# Patient Record
Sex: Female | Born: 1945 | Race: White | Hispanic: No | Smoking: Former smoker
Health system: Southern US, Community
[De-identification: ages and names within clinical notes are randomized; demographics above are authoritative.]

## PROBLEM LIST (undated history)

## (undated) DIAGNOSIS — K829 Disease of gallbladder, unspecified: Secondary | ICD-10-CM

## (undated) DIAGNOSIS — M1990 Unspecified osteoarthritis, unspecified site: Secondary | ICD-10-CM

## (undated) DIAGNOSIS — E785 Hyperlipidemia, unspecified: Secondary | ICD-10-CM

## (undated) DIAGNOSIS — H5440 Blindness, one eye, unspecified eye: Secondary | ICD-10-CM

## (undated) DIAGNOSIS — I639 Cerebral infarction, unspecified: Secondary | ICD-10-CM

## (undated) DIAGNOSIS — I10 Essential (primary) hypertension: Secondary | ICD-10-CM

## (undated) DIAGNOSIS — I359 Nonrheumatic aortic valve disorder, unspecified: Secondary | ICD-10-CM

## (undated) DIAGNOSIS — F32A Depression, unspecified: Secondary | ICD-10-CM

## (undated) DIAGNOSIS — Z8639 Personal history of other endocrine, nutritional and metabolic disease: Secondary | ICD-10-CM

## (undated) DIAGNOSIS — K589 Irritable bowel syndrome without diarrhea: Secondary | ICD-10-CM

## (undated) DIAGNOSIS — IMO0001 Reserved for inherently not codable concepts without codable children: Secondary | ICD-10-CM

## (undated) DIAGNOSIS — R5382 Chronic fatigue, unspecified: Secondary | ICD-10-CM

## (undated) DIAGNOSIS — F329 Major depressive disorder, single episode, unspecified: Secondary | ICD-10-CM

## (undated) DIAGNOSIS — Z531 Procedure and treatment not carried out because of patient's decision for reasons of belief and group pressure: Secondary | ICD-10-CM

## (undated) DIAGNOSIS — K8020 Calculus of gallbladder without cholecystitis without obstruction: Secondary | ICD-10-CM

## (undated) DIAGNOSIS — D75839 Thrombocytosis, unspecified: Secondary | ICD-10-CM

## (undated) DIAGNOSIS — N289 Disorder of kidney and ureter, unspecified: Secondary | ICD-10-CM

## (undated) DIAGNOSIS — D473 Essential (hemorrhagic) thrombocythemia: Secondary | ICD-10-CM

## (undated) DIAGNOSIS — R569 Unspecified convulsions: Secondary | ICD-10-CM

## (undated) DIAGNOSIS — G9332 Myalgic encephalomyelitis/chronic fatigue syndrome: Secondary | ICD-10-CM

## (undated) HISTORY — DX: Disease of gallbladder, unspecified: K82.9

## (undated) HISTORY — DX: Major depressive disorder, single episode, unspecified: F32.9

## (undated) HISTORY — DX: Depression, unspecified: F32.A

## (undated) HISTORY — DX: Calculus of gallbladder without cholecystitis without obstruction: K80.20

## (undated) HISTORY — DX: Irritable bowel syndrome, unspecified: K58.9

## (undated) HISTORY — DX: Personal history of other endocrine, nutritional and metabolic disease: Z86.39

## (undated) HISTORY — PX: TONSILLECTOMY: SUR1361

## (undated) HISTORY — DX: Chronic fatigue, unspecified: R53.82

## (undated) HISTORY — DX: Hyperlipidemia, unspecified: E78.5

## (undated) HISTORY — PX: CARDIAC CATHETERIZATION: SHX172

## (undated) HISTORY — PX: ADENOIDECTOMY: SUR15

## (undated) HISTORY — PX: INTRAOCULAR PROSTHESES INSERTION: SHX360

## (undated) HISTORY — PX: TUBAL LIGATION: SHX77

## (undated) HISTORY — PX: BLADDER SURGERY: SHX569

## (undated) HISTORY — DX: Unspecified osteoarthritis, unspecified site: M19.90

## (undated) HISTORY — DX: Myalgic encephalomyelitis/chronic fatigue syndrome: G93.32

## (undated) HISTORY — PX: EYE SURGERY: SHX253

---

## 1996-05-15 HISTORY — PX: AORTIC VALVE REPLACEMENT: SHX41

## 2005-06-23 ENCOUNTER — Ambulatory Visit: Payer: Self-pay | Admitting: Internal Medicine

## 2005-06-27 ENCOUNTER — Encounter: Payer: Self-pay | Admitting: Cardiology

## 2005-06-27 ENCOUNTER — Ambulatory Visit: Payer: Self-pay

## 2005-06-29 ENCOUNTER — Other Ambulatory Visit: Admission: RE | Admit: 2005-06-29 | Discharge: 2005-06-29 | Payer: Self-pay | Admitting: Gynecology

## 2008-05-05 ENCOUNTER — Ambulatory Visit: Payer: Self-pay | Admitting: Internal Medicine

## 2008-05-25 ENCOUNTER — Encounter: Payer: Self-pay | Admitting: Internal Medicine

## 2008-05-25 ENCOUNTER — Ambulatory Visit: Payer: Self-pay

## 2008-08-28 ENCOUNTER — Telehealth: Payer: Self-pay | Admitting: Cardiology

## 2008-11-16 DIAGNOSIS — Z862 Personal history of diseases of the blood and blood-forming organs and certain disorders involving the immune mechanism: Secondary | ICD-10-CM

## 2008-11-16 DIAGNOSIS — I359 Nonrheumatic aortic valve disorder, unspecified: Secondary | ICD-10-CM | POA: Insufficient documentation

## 2008-11-16 DIAGNOSIS — F329 Major depressive disorder, single episode, unspecified: Secondary | ICD-10-CM

## 2008-11-16 DIAGNOSIS — R5382 Chronic fatigue, unspecified: Secondary | ICD-10-CM

## 2008-11-16 DIAGNOSIS — M129 Arthropathy, unspecified: Secondary | ICD-10-CM | POA: Insufficient documentation

## 2008-11-16 DIAGNOSIS — K589 Irritable bowel syndrome without diarrhea: Secondary | ICD-10-CM

## 2008-11-16 DIAGNOSIS — F3289 Other specified depressive episodes: Secondary | ICD-10-CM | POA: Insufficient documentation

## 2008-11-16 DIAGNOSIS — I10 Essential (primary) hypertension: Secondary | ICD-10-CM | POA: Insufficient documentation

## 2008-11-16 DIAGNOSIS — E785 Hyperlipidemia, unspecified: Secondary | ICD-10-CM | POA: Insufficient documentation

## 2008-11-16 DIAGNOSIS — G40909 Epilepsy, unspecified, not intractable, without status epilepticus: Secondary | ICD-10-CM

## 2008-11-16 DIAGNOSIS — Z8639 Personal history of other endocrine, nutritional and metabolic disease: Secondary | ICD-10-CM

## 2008-11-17 ENCOUNTER — Ambulatory Visit: Payer: Self-pay | Admitting: Cardiology

## 2008-11-30 ENCOUNTER — Ambulatory Visit: Payer: Self-pay | Admitting: Cardiology

## 2008-12-28 ENCOUNTER — Encounter: Payer: Self-pay | Admitting: *Deleted

## 2009-01-06 ENCOUNTER — Encounter (INDEPENDENT_AMBULATORY_CARE_PROVIDER_SITE_OTHER): Payer: Self-pay | Admitting: *Deleted

## 2009-01-13 ENCOUNTER — Encounter: Payer: Self-pay | Admitting: Cardiology

## 2009-02-22 ENCOUNTER — Encounter: Payer: Self-pay | Admitting: Internal Medicine

## 2009-05-12 ENCOUNTER — Telehealth: Payer: Self-pay | Admitting: Cardiology

## 2009-06-25 ENCOUNTER — Telehealth: Payer: Self-pay | Admitting: Cardiology

## 2009-09-20 ENCOUNTER — Telehealth: Payer: Self-pay | Admitting: Cardiology

## 2009-09-24 ENCOUNTER — Ambulatory Visit: Payer: Self-pay | Admitting: Cardiology

## 2009-09-24 ENCOUNTER — Encounter (INDEPENDENT_AMBULATORY_CARE_PROVIDER_SITE_OTHER): Payer: Self-pay | Admitting: *Deleted

## 2009-09-24 DIAGNOSIS — R072 Precordial pain: Secondary | ICD-10-CM | POA: Insufficient documentation

## 2009-09-28 ENCOUNTER — Encounter: Payer: Self-pay | Admitting: Cardiology

## 2009-09-29 ENCOUNTER — Ambulatory Visit: Payer: Self-pay | Admitting: Cardiology

## 2009-09-29 ENCOUNTER — Encounter (HOSPITAL_COMMUNITY): Admission: RE | Admit: 2009-09-29 | Discharge: 2009-09-29 | Payer: Self-pay | Admitting: Cardiology

## 2009-10-19 ENCOUNTER — Telehealth: Payer: Self-pay | Admitting: Cardiology

## 2010-06-12 LAB — CONVERTED CEMR LAB: Prothrombin Time: 29.4 s — ABNORMAL HIGH (ref 10.9–13.3)

## 2010-06-14 NOTE — Letter (Signed)
Summary: Vernonia Treadmill (Nuc Med Stress)  [facility]  [address] [place] [address]   Phone: [phone]  Fax: [phone]    Nuclear Medicine 1-Day Stress Test Information Sheet  Re:     Ana [name]   DOB:     [birth year] MRN:     [id number] Weight:  Appointment Date: Register at: Appointment Time: Referring MD:  ___Exercise Stress  __Adenosine   __Dobutamine  _x_Lexiscan  __Persantine   __Thallium  Urgency: __x__1 (next day)   ____2 (one week)    ____3 (PRN)  Patient will receive Follow Up call with results: Patient needs follow-up appointment:  Instructions regarding medication:  How to prepare for your stress test: 1. DO NOT eat or dring 6 hours prior to your arrival time. This includes no caffeine (coffee, tea, sodas, chocolate) if you were instructed to take your medications, drink water with it. 2. DO NOT use any tobacco products for at leaset 8 hours prior to arrival. 3. DO NOT wear dresses or any clothing that may have metal clasps or buttons. 4. Wear short sleeve shirts, loose clothing, and comfortalbe walking shoes. 5. DO NOT use lotions, oils or powder on your chest before the test. 6. The test will take approximately 3-4 hours from the time you arrive until completion. 7. To register the day of the test, go to the Short Stay entrance at [facility]. 8. If you must cancel your test, call [phone] as soon as you are aware.  After you arrive for test:   When you arrive at [facility], you will go to Short Stay to be registered. They will then send you to Radiology to check in. The Nuclear Medicine Tech will get you and start an IV in your arm or hand. A small amount of a radioactive tracer will then be injected into your IV. This tracer will then have to circulate for 30-45 minutes. During this time you will wait in the waiting room and you will be able to drink something without caffeine. A series of pictures will be taken of  your heart follwoing this waiting period. After the 1st set of pictures you will go to the stress lab to get ready for your stress test. During the stress test, another small amount of a radioactive tracer will be injected through your IV. When the stress test is complete, there is a short rest period while your heart rate and blood pressure will be monitored. When this monitoring period is complete you will have another set of pictrues taken. (The same as the 1st set of pictures). These pictures are taken between 15 minutes and 1 hour after the stress test. The time depends on the type of stress test you had. Your doctor will inform you of your test results within 7 days after test.    The possibilities of certain changes are possible during the test. They include abnormal blood pressure and disorders of the heart. Side effects of persantine or adenosine can include flushing, chest pain, shortness of breath, stomach tightness, headache and light-headedness. These side effects usually do not last long and are self-resolving. Every effort will be made to keep you comfortable and to minimize complications by obtaining a medical history and by close observation during the test. Emergency equipment, medications, and trained personnel are available to deal with any unusual situation which may arise.  Please notify office at least 48 hours in advance if you are unable to keep  this appt.

## 2010-06-14 NOTE — Progress Notes (Signed)
Summary: chestpain   Phone Note Call from Patient Call back at Home Phone [phone] Call back at [phone]   Caller: Patient Reason for Call: Talk to Nurse Details for Reason: chestpain - going down left sided. sometime both sided. Initial call taken by: Lorne Skeens,  Sep 20, 2009 4:48 PM  Follow-up for Phone Call        01/22/10--0820 am--pt calling c/o CP running down L arm and into chest--no N/V--no diaphoresis --has been experiencing off and on for 1 year--pt also states has high levels of metals in body and is beening treated for this condition with an IV washout ? , and needs OK from dr Ahrianna Siglin to have this treatment--advised will attempt to make an appoint with dr Melea Prezioso in [place] 09/21/09--appoint made for pt with dr Darci Lykins  friday 5/13 at 1:30 pm--pt aware--nt--pt will be seen in [place] Follow-up by: Ledon Snare, RN,  Sep 21, 2009 8:35 AM     Appended Document: chestpain   Reviewed Juanito Doom, MD

## 2010-06-14 NOTE — Progress Notes (Signed)
Summary: Pt calling reagrding a letter/**LM/nm  Phone Note Call from Patient Call back at [phone]   Caller: Patient Summary of Call: Pt calling regarding a letter send  letter to: [address] [place] [address] Initial call taken by: Judie Grieve,  October 19, 2009 8:32 AM  Follow-up for Phone Call        [facility]. Ollen Gross, RN, BSN  October 19, 2009 8:55 AM  Pt. called back regarding clearance she reguested several weeks ago. Letter printed from EMR and send to pt.  Follow-up by: Ollen Gross, RN, BSN,  October 19, 2009 9:11 AM

## 2010-06-14 NOTE — Progress Notes (Signed)
Summary: med question  Phone Note Call from Patient Call back at [phone] or [phone]   Caller: Patient Reason for Call: Talk to Nurse Complaint: Earache/Ear Infection Summary of Call: going to have 3 dental procedure soon, can she take triizalam(spelling?) if so [facility] [place] Initial call taken by: Migdalia Dk,  June 25, 2009 9:05 AM  Follow-up for Phone Call        PER PT WANTING MED FOR PROCEDURES WILL CALL DR Maurice March ON MON TO GET CORRECT SPELLING PT INFORMED WILL NOT FILL DENTIST WILL HAVE TO IF OKAY TO HAVE  PER DR Monette Omara DR Maurice March AND [facility] [phone] Follow-up by: Scherrie Bateman, LPN,  June 25, 2009 4:45 PM  Additional Follow-up for Phone Call Additional follow up Details #1::        [facility] AT DENTIST OFFICE Scherrie Bateman, LPN  June 28, 2009 4:37 PM SPOKE WITH DENTAL OFFICE WANTING TO KNOW IF PT MAY TAKE HALICON 0.25MG  PRIOR TO PROCEDURES. Additional Follow-up by: Scherrie Bateman, LPN,  June 29, 2009 9:08 AM    Additional Follow-up for Phone Call Additional follow up Details #2::    OK with me if not allergic. Follow-up by: Gaylord Shih, MD, [facility],  June 29, 2009 2:25 PM  Additional Follow-up for Phone Call Additional follow up Details #3:: Details for Additional Follow-up Action Taken: PT AWARE MAY TAKE HALICON. Additional Follow-up by: Scherrie Bateman, LPN,  July 01, 2009 11:07 AM

## 2010-06-14 NOTE — Assessment & Plan Note (Signed)
Summary: patient having chestpain per nancy terbeck in [place]/sn   Visit Type:  Follow-up Primary Provider:  zack hall  CC:  chest pain with left arm pain.  History of Present Illness: Ana [name] returns today for further evaluation and management of her history of aortic valve replacement for aortic insufficiency, dated 1981, history of chest pain which has been felt not to be coronary based on a most recent stress Myoview in January of 2010, normal left ventricular systolic function, and hypertension.  She's had a long history of noncompliance with her medications. Please alert my last note.  On her last visit I added amlodipine which has helped her blood pressure. She discontinued her metoprolol because she was continuing to have episodic vertigo.  She states that she has heavy levels of mottled and heavy metals and her blood. She wishes to have chelation therapy. The physician involved has asked for cardiac clearance.  Of note, she has had some shoulder aching with radiation down her left arm as well some aching in her chest. It is sporadic and not related to exertion. She denies any nausea vomiting or diaphoresis. She is a very difficult historian.  Clinical Reports Reviewed:  Nuclear Study:  05/25/2008:  Exercise capacity - Good exercise capacity Blood Pressure response  - Hypertensive blood pressure       response Clinical symptoms - No chest pain or dyspnea ECG impression - Less than 1mm ST depression at peak exercise.      T wave inversion in the anterior leads at 5:50 recovery. Overall,       not significant for ischemia Overall Impression  -  Low risk stress nuclear study   Current Medications (verified): 1)  Warfarin Sodium 6 Mg Tabs (Warfarin Sodium) .... As Directed 2)  Multivitamins   Tabs (Multiple Vitamin) .[name].. 1 Tab Once Daily 3)  Amlodipine Besylate 10 Mg Tabs (Amlodipine Besylate) .... Take One Tablet By Mouth Daily  Allergies (verified): No Known Drug  Allergies  Past History:  Past Medical History: Last updated: 11/16/2008 CHRONIC FATIGUE SYNDROME (ICD-780.71) IBS (ICD-564.1) THYROID DISEASE, HX OF (ICD-V12.2) SEIZURE DISORDER (ICD-780.39) HYPERTENSION, UNSPECIFIED (ICD-401.9) HYPERLIPIDEMIA-MIXED (ICD-272.4) DEPRESSION (ICD-311) ARTHRITIS (ICD-716.90) AORTIC INSUFFICIENCY (ICD-424.1)    Past Surgical History: Last updated: 11/16/2008 Tonsillectomy Aortic Valve Replacement -- 1981  Family History: Last updated: 05/27/2008 Father: unknown Mother: Family History of Hypertension  Social History: Last updated: 05/27/2008 Disabled  Divorced  Tobacco Use - No.  Alcohol Use - yes Regular Exercise - no Drug Use - no  Review of Systems       negative other than history of present illness  Vital Signs:  Patient profile:   65 year old female Weight:      152 pounds BMI:     25.39 Pulse rate:   79 / minute BP sitting:   140 / 84  (right arm)  Vitals Entered By: Dreama Saa, CNA (Sep 24, 2009 1:31 PM)  Physical Exam  General:  Well developed, well nourished, in no acute distress. Head:  normocephalic and atraumatic Eyes:  PERRLA/EOM intact; conjunctiva and lids normal. Neck:  Neck supple, no JVD. No masses, thyromegaly or abnormal cervical nodes. Chest Stevens [place]:  no deformities or breast masses noted Lungs:  Clear bilaterally to auscultation and percussion. Heart:  regular rate and rhythm, normal S1 prosthetic S2 with normal split no diastolic murmur appreciated. Carotids are clear Msk:  Back normal, normal gait. Muscle strength and tone normal. Pulses:  pulses normal in all 4 extremities Extremities:  No clubbing or cyanosis. Neurologic:  Alert and oriented x 3. Skin:  Intact without lesions or rashes. Psych:  Normal affect.   Problems:  Medical Problems Added: 1)  Dx of Chest Pain-precordial  (XBJ-478.29)  Impression & Recommendations:  Problem # 1:  CHEST PAIN-PRECORDIAL (ICD-786.51) With new EKG  changes in the anterior precordium, and her symptoms, we will go on a stress Myoview. This must be cleared before clear for any sort of procedure. The following medications were removed from the medication list:    Metoprolol Succinate 50 Mg Xr24h-tab (Metoprolol succinate) .[name]... Take one tablet by mouth daily Her updated medication list for this problem includes:    Warfarin Sodium 6 Mg Tabs (Warfarin sodium) .[name]... As directed    Amlodipine Besylate 10 Mg Tabs (Amlodipine besylate) .[name]... Take one tablet by mouth daily  Problem # 2:  HYPERTENSION, UNSPECIFIED (ICD-401.9) Assessment: Improved  The following medications were removed from the medication list:    Metoprolol Succinate 50 Mg Xr24h-tab (Metoprolol succinate) .[name]... Take one tablet by mouth daily Her updated medication list for this problem includes:    Amlodipine Besylate 10 Mg Tabs (Amlodipine besylate) .[name]... Take one tablet by mouth daily  Orders: Nuclear Stress Test (Nuc Stress Test)  Problem # 3:  AORTIC INSUFFICIENCY (ICD-424.1) Assessment: Improved history of aortic valve replacement, stable echo and exam. The following medications were removed from the medication list:    Metoprolol Succinate 50 Mg Xr24h-tab (Metoprolol succinate) .[name]... Take one tablet by mouth daily  Patient Instructions: 1)  Your physician recommends that you schedule a follow-up appointment in: 1 year 2)  Your physician has requested that you have an lexiscan myoview.  For further information please visit [email or web address]  Please follow instruction sheet, as given.

## 2010-06-14 NOTE — Letter (Signed)
Summary: Clearance Letter  [facility]  [address], [place] [address]   Phone: [phone]  Fax: [phone]    Sep 24, 2009  Re:     Ana [name] Address:   [address]     [place], [place]  [address] DOB:     [birth year] MRN:     [id number]  To Whom It May Concern:   Mrs.Parfitt is a considered a low risk from a cardiac stand point for a   surgical procedure.  She can continue her current medications as   directed.        Sincerely, Dr. Valera Castle MD, [facility]

## 2010-09-27 NOTE — Assessment & Plan Note (Signed)
[facility]                            CARDIOLOGY OFFICE NOTE   NAME:Ana [name], Ana [name]                           MRN:          [id number]  DATE:05/05/2008                            DOB:          1946-03-04    PRIMARY CARE PHYSICIAN:  Dr. Orvan Falconer in [place].  We did not have his  first name.   INTERVAL HISTORY:  Ana [name] is a 65 year old woman with a history of aortic  insufficiency, status post aortic valve replacement.  She had normal  coronaries prior to her valve replacement.  She has had a history of  chest pain and had a dobutamine Cardiolite in September 2003 which was  normal.  Remainder of her medical history is notable for hypertension  and history of a seizure disorder, hyperlipidemia, depression, and  irritable bowel syndrome.   She presents today for evaluation of chest tightness.  She said that she  stopped taking her antihypertensives several weeks ago.  She said she do  not want to put anymore chemicals in her body.  She began to develop  chest tightness.  She went to see Dr. Orvan Falconer.  Her systolic blood  pressure was 229/100.  He admitted her to the hospital, restarted some  of her blood pressure medications, but then she signed out AMA.  She  says that since her blood pressures come under better control, her chest  tightness has resolved.  She denies any orthopnea.  No PND.  No lower  extremity edema.  She has a lot of questions about which medicines she  can get off.   CURRENT MEDICATIONS:  1. Coumadin.  2. Xanax 0.25 p.r.n.  3. Multivitamin.  4. Aspirin 81.  5. Exforge 5/320.  6. Toprol 50 a day.  7. Nattokinase supplements.  8. Glucosamine.   PHYSICAL EXAMINATION:  GENERAL:  She is in no acute distress.  She  ambulates around the clinic without any respiratory difficulty.  VITAL SIGNS:  Blood pressure is 154/84, heart rate is 67, and weight is  165.  HEENT:  Normal.  NECK:  Supple.  There is no JVD.  Carotids are 2+  bilaterally without  any bruits.  There is no lymphadenopathy or thyromegaly.  CARDIAC:  PMI  is nondisplaced.  She has a regular rate and rhythm with a crisp  mechanical S2.  There is a soft 2/6 systolic ejection murmur over the  valve.  LUNGS:  Clear.  ABDOMEN:  Soft, nontender, and nondistended.  No hepatosplenomegaly.  No  bruits.  No masses.  Good bowel sounds.  EXTREMITIES:  Warm with no  cyanosis, clubbing, or edema.  No rash.  NEUROLOGIC:  Alert and oriented x3.  Affect is flat.  Moves all 4  extremities without difficulty.  Cranial nerves are grossly intact.   EKG shows sinus rhythm with a first-degree AV block and nonspecific T-  wave flattening.   ASSESSMENT AND PLAN:  1. Chest pain.  I suspect this was due to her severe hypertension with      markedly elevated left ventricular end diastolic pressure.  This is  now better with control of her blood pressure.  It has been 6 years      since her last stress testing, given her multiple risk factors.  I      do think it is reasonable to pursue followup treadmill Myoview.  2. Aortic valve replacement with a mechanical valve.  This is stable.      She is due for routine echocardiogram.  3. Hypertension.  Blood pressure is much better controlled.  This is      followed by her primary care physician.  I had a long talk with her      about the need for compliance with her medications.   DISPOSITION:  She will return for followup in 1 year if her echo or  stress test are abnormal, we will contact her.     Bevelyn Buckles. Bensimhon, MD  Electronically Signed    DRB/MedQ  DD: 05/05/2008  DT: 05/06/2008  Job #: [id number]

## 2010-11-27 ENCOUNTER — Emergency Department (HOSPITAL_COMMUNITY)
Admission: EM | Admit: 2010-11-27 | Discharge: 2010-11-27 | Disposition: A | Discharge status: Home / Self Care | Payer: Medicare Other | Attending: Emergency Medicine | Admitting: Emergency Medicine

## 2010-11-27 DIAGNOSIS — Z7901 Long term (current) use of anticoagulants: Secondary | ICD-10-CM | POA: Insufficient documentation

## 2010-11-27 DIAGNOSIS — R109 Unspecified abdominal pain: Secondary | ICD-10-CM | POA: Insufficient documentation

## 2010-11-27 DIAGNOSIS — Z79899 Other long term (current) drug therapy: Secondary | ICD-10-CM | POA: Insufficient documentation

## 2010-11-27 DIAGNOSIS — R310 Gross hematuria: Secondary | ICD-10-CM | POA: Insufficient documentation

## 2010-11-27 DIAGNOSIS — N390 Urinary tract infection, site not specified: Secondary | ICD-10-CM | POA: Insufficient documentation

## 2010-11-27 HISTORY — DX: Disorder of kidney and ureter, unspecified: N28.9

## 2010-11-27 HISTORY — DX: Unspecified convulsions: R56.9

## 2010-11-27 LAB — BASIC METABOLIC PANEL
BUN: 20 mg/dL (ref 6–23)
CO2: 34 mEq/L — ABNORMAL HIGH (ref 19–32)
Calcium: 8.9 mg/dL (ref 8.4–10.5)
Chloride: 99 mEq/L (ref 96–112)
Potassium: 4.1 mEq/L (ref 3.5–5.1)
Sodium: 140 mEq/L (ref 135–145)

## 2010-11-27 LAB — URINALYSIS, ROUTINE W REFLEX MICROSCOPIC
Ketones, ur: 15 mg/dL — AB
Protein, ur: >300 mg/dL — AB
pH: 7 (ref 5.0–8.0)

## 2010-11-27 LAB — PROTIME-INR: Prothrombin Time: 29.2 seconds — ABNORMAL HIGH (ref 11.6–15.2)

## 2010-11-27 MED ORDER — NITROFURANTOIN MACROCRYSTAL 100 MG PO CAPS: 100.0000 mg | ORAL_CAPSULE | Freq: Two times a day (BID) | ORAL | Status: DC

## 2010-11-27 MED ORDER — NITROFURANTOIN MACROCRYSTAL 100 MG PO CAPS
100.0000 mg | ORAL_CAPSULE | Freq: Once | ORAL | Status: AC
  Administered 2010-11-27: 100 mg via ORAL
  Filled 2010-11-27: qty 1

## 2010-11-27 MED ORDER — NITROFURANTOIN MONOHYD MACRO 100 MG PO CAPS: 100.0000 mg | ORAL_CAPSULE | Freq: Two times a day (BID) | ORAL | Status: DC

## 2010-11-27 NOTE — ED Notes (Signed)
Patient with no complaints at this time. Respirations even and unlabored. Skin warm/dry. Discharge instructions reviewed with patient at this time. Patient given opportunity to voice concerns/ask questions. Patient discharged at this time and left Emergency Department with steady gait.   

## 2010-11-27 NOTE — ED Notes (Signed)
I assumed care of patient at this time. Report received from Chandra Batch, Charity fundraiser.

## 2010-11-27 NOTE — ED Provider Notes (Signed)
History     Chief Complaint  Patient presents with  . Hematuria   HPI Comments: She reports history of kidney stones,  And did have some mild right flank pain several days ago which has resolved,  But she continues to have hematuria.  She takes apple cider vinegar and a calcium supplement to minimize kidney stone formation.  She stopped taking her coumadin 2 days ago due to hematuria.  She also added colloidal silver and oil of oregano which she reports to be good for generalized infection.  She is without complaint except for continued hematuria.  Patient is a 65 y.o. female presenting with hematuria. The history is provided by the patient.  Hematuria This is a new problem. The current episode started yesterday. The problem is unchanged. She describes the hematuria as gross hematuria. The hematuria occurs throughout her entire urinary stream. She reports no clotting in her urine stream. Her pain is at a severity of 0/10. She is experiencing no pain. She describes her urine color as dark red. Irritative symptoms do not include frequency, nocturia or urgency. Obstructive symptoms do not include dribbling or straining. Associated symptoms include flank pain. Pertinent negatives include no abdominal pain, dysuria, fever or nausea. Her past medical history is significant for kidney stones.    Past Medical History  Diagnosis Date  . Renal disorder   . Hypertension   . Seizures     Past Surgical History  Procedure Date  . Aortic valve replacement   . Tubal ligation   . Bladder surgery     History reviewed. No pertinent family history.  History  Substance Use Topics  . Smoking status: Never Smoker   . Smokeless tobacco: Not on file  . Alcohol Use: No    OB History    Grav Para Term Preterm Abortions TAB SAB Ect Mult Living                  Review of Systems  Constitutional: Negative for fever.  HENT: Negative for nosebleeds, congestion, sore throat and neck pain.   Eyes:  Negative.   Respiratory: Negative for chest tightness and shortness of breath.   Cardiovascular: Negative for chest pain.  Gastrointestinal: Negative for nausea and abdominal pain.  Genitourinary: Positive for hematuria and flank pain. Negative for dysuria, urgency, frequency and nocturia.  Musculoskeletal: Negative for joint swelling and arthralgias.  Skin: Negative.  Negative for rash and wound.  Neurological: Negative for dizziness, weakness, light-headedness, numbness and headaches.  Hematological: Negative.   Psychiatric/Behavioral: Negative.     Physical Exam  BP 129/64  Pulse 91  Temp(Src) 98.5 F (36.9 C) (Oral)  Resp 18  Ht 5\' 4"  (1.626 m)  Wt 146 lb (66.225 kg)  BMI 25.06 kg/m2  SpO2 98%  Physical Exam  Vitals reviewed. Constitutional: She is oriented to person, place, and time. She appears well-developed and well-nourished.  HENT:  Head: Normocephalic and atraumatic.  Eyes: Conjunctivae are normal.  Neck: Normal range of motion.  Cardiovascular: Normal rate, regular rhythm, normal heart sounds and intact distal pulses.   Pulmonary/Chest: Effort normal and breath sounds normal. She has no wheezes.  Abdominal: Soft. Bowel sounds are normal. There is no tenderness.  Musculoskeletal: Normal range of motion.  Neurological: She is alert and oriented to person, place, and time.  Skin: Skin is warm and dry.  Psychiatric: She has a normal mood and affect.    ED Course  Procedures  MDM       Raynelle Fanning  L Hazelynn Mckenny, PA 11/27/10 1320

## 2010-11-27 NOTE — ED Notes (Signed)
Pt reports hematuria for the past 3 days.  Pt reports hx of kidney stones.   States that she had rt flank pain initially, but that has subsided.  Pt is on coumadin and is concerned about the amt of blood in her urine.  nad noted

## 2010-11-27 NOTE — ED Notes (Signed)
Lab notified of urine culture ordered.

## 2010-11-27 NOTE — ED Notes (Signed)
Pt presents with c/o blood in urine. Pt states prior to blood in urine pt was having  Right sided kidney pain. Pt denies any other urinary symptoms and pain has since subsided.

## 2010-11-28 LAB — URINE CULTURE
Culture  Setup Time: 201207152053
Culture: NO GROWTH

## 2010-12-26 NOTE — ED Provider Notes (Signed)
History     CSN: [id number] Arrival date & time: 11/27/2010 10:11 AM  Chief Complaint  Patient presents with  . Hematuria   HPI  Past Medical History  Diagnosis Date  . Renal disorder   . Hypertension   . Seizures     Past Surgical History  Procedure Date  . Aortic valve replacement   . Tubal ligation   . Bladder surgery     History reviewed. No pertinent family history.  History  Substance Use Topics  . Smoking status: Never Smoker   . Smokeless tobacco: Not on file  . Alcohol Use: No    OB History    Grav Para Term Preterm Abortions TAB SAB Ect Mult Living                  Review of Systems  Physical Exam  BP 129/64  Pulse 91  Temp(Src) 98.5 F (36.9 C) (Oral)  Resp 18  Ht 5\' 4"  (1.626 m)  Wt 146 lb (66.225 kg)  BMI 25.06 kg/m2  SpO2 98%  Physical Exam  ED Course  Procedures  MDM  Evaluation and management procedures were performed by the PA/NP under my supervision/collaboration.       Felisa Bonier, MD 12/26/10 [phone]

## 2011-02-15 ENCOUNTER — Encounter: Payer: Self-pay | Admitting: Cardiology

## 2011-05-04 ENCOUNTER — Telehealth: Payer: Self-pay | Admitting: Cardiology

## 2011-05-04 NOTE — Telephone Encounter (Signed)
New problem:   Patient need to stop coumadin due to surgery on 12/2. Fax # [phone].

## 2011-05-04 NOTE — Telephone Encounter (Signed)
LMTCB with Marchelle Folks. Receptionist states pt is having a vitrectomy Last OV 09/2009  Mylo Red RN

## 2011-05-05 ENCOUNTER — Telehealth: Payer: Self-pay | Admitting: Cardiology

## 2011-05-05 NOTE — Telephone Encounter (Signed)
Fu call Pt wants to verify when she needs to stop coumadin and does she need to take antibiotic before surgery Please call

## 2011-05-05 NOTE — Telephone Encounter (Signed)
Please call dr Allyne Gee re pt stopping her coumadin due to eye surgery

## 2011-05-05 NOTE — Telephone Encounter (Signed)
Patient called wanting to make sure when to hold coumadin,was told by Dr. Allyne Gee office it was ok to hold coumadin starting tonight.States eye surgery scheduled 05/11/11.Checked with DOD Dr. Antoine Poche and he advised to continue coumadin and check with Dr.Wall.Dr. Daleen Squibb paged twice,no answer.Patient also wanting to know if she needs to take an antibiotic.Patient advised to continue coumadin and we will check with Dr. Daleen Squibb on Monday 05/08/11.

## 2011-05-05 NOTE — Telephone Encounter (Signed)
Per Dr Daleen Squibb pt can hold coumadin for eye surgery and there is no need for an appointment

## 2011-05-08 MED ORDER — AMOXICILLIN 500 MG PO CAPS: ORAL_CAPSULE | ORAL | Status: DC

## 2011-05-08 NOTE — Telephone Encounter (Signed)
Fu call °Pt returning your call  °

## 2011-05-08 NOTE — Telephone Encounter (Signed)
Patient was called back.Patient states she needs to have left eye surgery this thurs 05/11/11. States has a blood clot behind left eye that needs to be removed asap.Spoke with DOD Dr. Clifton James he advised to do like Dr. Daleen Squibb said, hold coumadin as previously recommended and we will call in Amoxiciiln to [facility] [place].

## 2011-05-08 NOTE — Telephone Encounter (Signed)
Patient called no answer.LMTC. 

## 2011-05-11 ENCOUNTER — Other Ambulatory Visit (HOSPITAL_COMMUNITY)
Admission: RE | Admit: 2011-05-11 | Discharge: 2011-05-11 | Disposition: A | Discharge status: Home / Self Care | Payer: Medicare Other | Source: Ambulatory Visit | Attending: Ophthalmology | Admitting: Ophthalmology

## 2011-05-11 DIAGNOSIS — H4310 Vitreous hemorrhage, unspecified eye: Secondary | ICD-10-CM | POA: Insufficient documentation

## 2011-07-17 DIAGNOSIS — R109 Unspecified abdominal pain: Secondary | ICD-10-CM | POA: Diagnosis not present

## 2011-07-17 DIAGNOSIS — R5383 Other fatigue: Secondary | ICD-10-CM | POA: Diagnosis not present

## 2011-07-17 DIAGNOSIS — R5381 Other malaise: Secondary | ICD-10-CM | POA: Diagnosis not present

## 2011-07-17 DIAGNOSIS — I10 Essential (primary) hypertension: Secondary | ICD-10-CM | POA: Diagnosis not present

## 2011-07-26 DIAGNOSIS — R6889 Other general symptoms and signs: Secondary | ICD-10-CM | POA: Diagnosis not present

## 2011-07-26 DIAGNOSIS — R5381 Other malaise: Secondary | ICD-10-CM | POA: Diagnosis not present

## 2011-08-07 DIAGNOSIS — H43819 Vitreous degeneration, unspecified eye: Secondary | ICD-10-CM | POA: Diagnosis not present

## 2011-08-07 DIAGNOSIS — H4310 Vitreous hemorrhage, unspecified eye: Secondary | ICD-10-CM | POA: Diagnosis not present

## 2011-08-07 DIAGNOSIS — H34239 Retinal artery branch occlusion, unspecified eye: Secondary | ICD-10-CM | POA: Diagnosis not present

## 2011-08-07 DIAGNOSIS — H348392 Tributary (branch) retinal vein occlusion, unspecified eye, stable: Secondary | ICD-10-CM | POA: Diagnosis not present

## 2011-08-22 DIAGNOSIS — H348392 Tributary (branch) retinal vein occlusion, unspecified eye, stable: Secondary | ICD-10-CM | POA: Diagnosis not present

## 2011-08-22 DIAGNOSIS — H34239 Retinal artery branch occlusion, unspecified eye: Secondary | ICD-10-CM | POA: Diagnosis not present

## 2011-08-22 DIAGNOSIS — H4310 Vitreous hemorrhage, unspecified eye: Secondary | ICD-10-CM | POA: Diagnosis not present

## 2011-08-22 DIAGNOSIS — H43819 Vitreous degeneration, unspecified eye: Secondary | ICD-10-CM | POA: Diagnosis not present

## 2011-10-06 DIAGNOSIS — H348392 Tributary (branch) retinal vein occlusion, unspecified eye, stable: Secondary | ICD-10-CM | POA: Diagnosis not present

## 2011-10-06 DIAGNOSIS — H34239 Retinal artery branch occlusion, unspecified eye: Secondary | ICD-10-CM | POA: Diagnosis not present

## 2011-10-06 DIAGNOSIS — H43819 Vitreous degeneration, unspecified eye: Secondary | ICD-10-CM | POA: Diagnosis not present

## 2011-10-06 DIAGNOSIS — H4310 Vitreous hemorrhage, unspecified eye: Secondary | ICD-10-CM | POA: Diagnosis not present

## 2012-02-12 DIAGNOSIS — D649 Anemia, unspecified: Secondary | ICD-10-CM | POA: Diagnosis not present

## 2012-02-12 DIAGNOSIS — R5383 Other fatigue: Secondary | ICD-10-CM | POA: Diagnosis not present

## 2012-02-12 DIAGNOSIS — I10 Essential (primary) hypertension: Secondary | ICD-10-CM | POA: Diagnosis not present

## 2012-02-12 DIAGNOSIS — E559 Vitamin D deficiency, unspecified: Secondary | ICD-10-CM | POA: Diagnosis not present

## 2012-02-12 DIAGNOSIS — D518 Other vitamin B12 deficiency anemias: Secondary | ICD-10-CM | POA: Diagnosis not present

## 2012-02-12 DIAGNOSIS — N951 Menopausal and female climacteric states: Secondary | ICD-10-CM | POA: Diagnosis not present

## 2012-03-14 DIAGNOSIS — R6889 Other general symptoms and signs: Secondary | ICD-10-CM | POA: Diagnosis not present

## 2012-03-14 DIAGNOSIS — R5383 Other fatigue: Secondary | ICD-10-CM | POA: Diagnosis not present

## 2012-03-18 DIAGNOSIS — Z7901 Long term (current) use of anticoagulants: Secondary | ICD-10-CM | POA: Diagnosis not present

## 2012-04-17 DIAGNOSIS — IMO0002 Reserved for concepts with insufficient information to code with codable children: Secondary | ICD-10-CM | POA: Diagnosis not present

## 2012-04-17 DIAGNOSIS — H348392 Tributary (branch) retinal vein occlusion, unspecified eye, stable: Secondary | ICD-10-CM | POA: Diagnosis not present

## 2012-04-17 DIAGNOSIS — H43819 Vitreous degeneration, unspecified eye: Secondary | ICD-10-CM | POA: Diagnosis not present

## 2012-04-17 DIAGNOSIS — H34239 Retinal artery branch occlusion, unspecified eye: Secondary | ICD-10-CM | POA: Diagnosis not present

## 2012-04-30 ENCOUNTER — Telehealth: Payer: Self-pay | Admitting: Cardiology

## 2012-04-30 DIAGNOSIS — Z952 Presence of prosthetic heart valve: Secondary | ICD-10-CM

## 2012-04-30 NOTE — Telephone Encounter (Signed)
In that case, prescribe ampicillin if  not allergic to penicillin. She should receive 2 g by mouth at hour before the procedure. Double check this with SBE prophylaxis with Dr. Abner Greenspan.

## 2012-04-30 NOTE — Telephone Encounter (Signed)
Told pt Dr Ronni Rumble not here today and will forward note and they will call within the next two days, pt aware.

## 2012-04-30 NOTE — Telephone Encounter (Signed)
It does not appear as seen this lady in several years. She is to go to her primary care physician for the antibiotics. She could also get them  from her dentist. She needs to reestablish with [name] for cardiology care. If I mistaken based on her records, please tell me what her problem is that requires antibiotic prophylaxis and will be glad to try to help her.

## 2012-04-30 NOTE — Telephone Encounter (Signed)
Pt was last seen 09/24/2009 notes in centricity. Pt has hx of AVR for aortic insufficiency dated 1981 per note. Last echo 05/25/2008

## 2012-04-30 NOTE — Telephone Encounter (Signed)
Pt to  have several dental procedures , four or five, needs abx to take prior, uses [facility], pls call pt when/if done [phone]

## 2012-05-01 ENCOUNTER — Telehealth: Payer: Self-pay | Admitting: *Deleted

## 2012-05-01 MED ORDER — AMOXICILLIN 500 MG PO CAPS: ORAL_CAPSULE | ORAL | Status: DC

## 2012-05-01 NOTE — Telephone Encounter (Signed)
Follow-up:    Patient returned your call.  Please call back. 

## 2012-05-01 NOTE — Telephone Encounter (Signed)
Lmtcb.  Prescription of Amoxcilin 2 gm e-scribed to her pharmacy. Mylo Red RN

## 2012-05-01 NOTE — Telephone Encounter (Signed)
Pt aware of antibiotic at her pharmacy Has not seen Dr. Daleen Squibb since 09/2009.  Appointment made for pt at the [place] office in January 2014 Mylo Red RN

## 2012-05-01 NOTE — Telephone Encounter (Signed)
Pt calls while at the pharmacy. States she is having 5 -6 separate dental procedures done due to crowns & root canal. This was not originally communicated. Spoke with pharmacist and additional antibiotics capsules added to original e-scribe. Total of 24 capsules for 6 separate procedures. Mylo Red RN

## 2012-05-11 ENCOUNTER — Emergency Department (HOSPITAL_COMMUNITY): Payer: Medicare Other

## 2012-05-11 ENCOUNTER — Encounter (HOSPITAL_COMMUNITY): Payer: Self-pay

## 2012-05-11 ENCOUNTER — Emergency Department (HOSPITAL_COMMUNITY)
Admission: EM | Admit: 2012-05-11 | Discharge: 2012-05-11 | Disposition: A | Discharge status: Home / Self Care | Payer: Medicare Other | Attending: Emergency Medicine | Admitting: Emergency Medicine

## 2012-05-11 DIAGNOSIS — Z8679 Personal history of other diseases of the circulatory system: Secondary | ICD-10-CM | POA: Diagnosis not present

## 2012-05-11 DIAGNOSIS — X500XXA Overexertion from strenuous movement or load, initial encounter: Secondary | ICD-10-CM | POA: Insufficient documentation

## 2012-05-11 DIAGNOSIS — R5382 Chronic fatigue, unspecified: Secondary | ICD-10-CM | POA: Diagnosis not present

## 2012-05-11 DIAGNOSIS — I10 Essential (primary) hypertension: Secondary | ICD-10-CM | POA: Diagnosis not present

## 2012-05-11 DIAGNOSIS — M79609 Pain in unspecified limb: Secondary | ICD-10-CM | POA: Diagnosis not present

## 2012-05-11 DIAGNOSIS — H5460 Unqualified visual loss, one eye, unspecified: Secondary | ICD-10-CM | POA: Insufficient documentation

## 2012-05-11 DIAGNOSIS — Y9389 Activity, other specified: Secondary | ICD-10-CM | POA: Insufficient documentation

## 2012-05-11 DIAGNOSIS — Y92009 Unspecified place in unspecified non-institutional (private) residence as the place of occurrence of the external cause: Secondary | ICD-10-CM | POA: Insufficient documentation

## 2012-05-11 DIAGNOSIS — Z862 Personal history of diseases of the blood and blood-forming organs and certain disorders involving the immune mechanism: Secondary | ICD-10-CM | POA: Diagnosis not present

## 2012-05-11 DIAGNOSIS — E785 Hyperlipidemia, unspecified: Secondary | ICD-10-CM | POA: Diagnosis not present

## 2012-05-11 DIAGNOSIS — S99919A Unspecified injury of unspecified ankle, initial encounter: Secondary | ICD-10-CM | POA: Diagnosis not present

## 2012-05-11 DIAGNOSIS — S8253XA Displaced fracture of medial malleolus of unspecified tibia, initial encounter for closed fracture: Secondary | ICD-10-CM | POA: Insufficient documentation

## 2012-05-11 DIAGNOSIS — N289 Disorder of kidney and ureter, unspecified: Secondary | ICD-10-CM | POA: Insufficient documentation

## 2012-05-11 DIAGNOSIS — Z8739 Personal history of other diseases of the musculoskeletal system and connective tissue: Secondary | ICD-10-CM | POA: Diagnosis not present

## 2012-05-11 DIAGNOSIS — Z7901 Long term (current) use of anticoagulants: Secondary | ICD-10-CM | POA: Insufficient documentation

## 2012-05-11 DIAGNOSIS — Z8719 Personal history of other diseases of the digestive system: Secondary | ICD-10-CM | POA: Insufficient documentation

## 2012-05-11 DIAGNOSIS — M7730 Calcaneal spur, unspecified foot: Secondary | ICD-10-CM | POA: Diagnosis not present

## 2012-05-11 DIAGNOSIS — Z8639 Personal history of other endocrine, nutritional and metabolic disease: Secondary | ICD-10-CM | POA: Insufficient documentation

## 2012-05-11 DIAGNOSIS — G9332 Myalgic encephalomyelitis/chronic fatigue syndrome: Secondary | ICD-10-CM | POA: Insufficient documentation

## 2012-05-11 DIAGNOSIS — S99929A Unspecified injury of unspecified foot, initial encounter: Secondary | ICD-10-CM | POA: Diagnosis not present

## 2012-05-11 HISTORY — DX: Blindness, one eye, unspecified eye: H54.40

## 2012-05-11 MED ORDER — OXYCODONE-ACETAMINOPHEN 5-325 MG PO TABS
1.0000 | ORAL_TABLET | Freq: Once | ORAL | Status: AC
  Administered 2012-05-11: 1 via ORAL
  Filled 2012-05-11: qty 1

## 2012-05-11 MED ORDER — OXYCODONE-ACETAMINOPHEN 5-325 MG PO TABS: 1.0000 | ORAL_TABLET | ORAL | Status: AC | PRN

## 2012-05-11 NOTE — ED Provider Notes (Signed)
History     CSN: [id number]  Arrival date & time 05/11/12  1524   First MD Initiated Contact with Patient 05/11/12 1619      Chief Complaint  Patient presents with  . Foot Pain    (Consider location/radiation/quality/duration/timing/severity/associated sxs/prior treatment) HPI Comments: Patient c/o pain to her right foot and ankle for one week after she fell backwards , twisting her ankle.  States she was knealling down on one knee when she fell back.  C/o persistent pain and swelling to her ankle and inability to bear weight to her foot due to level of pain.  She denies other injuries, numbness, weakness or proximal tenderness.  Patient is a 66 y.o. female presenting with ankle pain. The history is provided by the patient.  Ankle Pain  The incident occurred more than 1 week ago. The incident occurred at home. The injury mechanism was torsion. The pain is present in the right ankle and right foot. The quality of the pain is described as aching and throbbing. The pain is moderate. The pain has been constant since onset. Associated symptoms include inability to bear weight. Pertinent negatives include no numbness, no loss of motion, no muscle weakness, no loss of sensation and no tingling. She reports no foreign bodies present. The symptoms are aggravated by activity, bearing weight and palpation. She has tried elevation for the symptoms. The treatment provided no relief.    Past Medical History  Diagnosis Date  . Renal disorder   . Hypertension   . Seizures   . Chronic fatigue syndrome   . IBS (irritable bowel syndrome)   . History of thyroid disease   . Hyperlipidemia   . Depression   . Arthritis   . Aortic insufficiency   . Blind left eye     Past Surgical History  Procedure Date  . Aortic valve replacement   . Tubal ligation   . Bladder surgery   . Eye surgery     Family History  Problem Relation Age of Onset  . Hypertension Mother     History  Substance Use  Topics  . Smoking status: Never Smoker   . Smokeless tobacco: Not on file  . Alcohol Use: Yes     Comment: occ    OB History    Grav Para Term Preterm Abortions TAB SAB Ect Mult Living                  Review of Systems  Constitutional: Negative for fever and chills.  Genitourinary: Negative for dysuria and difficulty urinating.  Musculoskeletal: Positive for joint swelling and arthralgias. Negative for back pain.  Skin: Negative for color change and wound.  Neurological: Negative for dizziness, tingling, light-headedness and numbness.  All other systems reviewed and are negative.    Allergies  Review of patient's allergies indicates no known allergies.  Home Medications   Current Outpatient Rx  Name  Route  Sig  Dispense  Refill  . AMLODIPINE BESYLATE 10 MG PO TABS   Oral   Take 10 mg by mouth daily.           . AMOXICILLIN 500 MG PO CAPS      Take 4 tablets one hour before surgery   4 capsule   0   . NALTREXONE HCL 50 MG PO TABS   Oral   Take 25 mg by mouth.           . WARFARIN SODIUM 10 MG PO TABS   Oral  Take 10 mg by mouth 3 (three) times a week.           . WARFARIN SODIUM 7.5 MG PO TABS   Oral   Take 7.5 mg by mouth 4 (four) times a week.             BP 153/70  Pulse 88  Temp 98.4 F (36.9 C) (Oral)  Resp 18  SpO2 100%  Physical Exam  Nursing note and vitals reviewed. Constitutional: She is oriented to person, place, and time. She appears well-developed and well-nourished. No distress.  HENT:  Head: Normocephalic and atraumatic.  Neck: Normal range of motion. Neck supple.  Cardiovascular: Normal rate, regular rhythm, normal heart sounds and intact distal pulses.   Pulmonary/Chest: Effort normal and breath sounds normal.  Musculoskeletal: She exhibits edema and tenderness.       Right ankle is ttp, mild to moderate STS is present.  Medial bruising also present. DP pulse is brisk, sensation intact.  No erythema, abrasion, or bony  deformity.    Neurological: She is alert and oriented to person, place, and time. She exhibits normal muscle tone. Coordination normal.  Skin: Skin is warm and dry.    ED Course  Procedures (including critical care time)  Labs Reviewed - No data to display Dg Ankle Complete Right  05/11/2012  *RADIOLOGY REPORT*  Clinical Data: Foot and ankle pain.  Bruising.  RIGHT ANKLE - COMPLETE 3+ VIEW  Comparison: 05/11/2012  Findings: There is soft tissue swelling, especially along the lateral aspect of the ankle.  Minimal linear lucency identified along the medial malleolus, raising question of a nondisplaced fracture in this region.  Clinical correlation is suggested regarding point tenderness.  A small plantar calcaneal spurs present.  IMPRESSION:  1.  Soft tissue swelling. 2.  Question of a minimally displaced medial malleolar fracture.   Original Report Authenticated By: Norva Pavlov, M.D.    Dg Foot Complete Right  05/11/2012  *RADIOLOGY REPORT*  Clinical Data: Foot pain.  Fell 6 days ago.  Bruising, swelling.  RIGHT FOOT COMPLETE - 3+ VIEW  Comparison: None.  Findings: There is no evidence for acute fracture or dislocation. No soft tissue foreign body or gas identified.  IMPRESSION: Negative exam.   Original Report Authenticated By: Norva Pavlov, M.D.       ASO splint applied, pain improved, remains NV intact  MDM    Diffuse ttp of the right ankle with moderate STS and medial bruising.  Pt agrees to f/u with Dr. Romeo Apple.  Will prescribe a walker.   Prescribed: Percocet # [phone]   Zubin Pontillo L. [place], [place] 05/13/12 2043

## 2012-05-11 NOTE — ED Notes (Signed)
Pt stated she tripped and fell 1 week ago injuring right foot, cont. To have pain to that area.

## 2012-05-11 NOTE — ED Notes (Signed)
Pt c/o right foot/ankle pain x6 days after a fall. Pt presents swelling and bruising to foot and ankle. Pt was ambulatory with pain and limp.

## 2012-05-13 NOTE — ED Provider Notes (Signed)
Medical screening examination/treatment/procedure(s) were performed by non-physician practitioner and as supervising physician I was immediately available for consultation/collaboration.  Ayumi Wangerin, MD 05/13/12 2048 

## 2012-05-29 ENCOUNTER — Ambulatory Visit (INDEPENDENT_AMBULATORY_CARE_PROVIDER_SITE_OTHER): Payer: Medicare Other | Admitting: Cardiology

## 2012-05-29 ENCOUNTER — Encounter: Payer: Self-pay | Admitting: Cardiology

## 2012-05-29 VITALS — BP 144/89 | HR 83 | Ht 64.5 in | Wt 155.0 lb

## 2012-05-29 DIAGNOSIS — Z952 Presence of prosthetic heart valve: Secondary | ICD-10-CM

## 2012-05-29 DIAGNOSIS — I10 Essential (primary) hypertension: Secondary | ICD-10-CM

## 2012-05-29 DIAGNOSIS — I509 Heart failure, unspecified: Secondary | ICD-10-CM | POA: Insufficient documentation

## 2012-05-29 DIAGNOSIS — R072 Precordial pain: Secondary | ICD-10-CM

## 2012-05-29 DIAGNOSIS — I359 Nonrheumatic aortic valve disorder, unspecified: Secondary | ICD-10-CM

## 2012-05-29 DIAGNOSIS — Z954 Presence of other heart-valve replacement: Secondary | ICD-10-CM

## 2012-05-29 NOTE — Assessment & Plan Note (Signed)
Status post remote metal prosthetic aortic valve replacement. She is stable by history and exam. Continue Coumadin with careful monitoring by primary care. We'll see her back on an annual basis. No indication for an echocardiogram.

## 2012-05-29 NOTE — Assessment & Plan Note (Signed)
She does not have congestive heart failure by history and exam. She may have had this prior to her aortic valve replacement and  the diagnosis has been carried forward. This problem should be removed from her problem list.

## 2012-05-29 NOTE — Patient Instructions (Addendum)
Your physician recommends that you schedule a follow-up appointment in 1 YEAR.  

## 2012-05-29 NOTE — Progress Notes (Signed)
HPI Ana [name] is in today for an overdue evaluation and and management of her history of prosthetic aortic valve replacement. She called in for antibiotics for dental procedure and we noticed we have not seen her in some time.  She carries a diagnosis of heavy metal disease, unspecified, and receives chelation  therapy in [name]. She has her blood drawn there and sent to Dr. Margo Aye. He is monitoring her Coumadin.  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 She states that she bled into her eye but I have no records. She reports no bleeding. Her protimes been very stable. Per her report.  She's been having some atypical chest pain and left shoulder discomfort. It occurs randomly. It is not associated with activity or other symptoms.  Past Medical History  Diagnosis Date  . Renal disorder   . Hypertension   . Seizures   . Chronic fatigue syndrome   . IBS (irritable bowel syndrome)   . History of thyroid disease   . Hyperlipidemia   . Depression   . Arthritis   . Aortic insufficiency   . Blind left eye     Current Outpatient Prescriptions  Medication Sig Dispense Refill  . amLODipine (NORVASC) 10 MG tablet Take 10 mg by mouth daily.        [name] amoxicillin (AMOXIL) 500 MG capsule Take 4 tablets one hour before surgery  4 capsule  0  . warfarin (COUMADIN) 1 MG tablet Take 2 mg by mouth daily. Take with 6mg  for a total of 8mg  on tues,thurs.,sat.,sun.      . warfarin (COUMADIN) 10 MG tablet Take 10 mg by mouth 3 (three) times a week. Mon,wed,fri        No Known Allergies  Family History  Problem Relation Age of Onset  . Hypertension Mother     History   Social History  . Marital Status: Divorced    Spouse Name: N/A    Number of Children: N/A  . Years of Education: N/A   Occupational History  . Disabled    Social History Main Topics  . Smoking status: Never Smoker   . Smokeless tobacco: Not on file  . Alcohol Use: Yes     Comment: occ  . Drug Use: No  .  Sexually Active: No   Other Topics Concern  . Not on file   Social History Narrative   DivorcedNo regular exercise    ROS ALL NEGATIVE EXCEPT THOSE NOTED IN HPI  PE  General Appearance: well developed, well nourished in no acute distress HEENT: symmetrical face, PERRLA, good dentition  Neck: no JVD, thyromegaly, or adenopathy, trachea midline Chest: symmetric without deformity Cardiac: PMI non-displaced, RRR, normal S1, metal prosthetic S2 splits, no diastolic component, no gallop or murmur Lung: clear to ausculation and percussion Vascular: all pulses full without bruits  Abdominal: nondistended, nontender, good bowel sounds, no HSM, no bruits Extremities: no cyanosis, clubbing or edema, no sign of DVT, no varicosities  Skin: normal color, no rashes Neuro: alert and oriented x 3, non-focal Pysch: normal affect  EKG Normal sinus rhythm with low voltage. PR interval is upper limits of normal. BMET    Component Value Date/Time   NA 140 11/27/2010 1037   K 4.1 11/27/2010 1037   CL 99 11/27/2010 1037   CO2 34* 11/27/2010 1037   GLUCOSE 120* 11/27/2010 1037   BUN 20 11/27/2010 1037   CREATININE 0.65 11/27/2010 1037   CALCIUM 8.9 11/27/2010 1037  GFRNONAA >60 11/27/2010 1037   GFRAA >60 11/27/2010 1037    Lipid Panel  No results found for this basename: chol, trig, hdl, cholhdl, vldl, ldlcalc    CBC No results found for this basename: wbc, rbc, hgb, hct, plt, mcv, mch, mchc, rdw, neutrabs, lymphsabs, monoabs, eosabs, basosabs

## 2012-05-29 NOTE — Assessment & Plan Note (Signed)
Historically, this does not sound cardiac. EKG unremarkable. Reassurance given.

## 2012-08-07 DIAGNOSIS — I10 Essential (primary) hypertension: Secondary | ICD-10-CM | POA: Diagnosis not present

## 2012-08-07 DIAGNOSIS — Z7901 Long term (current) use of anticoagulants: Secondary | ICD-10-CM | POA: Diagnosis not present

## 2012-08-07 DIAGNOSIS — G520 Disorders of olfactory nerve: Secondary | ICD-10-CM | POA: Diagnosis not present

## 2012-08-13 ENCOUNTER — Other Ambulatory Visit (HOSPITAL_COMMUNITY): Payer: Self-pay | Admitting: Family Medicine

## 2012-08-13 DIAGNOSIS — R5381 Other malaise: Secondary | ICD-10-CM | POA: Diagnosis not present

## 2012-08-13 DIAGNOSIS — E049 Nontoxic goiter, unspecified: Secondary | ICD-10-CM

## 2012-08-15 ENCOUNTER — Ambulatory Visit (HOSPITAL_COMMUNITY)
Admission: RE | Admit: 2012-08-15 | Discharge: 2012-08-15 | Disposition: A | Discharge status: Home / Self Care | Payer: Medicare Other | Source: Ambulatory Visit | Attending: Family Medicine | Admitting: Family Medicine

## 2012-08-15 DIAGNOSIS — E049 Nontoxic goiter, unspecified: Secondary | ICD-10-CM | POA: Insufficient documentation

## 2012-08-15 DIAGNOSIS — E042 Nontoxic multinodular goiter: Secondary | ICD-10-CM | POA: Diagnosis not present

## 2012-08-19 DIAGNOSIS — Z7901 Long term (current) use of anticoagulants: Secondary | ICD-10-CM | POA: Diagnosis not present

## 2012-08-19 DIAGNOSIS — Z79899 Other long term (current) drug therapy: Secondary | ICD-10-CM | POA: Diagnosis not present

## 2012-10-16 DIAGNOSIS — H34239 Retinal artery branch occlusion, unspecified eye: Secondary | ICD-10-CM | POA: Diagnosis not present

## 2012-10-16 DIAGNOSIS — H348392 Tributary (branch) retinal vein occlusion, unspecified eye, stable: Secondary | ICD-10-CM | POA: Diagnosis not present

## 2012-12-09 DIAGNOSIS — R5381 Other malaise: Secondary | ICD-10-CM | POA: Diagnosis not present

## 2013-03-11 ENCOUNTER — Other Ambulatory Visit (HOSPITAL_COMMUNITY): Payer: Self-pay | Admitting: Family Medicine

## 2013-03-11 DIAGNOSIS — E049 Nontoxic goiter, unspecified: Secondary | ICD-10-CM | POA: Diagnosis not present

## 2013-03-11 DIAGNOSIS — R5381 Other malaise: Secondary | ICD-10-CM | POA: Diagnosis not present

## 2013-03-13 ENCOUNTER — Ambulatory Visit (HOSPITAL_COMMUNITY)
Admission: RE | Admit: 2013-03-13 | Discharge: 2013-03-13 | Disposition: A | Discharge status: Home / Self Care | Payer: Medicare Other | Source: Ambulatory Visit | Attending: Family Medicine | Admitting: Family Medicine

## 2013-03-13 DIAGNOSIS — E049 Nontoxic goiter, unspecified: Secondary | ICD-10-CM

## 2013-03-13 DIAGNOSIS — E042 Nontoxic multinodular goiter: Secondary | ICD-10-CM | POA: Insufficient documentation

## 2013-04-16 DIAGNOSIS — H34239 Retinal artery branch occlusion, unspecified eye: Secondary | ICD-10-CM | POA: Diagnosis not present

## 2013-04-16 DIAGNOSIS — H4310 Vitreous hemorrhage, unspecified eye: Secondary | ICD-10-CM | POA: Diagnosis not present

## 2013-04-16 DIAGNOSIS — H348392 Tributary (branch) retinal vein occlusion, unspecified eye, stable: Secondary | ICD-10-CM | POA: Diagnosis not present

## 2013-04-23 ENCOUNTER — Encounter: Payer: Self-pay | Admitting: Adult Health

## 2013-04-23 ENCOUNTER — Ambulatory Visit (INDEPENDENT_AMBULATORY_CARE_PROVIDER_SITE_OTHER): Payer: Medicare Other | Admitting: Adult Health

## 2013-04-23 VITALS — BP 182/89 | HR 87 | Ht 64.0 in | Wt 166.0 lb

## 2013-04-23 DIAGNOSIS — I359 Nonrheumatic aortic valve disorder, unspecified: Secondary | ICD-10-CM | POA: Diagnosis not present

## 2013-04-23 DIAGNOSIS — I10 Essential (primary) hypertension: Secondary | ICD-10-CM

## 2013-04-23 MED ORDER — LISINOPRIL 5 MG PO TABS: 5.0000 mg | ORAL_TABLET | Freq: Every day | ORAL | Status: DC

## 2013-04-23 NOTE — Assessment & Plan Note (Signed)
Will repeat echo for re-evaluation and LV fx in the setting of AoV replacement and hypertension.

## 2013-04-23 NOTE — Patient Instructions (Addendum)
Your physician wants you to follow-up in:  1 month with Dr Wyline Mood      Your physician has recommended you make the following change in your medication:  1. Start Lisinopril 5 mg daily  Your physician recommends that you return for lab work in 1 week BMET  Your physician has requested that you have an echocardiogram. Echocardiography is a painless test that uses sound waves to create images of your heart. It provides your doctor with information about the size and shape of your heart and how well your heart's chambers and valves are working. This procedure takes approximately one hour. There are no restrictions for this procedure.  Please come back for BP check in 1 week.

## 2013-04-23 NOTE — Assessment & Plan Note (Signed)
BP has been elevated on the last two recordings with highest recorded today. I have rechecked her BP manually in the exam room and found it to correlate with triage reading. I will begin lisinopril 5 mg daily and continue her on amlodipine daily. She will follow up in one week for BP check and BMET. She will see Dr. Wyline Mood in one month,

## 2013-04-23 NOTE — Progress Notes (Deleted)
Name: Ana [name]    DOB: [birth year]  Age: 67 y.o.  MR#: [id number]       PCP:  Catalina Pizza, MD      Insurance: Payor: MEDICARE / Plan: MEDICARE PART A AND B / Product Type: *No Product type* /   CC:    Chief Complaint  Patient presents with  . Aortic Insuffiency    S/P AVR  . Hypertension    VS Filed Vitals:   04/23/13 1346  BP: 182/89  Pulse: 87  Height: 5\' 4"  (1.626 m)  Weight: 166 lb (75.297 kg)    Weights Current Weight  04/23/13 166 lb (75.297 kg)  05/29/12 155 lb 0.6 oz (70.326 kg)  11/27/10 146 lb (66.225 kg)    Blood Pressure  BP Readings from Last 3 Encounters:  04/23/13 182/89  05/29/12 144/89  05/11/12 153/70     Admit date:  (Not on file) Last encounter with RMR:  Visit date not found   Allergy Review of patient's allergies indicates no known allergies.  Current Outpatient Prescriptions  Medication Sig Dispense Refill  . amLODipine (NORVASC) 10 MG tablet Take 10 mg by mouth daily.        [name] warfarin (COUMADIN) 1 MG tablet Take 2 mg by mouth daily. Take with 6mg  for a total of 8mg  on tues,thurs.,sat.,sun.      . warfarin (COUMADIN) 10 MG tablet Take 10 mg by mouth 3 (three) times a week. Mon,wed,fri       No current facility-administered medications for this visit.    Discontinued Meds:    Medications Discontinued During This Encounter  Medication Reason  . amoxicillin (AMOXIL) 500 MG capsule Error    Patient Active Problem List   Diagnosis Date Noted  . S/P AVR 04/30/2012  . CHEST PAIN-PRECORDIAL 09/24/2009  . HYPERLIPIDEMIA-MIXED 11/16/2008  . DEPRESSION 11/16/2008  . HYPERTENSION, UNSPECIFIED 11/16/2008  . AORTIC INSUFFICIENCY 11/16/2008  . IBS 11/16/2008  . ARTHRITIS 11/16/2008  . SEIZURE DISORDER 11/16/2008  . CHRONIC FATIGUE SYNDROME 11/16/2008  . THYROID DISEASE, HX OF 11/16/2008    LABS    Component Value Date/Time   NA 140 11/27/2010 1037   K 4.1 11/27/2010 1037   CL 99 11/27/2010 1037   CO2 34* 11/27/2010 1037   GLUCOSE 120*  11/27/2010 1037   BUN 20 11/27/2010 1037   CREATININE 0.65 11/27/2010 1037   CALCIUM 8.9 11/27/2010 1037   GFRNONAA >60 11/27/2010 1037   GFRAA >60 11/27/2010 1037   CMP     Component Value Date/Time   NA 140 11/27/2010 1037   K 4.1 11/27/2010 1037   CL 99 11/27/2010 1037   CO2 34* 11/27/2010 1037   GLUCOSE 120* 11/27/2010 1037   BUN 20 11/27/2010 1037   CREATININE 0.65 11/27/2010 1037   CALCIUM 8.9 11/27/2010 1037   GFRNONAA >60 11/27/2010 1037   GFRAA >60 11/27/2010 1037    No results found for this basename: wbc, hgb, hct, mcv, platelets    Lipid Panel  No results found for this basename: chol, trig, hdl, cholhdl, vldl, ldlcalc    ABG No results found for this basename: phart, pco2, pco2art, po2, po2art, hco3, tco2, acidbasedef, o2sat     No results found for this basename: TSH   BNP (last 3 results) No results found for this basename: PROBNP,  in the last 8760 hours Cardiac Panel (last 3 results) No results found for this basename: CKTOTAL, CKMB, TROPONINI, RELINDX,  in the last 72 hours  Iron/TIBC/Ferritin No  results found for this basename: iron, tibc, ferritin     EKG Orders placed in visit on 04/23/13  . EKG 12-LEAD     Prior Assessment and Plan Problem List as of 04/23/2013     Cardiovascular and Mediastinum   HYPERTENSION, UNSPECIFIED   AORTIC INSUFFICIENCY   Last Assessment & Plan   05/29/2012 Office Visit Written 05/29/2012  4:02 PM by Gaylord Shih, MD     Status post remote metal prosthetic aortic valve replacement. She is stable by history and exam. Continue Coumadin with careful monitoring by primary care. We'll see her back on an annual basis. No indication for an echocardiogram.    S/P AVR     Digestive   IBS     Musculoskeletal and Integument   ARTHRITIS     Other   HYPERLIPIDEMIA-MIXED   DEPRESSION   SEIZURE DISORDER   CHRONIC FATIGUE SYNDROME   CHEST PAIN-PRECORDIAL   Last Assessment & Plan   05/29/2012 Office Visit Written 05/29/2012  4:03 PM  by Gaylord Shih, MD     Historically, this does not sound cardiac. EKG unremarkable. Reassurance given.    THYROID DISEASE, HX OF       Imaging: No results found.

## 2013-04-23 NOTE — Progress Notes (Signed)
    HPI: Ana [name] is a 67 year old former patient of Dr. wall were following for ongoing evaluation and management of aortic valve disease status post prosthetic aortic valve replacement. She has a history of heavy metal disease unspecified and receives relation therapy in [place].     On last visit in January of 2014 the patient was having some atypical chest pain or shoulder discomfort which occurred randomly. The patient was continued on Coumadin therapy. Is followed by her primary care physician for dosing. At that time she had no complaints and no medication changes or tests were ordered.   She comes today without complaint. She has been taking OTC natural supplements. She is noticing that her BP is rising and has been over the last few months. It was noted up with chelation appt.   No Known Allergies  Current Outpatient Prescriptions  Medication Sig Dispense Refill  . amLODipine (NORVASC) 10 MG tablet Take 10 mg by mouth daily.        [name] warfarin (COUMADIN) 1 MG tablet Take 2 mg by mouth daily. Take with 6mg  for a total of 8mg  on tues,thurs.,sat.,sun.      . warfarin (COUMADIN) 10 MG tablet Take 10 mg by mouth 3 (three) times a week. Mon,wed,fri       No current facility-administered medications for this visit.    Past Medical History  Diagnosis Date  . Renal disorder   . Hypertension   . Seizures   . Chronic fatigue syndrome   . IBS (irritable bowel syndrome)   . History of thyroid disease   . Hyperlipidemia   . Depression   . Arthritis   . Aortic insufficiency   . Blind left eye     Past Surgical History  Procedure Laterality Date  . Aortic valve replacement    . Tubal ligation    . Bladder surgery    . Eye surgery      WUJ:WJXBJY of systems complete and found to be negative unless listed above  PHYSICAL EXAM BP 182/89  Pulse 87  Ht 5\' 4"  (1.626 m)  Wt 166 lb (75.297 kg)  BMI 28.48 kg/m2  General: Well developed, well nourished, in no acute  distress Head: Eyes PERRLA, No xanthomas.   Normal cephalic and atramatic  Lungs: Clear bilaterally to auscultation and percussion. Heart: HRRR S1 S2, without MRG.  Pulses are 2+ & equal.            No carotid bruit. No JVD.  No abdominal bruits. No femoral bruits. Abdomen: Bowel sounds are positive, abdomen soft and non-tender without masses or                  Hernia's noted. Msk:  Back normal, normal gait. Normal strength and tone for age. Extremities: No clubbing, cyanosis or edema.  DP +1 Neuro: Alert and oriented X 3. Psych:  Good affect, responds appropriately  EKG:NSR with lst degree AV block. Non-specific T-wave abnormality laterally.   ASSESSMENT AND PLAN

## 2013-04-23 NOTE — Addendum Note (Signed)
Addended by: Thompson Grayer on: 04/23/2013 02:46 PM   Modules accepted: Orders

## 2013-04-24 DIAGNOSIS — Z7901 Long term (current) use of anticoagulants: Secondary | ICD-10-CM | POA: Diagnosis not present

## 2013-04-24 DIAGNOSIS — I10 Essential (primary) hypertension: Secondary | ICD-10-CM | POA: Diagnosis not present

## 2013-04-30 DIAGNOSIS — I10 Essential (primary) hypertension: Secondary | ICD-10-CM | POA: Diagnosis not present

## 2013-05-01 ENCOUNTER — Encounter: Payer: Self-pay | Admitting: *Deleted

## 2013-05-01 LAB — BASIC METABOLIC PANEL
CO2: 29 mEq/L (ref 19–32)
Glucose, Bld: 102 mg/dL — ABNORMAL HIGH (ref 70–99)
Potassium: 4.3 mEq/L (ref 3.5–5.3)
Sodium: 139 mEq/L (ref 135–145)

## 2013-05-05 ENCOUNTER — Ambulatory Visit (HOSPITAL_COMMUNITY)
Admission: RE | Admit: 2013-05-05 | Discharge: 2013-05-05 | Disposition: A | Discharge status: Home / Self Care | Payer: Medicare Other | Source: Ambulatory Visit | Attending: Adult Health | Admitting: Adult Health

## 2013-05-05 DIAGNOSIS — I517 Cardiomegaly: Secondary | ICD-10-CM | POA: Diagnosis not present

## 2013-05-05 DIAGNOSIS — I10 Essential (primary) hypertension: Secondary | ICD-10-CM

## 2013-06-06 ENCOUNTER — Ambulatory Visit (INDEPENDENT_AMBULATORY_CARE_PROVIDER_SITE_OTHER): Payer: Medicare Other | Admitting: Cardiology

## 2013-06-06 ENCOUNTER — Encounter: Payer: Self-pay | Admitting: Cardiology

## 2013-06-06 VITALS — BP 144/64 | HR 85 | Ht 63.0 in | Wt 163.0 lb

## 2013-06-06 DIAGNOSIS — I10 Essential (primary) hypertension: Secondary | ICD-10-CM

## 2013-06-06 DIAGNOSIS — E785 Hyperlipidemia, unspecified: Secondary | ICD-10-CM | POA: Diagnosis not present

## 2013-06-06 DIAGNOSIS — I351 Nonrheumatic aortic (valve) insufficiency: Secondary | ICD-10-CM

## 2013-06-06 DIAGNOSIS — R0789 Other chest pain: Secondary | ICD-10-CM

## 2013-06-06 DIAGNOSIS — I359 Nonrheumatic aortic valve disorder, unspecified: Secondary | ICD-10-CM

## 2013-06-06 NOTE — Patient Instructions (Signed)
Your physician wants you to follow-up in: ONE YEAR You will receive a reminder letter in the mail two months in advance. If you don't receive a letter, please call our office to schedule the follow-up appointment.  

## 2013-06-06 NOTE — Progress Notes (Signed)
Clinical Summary Ms. Fayette is a 68 y.o.female former patient of Dr Verl Blalock, this is our first visit together. He is seen for the following medical problems.  1. HTN - elevated last visit, started on lisinopril. Stable potassium and Cr since starting. - does not check bp at home - compliant with meds, typically takes before bedtime  2. Hyperlipidemia - does not want therapy - followed by PCP, no recent panel in our system.  3. Aortic regurgitation - prior aortic valve replacement, recent echo shows normal functioning valve - on coumadin, denies any bleeding issues.  - no SOB, no orthopnea, no PND, no swelling in legs  4. Chest pain - started few years ago. Pain shoots down arm either arm, can go into left chest or right.  - has had history of precordial pain for several years - [place] MPI 2011 with ischemia   Past Medical History  Diagnosis Date  . Renal disorder   . Hypertension   . Seizures   . Chronic fatigue syndrome   . IBS (irritable bowel syndrome)   . History of thyroid disease   . Hyperlipidemia   . Depression   . Arthritis   . Aortic insufficiency   . Blind left eye      No Known Allergies   Current Outpatient Prescriptions  Medication Sig Dispense Refill  . amLODipine (NORVASC) 10 MG tablet Take 10 mg by mouth daily.        [name] lisinopril (PRINIVIL,ZESTRIL) 5 MG tablet Take 1 tablet (5 mg total) by mouth daily.  90 tablet  1  . warfarin (COUMADIN) 1 MG tablet Take 2 mg by mouth daily. Take with 6mg  for a total of 8mg  on tues,thurs.,sat.,sun.      . warfarin (COUMADIN) 10 MG tablet Take 10 mg by mouth 3 (three) times a week. Mon,wed,fri       No current facility-administered medications for this visit.     Past Surgical History  Procedure Laterality Date  . Aortic valve replacement    . Tubal ligation    . Bladder surgery    . Eye surgery       No Known Allergies    Family History  Problem Relation Age of Onset  . Hypertension Mother        Social History Ms. Fahey reports that she has never smoked. She does not have any smokeless tobacco history on file. Ms. Gaster reports that she drinks alcohol.   Review of Systems CONSTITUTIONAL: No weight loss, fever, chills, weakness or fatigue.  HEENT: Eyes: No visual loss, blurred vision, double vision or yellow sclerae.No hearing loss, sneezing, congestion, runny nose or sore throat.  SKIN: No rash or itching.  CARDIOVASCULAR: per HPI RESPIRATORY: No shortness of breath, cough or sputum.  GASTROINTESTINAL: No anorexia, nausea, vomiting or diarrhea. No abdominal pain or blood.  GENITOURINARY: No burning on urination, no polyuria NEUROLOGICAL: No headache, dizziness, syncope, paralysis, ataxia, numbness or tingling in the extremities. No change in bowel or bladder control.  MUSCULOSKELETAL: arm pain LYMPHATICS: No enlarged nodes. No history of splenectomy.  PSYCHIATRIC: No history of depression or anxiety.  ENDOCRINOLOGIC: No reports of sweating, cold or heat intolerance. No polyuria or polydipsia.  [name]   Physical Examination p 85 bp 144/64 Wt 163 lbs BMI 29 Gen: resting comfortably, no acute distress HEENT: no scleral icterus, pupils equal round and reactive, no palptable cervical adenopathy,  CV: RRR, mechanical S2, no JVD, no carotid bruits Resp: Clear to auscultation  bilaterally GI: abdomen is soft, non-tender, non-distended, normal bowel sounds, no hepatosplenomegaly MSK: extremities are warm, no edema.  Skin: warm, no rash Neuro:  no focal deficits Psych: appropriate affect   Diagnostic Studies 05/05/13 Echo LVEF 55-60%, mild LVH, now WMAs, grade I diastolic dysfunction, normal AVR,     Assessment and Plan  1. HTN - bp at goal since starting lisinopril - continue current medications  2. Aortic regurgitation - with prior valve replacement, recent echo shows normal valve function - compliant with coumadin, no bleeding issues  3. Hyperlipidemia - per  notes, no recent panel in our system. She states she would not want treatment.  4. Chest pain - atypical, ongoing and unchanged for several years. Previously negative non-invasive testing.    Follow up 1 year   Arnoldo Lenis, M.D., F.A.C.C.

## 2013-06-16 DIAGNOSIS — I4891 Unspecified atrial fibrillation: Secondary | ICD-10-CM | POA: Diagnosis not present

## 2013-06-30 DIAGNOSIS — Z79899 Other long term (current) drug therapy: Secondary | ICD-10-CM | POA: Diagnosis not present

## 2013-06-30 DIAGNOSIS — Z7901 Long term (current) use of anticoagulants: Secondary | ICD-10-CM | POA: Diagnosis not present

## 2013-07-11 DIAGNOSIS — I10 Essential (primary) hypertension: Secondary | ICD-10-CM | POA: Diagnosis not present

## 2013-09-23 DIAGNOSIS — IMO0002 Reserved for concepts with insufficient information to code with codable children: Secondary | ICD-10-CM | POA: Diagnosis not present

## 2013-10-03 DIAGNOSIS — H35039 Hypertensive retinopathy, unspecified eye: Secondary | ICD-10-CM | POA: Diagnosis not present

## 2013-10-03 DIAGNOSIS — H2100 Hyphema, unspecified eye: Secondary | ICD-10-CM | POA: Diagnosis not present

## 2013-10-07 ENCOUNTER — Telehealth: Payer: Self-pay | Admitting: *Deleted

## 2013-10-07 DIAGNOSIS — I10 Essential (primary) hypertension: Secondary | ICD-10-CM

## 2013-10-07 MED ORDER — LISINOPRIL 5 MG PO TABS: 5.0000 mg | ORAL_TABLET | Freq: Every day | ORAL | Status: DC

## 2013-10-07 NOTE — Telephone Encounter (Signed)
LISINOPRIL 5 MG #90 

## 2013-10-07 NOTE — Telephone Encounter (Signed)
Medication sent via escribe.  

## 2013-10-22 DIAGNOSIS — H11439 Conjunctival hyperemia, unspecified eye: Secondary | ICD-10-CM | POA: Diagnosis not present

## 2013-10-22 DIAGNOSIS — H35039 Hypertensive retinopathy, unspecified eye: Secondary | ICD-10-CM | POA: Diagnosis not present

## 2013-10-22 DIAGNOSIS — H5789 Other specified disorders of eye and adnexa: Secondary | ICD-10-CM | POA: Diagnosis not present

## 2013-10-22 DIAGNOSIS — H5710 Ocular pain, unspecified eye: Secondary | ICD-10-CM | POA: Diagnosis not present

## 2013-10-23 DIAGNOSIS — G520 Disorders of olfactory nerve: Secondary | ICD-10-CM | POA: Diagnosis not present

## 2013-10-23 DIAGNOSIS — I4891 Unspecified atrial fibrillation: Secondary | ICD-10-CM | POA: Diagnosis not present

## 2013-10-23 DIAGNOSIS — Z7901 Long term (current) use of anticoagulants: Secondary | ICD-10-CM | POA: Diagnosis not present

## 2013-11-05 ENCOUNTER — Ambulatory Visit (INDEPENDENT_AMBULATORY_CARE_PROVIDER_SITE_OTHER): Payer: Medicare Other | Admitting: Cardiology

## 2013-11-05 ENCOUNTER — Encounter: Payer: Self-pay | Admitting: Cardiology

## 2013-11-05 VITALS — BP 126/82 | HR 81 | Ht 64.0 in | Wt 164.0 lb

## 2013-11-05 DIAGNOSIS — Z01810 Encounter for preprocedural cardiovascular examination: Secondary | ICD-10-CM

## 2013-11-05 NOTE — Patient Instructions (Signed)
Continue all current medications. Your physician wants you to follow up in: 6 months.  You will receive a reminder letter in the mail one-two months in advance.  If you don't receive a letter, please call our office to schedule the follow up appointment   

## 2013-11-05 NOTE — Progress Notes (Addendum)
Clinical Summary Ana [name] is a 68 y.o.female seen today for follow up of the following medical problems. This is a focused preoperative evaluation for upcoming eye surgery.    1. Preoperative evaluation - sedentary lifestyle, reprorts she can walk multiple blocks without issues.Can walk up 1-2 flights of stairs without troubles No chest pain, no SOB or DOE.    2. Chest soreness - reports on Saturday episode "cramp in my heart", lasted 3 hours. Started around 12pm after carrying heavy package. Worst with deep breath, worst position, worst turning. Constant soreness has improved.  Past Medical History  Diagnosis Date  . Renal disorder   . Hypertension   . Seizures   . Chronic fatigue syndrome   . IBS (irritable bowel syndrome)   . History of thyroid disease   . Hyperlipidemia   . Depression   . Arthritis   . Aortic insufficiency   . Blind left eye      No Known Allergies   Current Outpatient Prescriptions  Medication Sig Dispense Refill  . amLODipine (NORVASC) 10 MG tablet Take 10 mg by mouth daily.        [name] lisinopril (PRINIVIL,ZESTRIL) 5 MG tablet Take 1 tablet (5 mg total) by mouth daily.  90 tablet  1  . warfarin (COUMADIN) 1 MG tablet Take 2 mg by mouth daily. Take with 6mg  for a total of 8mg  on tues,thurs.,sat.,sun.      . warfarin (COUMADIN) 10 MG tablet Take 10 mg by mouth 3 (three) times a week. Mon,wed,fri       No current facility-administered medications for this visit.     Past Surgical History  Procedure Laterality Date  . Aortic valve replacement    . Tubal ligation    . Bladder surgery    . Eye surgery       No Known Allergies    Family History  Problem Relation Age of Onset  . Hypertension Mother      Social History Ms. Stampley reports that she has never smoked. She does not have any smokeless tobacco history on file. Ms. Meuser reports that she drinks alcohol.   Review of Systems CONSTITUTIONAL: No weight loss, fever, chills,  weakness or fatigue.  HEENT: Eyes: No visual loss, blurred vision, double vision or yellow sclerae.No hearing loss, sneezing, congestion, runny nose or sore throat.  SKIN: No rash or itching.  CARDIOVASCULAR: per HPI RESPIRATORY: No shortness of breath, cough or sputum.  GASTROINTESTINAL: No anorexia, nausea, vomiting or diarrhea. No abdominal pain or blood.  GENITOURINARY: No burning on urination, no polyuria NEUROLOGICAL: No headache, dizziness, syncope, paralysis, ataxia, numbness or tingling in the extremities. No change in bowel or bladder control.  MUSCULOSKELETAL: chest soreness LYMPHATICS: No enlarged nodes. No history of splenectomy.  PSYCHIATRIC: No history of depression or anxiety.  ENDOCRINOLOGIC: No reports of sweating, cold or heat intolerance. No polyuria or polydipsia.  [name]   Physical Examination p 81 bp 126/82 Wt 164 lbs BMI 28 Gen: resting comfortably, no acute distress HEENT: no scleral icterus, pupils equal round and reactive, no palptable cervical adenopathy,  CV: RRR, 2/6 systolic murmur RUSB, no JVD Resp: Clear to auscultation bilaterally GI: abdomen is soft, non-tender, non-distended, normal bowel sounds, no hepatosplenomegaly MSK: extremities are warm, no edema.  Skin: warm, no rash Neuro:  no focal deficits Psych: appropriate affect   Diagnostic Studies 05/05/13 Echo  LVEF 55-60%, mild LVH, now WMAs, grade I diastolic dysfunction, normal AVR,     Assessment  and Plan  1. Preoperative evaluation - she is being considered for a low to moderate risk eye surgery. She has no active acute cardiac conditions. By history she tolerates >4 METs without significant limitation. Recommend proceeding with surgery as planned without any further cardiac testing or interventions - she has a mechanical aortic valve. Based on 2012 CHEST guidelines she is lower interuption of anticoagulation, and bridging is not indicated. She will holder her coumadin starting 3 days prior to  her surgery, resume the day after her procedure.     2. Chest wall pain - MSK pain after lifting heavy box that is resolving, no further therapy indicated   Arnoldo Lenis, M.D., F.A.C.C.

## 2013-11-06 ENCOUNTER — Telehealth: Payer: Self-pay | Admitting: Cardiology

## 2013-11-06 NOTE — Telephone Encounter (Signed)
patientwill be calling in reference to starting injections

## 2013-11-06 NOTE — Telephone Encounter (Signed)
Returned pt's call.  Pt is pending eye enucleation 11/14/13. Per discussion with Dr Harl Bowie yesterday pt will hold coumadin 3 days prior to surgery and restart the night of surgery.  She will follow up with Dr Merlyn Albert 5-7 days after surgery for repeat INR.  See Dr Nelly Laurence note from 11/05/13. Pt verbalized understanding.

## 2013-11-10 ENCOUNTER — Telehealth: Payer: Self-pay | Admitting: *Deleted

## 2013-11-10 NOTE — Telephone Encounter (Signed)
Message copied by Laurine Blazer on Mon Nov 10, 2013  3:36 PM ------      Message from: [place] F      Created: Fri Nov 07, 2013 11:36 AM       Called patient today around 1120AM and asked to call us back about the handling of her coumadin. Update her we spoke with our coumadin clinic and the surgeons, she does not need to bridge with lovenox. Stop coumadin 3 days before surgery, restart the day after surgery. Please fax papers to surgeon            Zandra Abts MD ------

## 2013-11-10 NOTE — Telephone Encounter (Signed)
Patient notified & notes faxed to surgeon.

## 2013-11-12 ENCOUNTER — Telehealth: Payer: Self-pay | Admitting: *Deleted

## 2013-11-12 NOTE — Telephone Encounter (Signed)
FAX # [phone] 

## 2013-11-12 NOTE — Telephone Encounter (Signed)
[facility] calling for Dr. Claudell Kyle (anesthesiology) - questioning if patient needs prophylactic ABO for bacterial endocarditis due to her mechanical heart valve.

## 2013-11-13 NOTE — Telephone Encounter (Signed)
Ana [name] from [facility] calling again this morning, awaiting response on question below.  Surgery is scheduled for tomorrow.  Stated that she will have Dr. Zadie Rhine call Dr. Harl Bowie today.

## 2013-11-14 ENCOUNTER — Other Ambulatory Visit: Payer: Self-pay | Admitting: Ophthalmology

## 2013-11-14 DIAGNOSIS — H5710 Ocular pain, unspecified eye: Secondary | ICD-10-CM | POA: Diagnosis not present

## 2013-11-14 DIAGNOSIS — H16209 Unspecified keratoconjunctivitis, unspecified eye: Secondary | ICD-10-CM | POA: Diagnosis not present

## 2013-11-14 DIAGNOSIS — H5460 Unqualified visual loss, one eye, unspecified: Secondary | ICD-10-CM | POA: Diagnosis not present

## 2013-11-14 DIAGNOSIS — H579 Unspecified disorder of eye and adnexa: Secondary | ICD-10-CM | POA: Diagnosis not present

## 2013-11-14 DIAGNOSIS — H16219 Exposure keratoconjunctivitis, unspecified eye: Secondary | ICD-10-CM | POA: Diagnosis not present

## 2013-11-14 DIAGNOSIS — H35039 Hypertensive retinopathy, unspecified eye: Secondary | ICD-10-CM | POA: Diagnosis not present

## 2013-11-20 DIAGNOSIS — G520 Disorders of olfactory nerve: Secondary | ICD-10-CM | POA: Diagnosis not present

## 2013-11-20 DIAGNOSIS — Z7901 Long term (current) use of anticoagulants: Secondary | ICD-10-CM | POA: Diagnosis not present

## 2013-11-20 DIAGNOSIS — S0570XA Avulsion of unspecified eye, initial encounter: Secondary | ICD-10-CM | POA: Diagnosis not present

## 2013-12-15 DIAGNOSIS — H5710 Ocular pain, unspecified eye: Secondary | ICD-10-CM | POA: Diagnosis not present

## 2014-02-09 DIAGNOSIS — I10 Essential (primary) hypertension: Secondary | ICD-10-CM | POA: Diagnosis not present

## 2014-03-06 DIAGNOSIS — Z7901 Long term (current) use of anticoagulants: Secondary | ICD-10-CM | POA: Diagnosis not present

## 2014-03-06 DIAGNOSIS — Z8679 Personal history of other diseases of the circulatory system: Secondary | ICD-10-CM | POA: Diagnosis not present

## 2014-03-31 ENCOUNTER — Telehealth: Payer: Self-pay | Admitting: *Deleted

## 2014-03-31 DIAGNOSIS — I10 Essential (primary) hypertension: Secondary | ICD-10-CM

## 2014-03-31 MED ORDER — LISINOPRIL 5 MG PO TABS: 5.0000 mg | ORAL_TABLET | Freq: Every day | ORAL | Status: DC

## 2014-03-31 NOTE — Telephone Encounter (Signed)
LISINOPRIL 5 MG #90

## 2014-07-13 ENCOUNTER — Emergency Department (HOSPITAL_COMMUNITY): Payer: No Typology Code available for payment source

## 2014-07-13 ENCOUNTER — Encounter (HOSPITAL_COMMUNITY): Payer: Self-pay

## 2014-07-13 ENCOUNTER — Emergency Department (HOSPITAL_COMMUNITY)
Admission: EM | Admit: 2014-07-13 | Discharge: 2014-07-13 | Disposition: A | Discharge status: Home / Self Care | Payer: No Typology Code available for payment source | Attending: Emergency Medicine | Admitting: Emergency Medicine

## 2014-07-13 DIAGNOSIS — Y9389 Activity, other specified: Secondary | ICD-10-CM | POA: Diagnosis not present

## 2014-07-13 DIAGNOSIS — Z87448 Personal history of other diseases of urinary system: Secondary | ICD-10-CM | POA: Diagnosis not present

## 2014-07-13 DIAGNOSIS — Z8639 Personal history of other endocrine, nutritional and metabolic disease: Secondary | ICD-10-CM | POA: Diagnosis not present

## 2014-07-13 DIAGNOSIS — R9431 Abnormal electrocardiogram [ECG] [EKG]: Secondary | ICD-10-CM | POA: Diagnosis not present

## 2014-07-13 DIAGNOSIS — S299XXA Unspecified injury of thorax, initial encounter: Secondary | ICD-10-CM | POA: Diagnosis not present

## 2014-07-13 DIAGNOSIS — S82001A Unspecified fracture of right patella, initial encounter for closed fracture: Secondary | ICD-10-CM

## 2014-07-13 DIAGNOSIS — I10 Essential (primary) hypertension: Secondary | ICD-10-CM | POA: Diagnosis not present

## 2014-07-13 DIAGNOSIS — S82091A Other fracture of right patella, initial encounter for closed fracture: Secondary | ICD-10-CM | POA: Insufficient documentation

## 2014-07-13 DIAGNOSIS — Y92410 Unspecified street and highway as the place of occurrence of the external cause: Secondary | ICD-10-CM | POA: Insufficient documentation

## 2014-07-13 DIAGNOSIS — H5442 Blindness, left eye, normal vision right eye: Secondary | ICD-10-CM | POA: Insufficient documentation

## 2014-07-13 DIAGNOSIS — Z8719 Personal history of other diseases of the digestive system: Secondary | ICD-10-CM | POA: Insufficient documentation

## 2014-07-13 DIAGNOSIS — Z7901 Long term (current) use of anticoagulants: Secondary | ICD-10-CM | POA: Diagnosis not present

## 2014-07-13 DIAGNOSIS — Y998 Other external cause status: Secondary | ICD-10-CM | POA: Insufficient documentation

## 2014-07-13 DIAGNOSIS — M25561 Pain in right knee: Secondary | ICD-10-CM | POA: Diagnosis not present

## 2014-07-13 DIAGNOSIS — F419 Anxiety disorder, unspecified: Secondary | ICD-10-CM | POA: Insufficient documentation

## 2014-07-13 DIAGNOSIS — Z862 Personal history of diseases of the blood and blood-forming organs and certain disorders involving the immune mechanism: Secondary | ICD-10-CM | POA: Diagnosis not present

## 2014-07-13 DIAGNOSIS — S8991XA Unspecified injury of right lower leg, initial encounter: Secondary | ICD-10-CM | POA: Diagnosis present

## 2014-07-13 DIAGNOSIS — Z8739 Personal history of other diseases of the musculoskeletal system and connective tissue: Secondary | ICD-10-CM | POA: Diagnosis not present

## 2014-07-13 DIAGNOSIS — Z8659 Personal history of other mental and behavioral disorders: Secondary | ICD-10-CM | POA: Diagnosis not present

## 2014-07-13 DIAGNOSIS — T148 Other injury of unspecified body region: Secondary | ICD-10-CM | POA: Diagnosis not present

## 2014-07-13 DIAGNOSIS — Z79899 Other long term (current) drug therapy: Secondary | ICD-10-CM | POA: Insufficient documentation

## 2014-07-13 DIAGNOSIS — W19XXXA Unspecified fall, initial encounter: Secondary | ICD-10-CM

## 2014-07-13 MED ORDER — DEXAMETHASONE SODIUM PHOSPHATE 10 MG/ML IJ SOLN: 10.0000 mg | Freq: Once | INTRAMUSCULAR | Status: DC

## 2014-07-13 MED ORDER — OXYCODONE-ACETAMINOPHEN 5-325 MG PO TABS
1.0000 | ORAL_TABLET | Freq: Four times a day (QID) | ORAL | Status: DC | PRN
Start: 2014-07-13 — End: 2014-07-20

## 2014-07-13 MED ORDER — OXYCODONE-ACETAMINOPHEN 5-325 MG PO TABS
1.0000 | ORAL_TABLET | Freq: Once | ORAL | Status: AC
  Administered 2014-07-13: 1 via ORAL
  Filled 2014-07-13: qty 1

## 2014-07-13 MED ORDER — HYDROMORPHONE HCL 1 MG/ML IJ SOLN
1.0000 mg | Freq: Once | INTRAMUSCULAR | Status: AC
  Administered 2014-07-13: 1 mg via INTRAVENOUS
  Filled 2014-07-13: qty 1
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
## 2014-07-13 NOTE — ED Notes (Signed)
Mariann Laster, case manager at bedside.

## 2014-07-13 NOTE — ED Notes (Signed)
Family at bedside.  Spoke with family regarding discharge.  Additional questions asked regarding d/c.  Dr. Vanita Panda to come speak with family to further answer questions.

## 2014-07-13 NOTE — ED Notes (Signed)
Pt back from X-ray.  

## 2014-07-13 NOTE — Discharge Instructions (Signed)
As discussed, and is very important that you follow-up with our orthopedist tomorrow via telephone to arrange appropriate ongoing care.  Return here for concerning changes in your condition.

## 2014-07-13 NOTE — ED Notes (Signed)
EDP requesting walker for pt.  Case management notified for need for walker for pt.

## 2014-07-13 NOTE — ED Notes (Signed)
Pt transported to Xray. 

## 2014-07-15 ENCOUNTER — Encounter (HOSPITAL_COMMUNITY): Payer: Self-pay | Admitting: *Deleted

## 2014-07-15 ENCOUNTER — Other Ambulatory Visit (HOSPITAL_COMMUNITY): Payer: Self-pay | Admitting: Orthopaedic Surgery

## 2014-07-15 DIAGNOSIS — S82041A Displaced comminuted fracture of right patella, initial encounter for closed fracture: Secondary | ICD-10-CM | POA: Diagnosis not present

## 2014-07-15 MED ORDER — CEFAZOLIN SODIUM-DEXTROSE 2-3 GM-% IV SOLR
2.0000 g | INTRAVENOUS | Status: AC
  Administered 2014-07-16: 2 g via INTRAVENOUS
  Filled 2014-07-15: qty 50

## 2014-07-15 NOTE — Progress Notes (Signed)
Pt cardiologist is Dr. Carlyle Dolly. Pt stated that her last dose of Coumadin was Monday, 07/13/14. Pt made aware to stop NSAID's, otc vitamins and herbal medications. Please measure pt neck DOS t0 complete Apnea screening. Pt stated that she has an eye implant that is taken out and cleaned every 6 months at the doctor's office.

## 2014-07-15 NOTE — Anesthesia Preprocedure Evaluation (Signed)
Anesthesia Evaluation  Patient identified by MRN, date of birth, ID band Patient awake    Reviewed: Allergy & Precautions, NPO status , Patient's Chart, lab work & pertinent test results  Airway        Dental   Pulmonary former smoker,          Cardiovascular hypertension, Pt. on medications  ECHO 2014 EF 60%, SP AVR   Neuro/Psych Seizures -,  Depression    GI/Hepatic negative GI ROS, Neg liver ROS,   Endo/Other    Renal/GU      Musculoskeletal   Abdominal   Peds  Hematology   Anesthesia Other Findings   Reproductive/Obstetrics                             Anesthesia Physical Anesthesia Plan  ASA: III  Anesthesia Plan: General   Post-op Pain Management: MAC Combined w/ Regional for Post-op pain   Induction: Intravenous  Airway Management Planned: Oral ETT and LMA  Additional Equipment:   Intra-op Plan:   Post-operative Plan:   Informed Consent: I have reviewed the patients History and Physical, chart, labs and discussed the procedure including the risks, benefits and alternatives for the proposed anesthesia with the patient or authorized representative who has indicated his/her understanding and acceptance.     Plan Discussed with:   Anesthesia Plan Comments:         Anesthesia Quick Evaluation

## 2014-07-16 ENCOUNTER — Ambulatory Visit (HOSPITAL_COMMUNITY): Payer: Medicare Other | Admitting: Anesthesiology

## 2014-07-16 ENCOUNTER — Encounter (HOSPITAL_COMMUNITY): Payer: Self-pay | Admitting: *Deleted

## 2014-07-16 ENCOUNTER — Encounter (HOSPITAL_COMMUNITY)
Admission: RE | Disposition: A | Discharge status: Home Health | Payer: Self-pay | Source: Ambulatory Visit | Attending: Orthopaedic Surgery

## 2014-07-16 ENCOUNTER — Inpatient Hospital Stay (HOSPITAL_COMMUNITY)
Admission: RE | Admit: 2014-07-16 | Discharge: 2014-07-20 | DRG: 489 | Disposition: A | Discharge status: Home Health | Payer: Medicare Other | Source: Ambulatory Visit | Attending: Orthopaedic Surgery | Admitting: Orthopaedic Surgery

## 2014-07-16 ENCOUNTER — Ambulatory Visit (HOSPITAL_COMMUNITY): Payer: Medicare Other

## 2014-07-16 DIAGNOSIS — I10 Essential (primary) hypertension: Secondary | ICD-10-CM | POA: Diagnosis not present

## 2014-07-16 DIAGNOSIS — E785 Hyperlipidemia, unspecified: Secondary | ICD-10-CM | POA: Diagnosis present

## 2014-07-16 DIAGNOSIS — Z7901 Long term (current) use of anticoagulants: Secondary | ICD-10-CM

## 2014-07-16 DIAGNOSIS — G8918 Other acute postprocedural pain: Secondary | ICD-10-CM | POA: Diagnosis not present

## 2014-07-16 DIAGNOSIS — Z531 Procedure and treatment not carried out because of patient's decision for reasons of belief and group pressure: Secondary | ICD-10-CM | POA: Diagnosis present

## 2014-07-16 DIAGNOSIS — Z79899 Other long term (current) drug therapy: Secondary | ICD-10-CM

## 2014-07-16 DIAGNOSIS — F329 Major depressive disorder, single episode, unspecified: Secondary | ICD-10-CM | POA: Diagnosis present

## 2014-07-16 DIAGNOSIS — S82001D Unspecified fracture of right patella, subsequent encounter for closed fracture with routine healing: Secondary | ICD-10-CM | POA: Diagnosis not present

## 2014-07-16 DIAGNOSIS — R5382 Chronic fatigue, unspecified: Secondary | ICD-10-CM | POA: Diagnosis not present

## 2014-07-16 DIAGNOSIS — Z8249 Family history of ischemic heart disease and other diseases of the circulatory system: Secondary | ICD-10-CM

## 2014-07-16 DIAGNOSIS — H5442 Blindness, left eye, normal vision right eye: Secondary | ICD-10-CM | POA: Diagnosis present

## 2014-07-16 DIAGNOSIS — Z419 Encounter for procedure for purposes other than remedying health state, unspecified: Secondary | ICD-10-CM

## 2014-07-16 DIAGNOSIS — S82001B Unspecified fracture of right patella, initial encounter for open fracture type I or II: Secondary | ICD-10-CM | POA: Diagnosis not present

## 2014-07-16 DIAGNOSIS — Z87891 Personal history of nicotine dependence: Secondary | ICD-10-CM

## 2014-07-16 DIAGNOSIS — K589 Irritable bowel syndrome without diarrhea: Secondary | ICD-10-CM | POA: Diagnosis present

## 2014-07-16 DIAGNOSIS — S82009A Unspecified fracture of unspecified patella, initial encounter for closed fracture: Secondary | ICD-10-CM | POA: Diagnosis present

## 2014-07-16 DIAGNOSIS — S82041A Displaced comminuted fracture of right patella, initial encounter for closed fracture: Principal | ICD-10-CM

## 2014-07-16 DIAGNOSIS — Z952 Presence of prosthetic heart valve: Secondary | ICD-10-CM

## 2014-07-16 HISTORY — PX: ORIF PATELLA: SHX5033

## 2014-07-16 HISTORY — DX: Procedure and treatment not carried out because of patient's decision for reasons of belief and group pressure: Z53.1

## 2014-07-16 HISTORY — DX: Reserved for inherently not codable concepts without codable children: IMO0001

## 2014-07-16 LAB — CBC
HCT: 40.9 % (ref 36.0–46.0)
HEMOGLOBIN: 13.5 g/dL (ref 12.0–15.0)
MCH: 29.9 pg (ref 26.0–34.0)
MCHC: 33 g/dL (ref 30.0–36.0)
MCV: 90.7 fL (ref 78.0–100.0)
PLATELETS: 472 10*3/uL — AB (ref 150–400)
RBC: 4.51 MIL/uL (ref 3.87–5.11)
RDW: 13.9 % (ref 11.5–15.5)
WBC: 14.6 10*3/uL — AB (ref 4.0–10.5)

## 2014-07-16 LAB — PROTIME-INR
INR: 1.72 — ABNORMAL HIGH (ref 0.00–1.49)
PROTHROMBIN TIME: 20.3 s — AB (ref 11.6–15.2)

## 2014-07-16 LAB — BASIC METABOLIC PANEL
Anion gap: 13 (ref 5–15)
BUN: 20 mg/dL (ref 6–23)
CO2: 24 mmol/L (ref 19–32)
Calcium: 8.9 mg/dL (ref 8.4–10.5)
Chloride: 100 mmol/L (ref 96–112)
Creatinine, Ser: 0.78 mg/dL (ref 0.50–1.10)
GFR calc Af Amer: >90 mL/min (ref >90)
GFR, EST NON AFRICAN AMERICAN: 84 mL/min — AB (ref >90)
Glucose, Bld: 144 mg/dL — ABNORMAL HIGH (ref 70–99)
Potassium: 4.1 mmol/L (ref 3.5–5.1)
Sodium: 137 mmol/L (ref 135–145)

## 2014-07-16 LAB — APTT: aPTT: 42 seconds — ABNORMAL HIGH (ref 24–37)

## 2014-07-16 SURGERY — OPEN REDUCTION INTERNAL FIXATION (ORIF) PATELLA
Anesthesia: General | Laterality: Right

## 2014-07-16 MED ORDER — FENTANYL CITRATE 0.05 MG/ML IJ SOLN
INTRAMUSCULAR | Status: DC | PRN
  Administered 2014-07-16 (×3): 50 ug via INTRAVENOUS

## 2014-07-16 MED ORDER — ARTIFICIAL TEARS OP OINT
TOPICAL_OINTMENT | OPHTHALMIC | Status: DC | PRN
  Administered 2014-07-16: 1 via OPHTHALMIC

## 2014-07-16 MED ORDER — DIPHENHYDRAMINE HCL 12.5 MG/5ML PO ELIX: 12.5000 mg | ORAL_SOLUTION | ORAL | Status: DC | PRN

## 2014-07-16 MED ORDER — DEXAMETHASONE SODIUM PHOSPHATE 4 MG/ML IJ SOLN
INTRAMUSCULAR | Status: AC
  Filled 2014-07-16: qty 2

## 2014-07-16 MED ORDER — ONDANSETRON HCL 4 MG/2ML IJ SOLN
INTRAMUSCULAR | Status: AC
  Filled 2014-07-16: qty 4

## 2014-07-16 MED ORDER — PROPOFOL 10 MG/ML IV BOLUS
INTRAVENOUS | Status: AC
  Filled 2014-07-16: qty 20

## 2014-07-16 MED ORDER — PHENYLEPHRINE HCL 10 MG/ML IJ SOLN
10.0000 mg | INTRAVENOUS | Status: DC | PRN
  Administered 2014-07-16: 20 ug/min via INTRAVENOUS

## 2014-07-16 MED ORDER — ONDANSETRON HCL 4 MG/2ML IJ SOLN: 4.0000 mg | Freq: Four times a day (QID) | INTRAMUSCULAR | Status: DC | PRN

## 2014-07-16 MED ORDER — METHOCARBAMOL 1000 MG/10ML IJ SOLN
500.0000 mg | Freq: Four times a day (QID) | INTRAVENOUS | Status: DC | PRN
  Filled 2014-07-16: qty 5

## 2014-07-16 MED ORDER — FENTANYL CITRATE 0.05 MG/ML IJ SOLN: 25.0000 ug | INTRAMUSCULAR | Status: DC | PRN

## 2014-07-16 MED ORDER — WARFARIN - PHARMACIST DOSING INPATIENT
Freq: Every day | Status: DC
  Administered 2014-07-16: 18:00:00

## 2014-07-16 MED ORDER — OXYCODONE HCL 5 MG PO TABS
5.0000 mg | ORAL_TABLET | ORAL | Status: DC | PRN
  Administered 2014-07-16: 10 mg via ORAL
  Administered 2014-07-16: 5 mg via ORAL
  Administered 2014-07-17 (×2): 10 mg via ORAL
  Filled 2014-07-16: qty 2
  Filled 2014-07-16: qty 1
  Filled 2014-07-16 (×2): qty 2

## 2014-07-16 MED ORDER — HYDROCODONE-ACETAMINOPHEN 5-325 MG PO TABS
1.0000 | ORAL_TABLET | ORAL | Status: DC | PRN
  Administered 2014-07-17 – 2014-07-20 (×16): 2 via ORAL
  Administered 2014-07-20: 1 via ORAL
  Filled 2014-07-16 (×18): qty 2

## 2014-07-16 MED ORDER — PROMETHAZINE HCL 25 MG/ML IJ SOLN: 6.2500 mg | INTRAMUSCULAR | Status: DC | PRN

## 2014-07-16 MED ORDER — FENTANYL CITRATE 0.05 MG/ML IJ SOLN
50.0000 ug | INTRAMUSCULAR | Status: DC | PRN
  Administered 2014-07-16: 50 ug via INTRAVENOUS

## 2014-07-16 MED ORDER — AMLODIPINE BESYLATE 10 MG PO TABS
10.0000 mg | ORAL_TABLET | Freq: Every day | ORAL | Status: DC
  Administered 2014-07-16 – 2014-07-20 (×5): 10 mg via ORAL
  Filled 2014-07-16 (×5): qty 1

## 2014-07-16 MED ORDER — NEOSTIGMINE METHYLSULFATE 10 MG/10ML IV SOLN
INTRAVENOUS | Status: DC | PRN
  Administered 2014-07-16: 3 mg via INTRAVENOUS

## 2014-07-16 MED ORDER — METHOCARBAMOL 500 MG PO TABS
500.0000 mg | ORAL_TABLET | Freq: Four times a day (QID) | ORAL | Status: DC | PRN
  Filled 2014-07-16 (×4): qty 1

## 2014-07-16 MED ORDER — HYDROMORPHONE HCL 1 MG/ML IJ SOLN
1.0000 mg | INTRAMUSCULAR | Status: DC | PRN
  Administered 2014-07-16 – 2014-07-18 (×3): 1 mg via INTRAVENOUS
  Filled 2014-07-16 (×3): qty 1

## 2014-07-16 MED ORDER — CEFAZOLIN SODIUM 1-5 GM-% IV SOLN
1.0000 g | Freq: Four times a day (QID) | INTRAVENOUS | Status: AC
  Administered 2014-07-16 – 2014-07-17 (×3): 1 g via INTRAVENOUS
  Filled 2014-07-16 (×3): qty 50

## 2014-07-16 MED ORDER — MORPHINE SULFATE 2 MG/ML IJ SOLN: 1.0000 mg | INTRAMUSCULAR | Status: DC | PRN

## 2014-07-16 MED ORDER — SODIUM CHLORIDE 0.9 % IV SOLN
INTRAVENOUS | Status: DC
  Administered 2014-07-16 – 2014-07-17 (×2): via INTRAVENOUS

## 2014-07-16 MED ORDER — MIDAZOLAM HCL 2 MG/2ML IJ SOLN
INTRAMUSCULAR | Status: AC
  Filled 2014-07-16: qty 2

## 2014-07-16 MED ORDER — LACTATED RINGERS IV SOLN
INTRAVENOUS | Status: DC
  Administered 2014-07-16: 09:00:00 via INTRAVENOUS

## 2014-07-16 MED ORDER — WARFARIN SODIUM 5 MG PO TABS
10.0000 mg | ORAL_TABLET | Freq: Once | ORAL | Status: AC
  Administered 2014-07-16: 10 mg via ORAL
  Filled 2014-07-16: qty 2

## 2014-07-16 MED ORDER — ROCURONIUM BROMIDE 100 MG/10ML IV SOLN
INTRAVENOUS | Status: DC | PRN
  Administered 2014-07-16: 30 mg via INTRAVENOUS

## 2014-07-16 MED ORDER — LIDOCAINE HCL (CARDIAC) 20 MG/ML IV SOLN
INTRAVENOUS | Status: AC
  Filled 2014-07-16: qty 5

## 2014-07-16 MED ORDER — ONDANSETRON HCL 4 MG/2ML IJ SOLN
INTRAMUSCULAR | Status: DC | PRN
  Administered 2014-07-16: 4 mg via INTRAVENOUS

## 2014-07-16 MED ORDER — METOCLOPRAMIDE HCL 5 MG PO TABS
5.0000 mg | ORAL_TABLET | Freq: Three times a day (TID) | ORAL | Status: DC | PRN
  Filled 2014-07-16: qty 2

## 2014-07-16 MED ORDER — ONDANSETRON HCL 4 MG PO TABS: 4.0000 mg | ORAL_TABLET | Freq: Four times a day (QID) | ORAL | Status: DC | PRN

## 2014-07-16 MED ORDER — LISINOPRIL 5 MG PO TABS
5.0000 mg | ORAL_TABLET | Freq: Every day | ORAL | Status: DC
  Administered 2014-07-16 – 2014-07-20 (×5): 5 mg via ORAL
  Filled 2014-07-16 (×5): qty 1

## 2014-07-16 MED ORDER — MIDAZOLAM HCL 2 MG/2ML IJ SOLN
1.0000 mg | INTRAMUSCULAR | Status: DC | PRN
  Administered 2014-07-16: 1 mg via INTRAVENOUS

## 2014-07-16 MED ORDER — PHENYLEPHRINE HCL 10 MG/ML IJ SOLN
INTRAMUSCULAR | Status: DC | PRN
  Administered 2014-07-16 (×4): 80 ug via INTRAVENOUS

## 2014-07-16 MED ORDER — LIDOCAINE HCL (CARDIAC) 20 MG/ML IV SOLN
INTRAVENOUS | Status: DC | PRN
  Administered 2014-07-16: 100 mg via INTRAVENOUS

## 2014-07-16 MED ORDER — FENTANYL CITRATE 0.05 MG/ML IJ SOLN
INTRAMUSCULAR | Status: AC
  Filled 2014-07-16: qty 2

## 2014-07-16 MED ORDER — FENTANYL CITRATE 0.05 MG/ML IJ SOLN
INTRAMUSCULAR | Status: AC
  Filled 2014-07-16: qty 5

## 2014-07-16 MED ORDER — LACTATED RINGERS IV SOLN
INTRAVENOUS | Status: DC | PRN
  Administered 2014-07-16 (×2): via INTRAVENOUS

## 2014-07-16 MED ORDER — 0.9 % SODIUM CHLORIDE (POUR BTL) OPTIME
TOPICAL | Status: DC | PRN
  Administered 2014-07-16: 1000 mL

## 2014-07-16 MED ORDER — PROPOFOL 10 MG/ML IV BOLUS
INTRAVENOUS | Status: DC | PRN
  Administered 2014-07-16: 180 mg via INTRAVENOUS

## 2014-07-16 MED ORDER — ROCURONIUM BROMIDE 50 MG/5ML IV SOLN
INTRAVENOUS | Status: AC
  Filled 2014-07-16: qty 1

## 2014-07-16 MED ORDER — DEXAMETHASONE SODIUM PHOSPHATE 4 MG/ML IJ SOLN
INTRAMUSCULAR | Status: DC | PRN
  Administered 2014-07-16: 8 mg via INTRAVENOUS

## 2014-07-16 MED ORDER — BUPIVACAINE-EPINEPHRINE (PF) 0.5% -1:200000 IJ SOLN
INTRAMUSCULAR | Status: DC | PRN
  Administered 2014-07-16: 30 mL via PERINEURAL

## 2014-07-16 MED ORDER — GLYCOPYRROLATE 0.2 MG/ML IJ SOLN
INTRAMUSCULAR | Status: DC | PRN
  Administered 2014-07-16: 0.4 mg via INTRAVENOUS

## 2014-07-16 MED ORDER — MEPERIDINE HCL 25 MG/ML IJ SOLN: 6.2500 mg | INTRAMUSCULAR | Status: DC | PRN

## 2014-07-16 MED ORDER — GLYCOPYRROLATE 0.2 MG/ML IJ SOLN
INTRAMUSCULAR | Status: AC
  Filled 2014-07-16: qty 3

## 2014-07-16 MED ORDER — METOCLOPRAMIDE HCL 5 MG/ML IJ SOLN: 5.0000 mg | Freq: Three times a day (TID) | INTRAMUSCULAR | Status: DC | PRN

## 2014-07-16 SURGICAL SUPPLY — 59 items
BANDAGE ELASTIC 4 VELCRO ST LF (GAUZE/BANDAGES/DRESSINGS) ×2 IMPLANT
BANDAGE ELASTIC 6 VELCRO ST LF (GAUZE/BANDAGES/DRESSINGS) ×2 IMPLANT
BANDAGE ESMARK 6X9 LF (GAUZE/BANDAGES/DRESSINGS) ×1 IMPLANT
BIT DRILL CANN 2.7X625 NONSTRL (BIT) ×2 IMPLANT
BNDG COHESIVE 4X5 TAN STRL (GAUZE/BANDAGES/DRESSINGS) ×2 IMPLANT
BNDG ESMARK 6X9 LF (GAUZE/BANDAGES/DRESSINGS) ×2
CANISTER SUCTION 1500CC (MISCELLANEOUS) ×2 IMPLANT
COVER SURGICAL LIGHT HANDLE (MISCELLANEOUS) ×2 IMPLANT
CUFF TOURNIQUET SINGLE 34IN LL (TOURNIQUET CUFF) ×2 IMPLANT
DRAPE C-ARM 42X72 X-RAY (DRAPES) ×2 IMPLANT
DRAPE U-SHAPE 47X51 STRL (DRAPES) ×2 IMPLANT
DRSG PAD ABDOMINAL 8X10 ST (GAUZE/BANDAGES/DRESSINGS) ×2 IMPLANT
DURAPREP 26ML APPLICATOR (WOUND CARE) ×2 IMPLANT
ELECT REM PT RETURN 9FT ADLT (ELECTROSURGICAL) ×2
ELECTRODE REM PT RTRN 9FT ADLT (ELECTROSURGICAL) ×1 IMPLANT
GAUZE SPONGE 4X4 12PLY STRL (GAUZE/BANDAGES/DRESSINGS) ×2 IMPLANT
GAUZE XEROFORM 1X8 LF (GAUZE/BANDAGES/DRESSINGS) ×2 IMPLANT
GLOVE BIO SURGEON STRL SZ8 (GLOVE) ×2 IMPLANT
GLOVE BIOGEL PI IND STRL 8 (GLOVE) ×1 IMPLANT
GLOVE BIOGEL PI INDICATOR 8 (GLOVE) ×1
GLOVE ORTHO TXT STRL SZ7.5 (GLOVE) ×2 IMPLANT
GOWN EXTRA PROTECTION XXL 0583 (GOWNS) ×2 IMPLANT
GOWN STRL REUS W/ TWL LRG LVL3 (GOWN DISPOSABLE) ×2 IMPLANT
GOWN STRL REUS W/ TWL XL LVL3 (GOWN DISPOSABLE) ×2 IMPLANT
GOWN STRL REUS W/TWL LRG LVL3 (GOWN DISPOSABLE) ×2
GOWN STRL REUS W/TWL XL LVL3 (GOWN DISPOSABLE) ×2
GUIDEWIRE THREADED 150MM (WIRE) ×4 IMPLANT
KIT BASIN OR (CUSTOM PROCEDURE TRAY) ×2 IMPLANT
KIT ROOM TURNOVER OR (KITS) ×2 IMPLANT
NS IRRIG 1000ML POUR BTL (IV SOLUTION) ×2 IMPLANT
PACK ORTHO EXTREMITY (CUSTOM PROCEDURE TRAY) ×2 IMPLANT
PAD ARMBOARD 7.5X6 YLW CONV (MISCELLANEOUS) ×4 IMPLANT
PAD CAST 4YDX4 CTTN HI CHSV (CAST SUPPLIES) ×1 IMPLANT
PADDING CAST COTTON 4X4 STRL (CAST SUPPLIES) ×1
PADDING CAST COTTON 6X4 STRL (CAST SUPPLIES) ×2 IMPLANT
PASSER SUT SWANSON 36MM LOOP (INSTRUMENTS) ×2 IMPLANT
PENCIL BUTTON HOLSTER BLD 10FT (ELECTRODE) ×2 IMPLANT
RETRIEVER SUT HEWSON (MISCELLANEOUS) ×2 IMPLANT
SCREW CANN S THRD/38 4.0 (Screw) ×2 IMPLANT
SCREW SHORT THREAD 4.0X34 (Screw) ×2 IMPLANT
SCREW SHORT THREAD 4.0X42 (Screw) ×2 IMPLANT
SPONGE GAUZE 4X4 12PLY STER LF (GAUZE/BANDAGES/DRESSINGS) ×2 IMPLANT
SPONGE LAP 18X18 X RAY DECT (DISPOSABLE) ×2 IMPLANT
STOCKINETTE IMPERVIOUS 9X36 MD (GAUZE/BANDAGES/DRESSINGS) ×4 IMPLANT
STOCKINETTE IMPERVIOUS LG (DRAPES) ×2 IMPLANT
SUCTION FRAZIER TIP 10 FR DISP (SUCTIONS) ×2 IMPLANT
SUT ETHILON 3 0 PS 1 (SUTURE) ×4 IMPLANT
SUT FIBERWIRE #2 38 T-5 BLUE (SUTURE) ×2
SUT VIC AB 0 CT1 27 (SUTURE) ×2
SUT VIC AB 0 CT1 27XBRD ANBCTR (SUTURE) ×2 IMPLANT
SUT VIC AB 1 CT1 27 (SUTURE) ×1
SUT VIC AB 1 CT1 27XBRD ANTBC (SUTURE) ×1 IMPLANT
SUT VIC AB 2-0 CT1 27 (SUTURE) ×2
SUT VIC AB 2-0 CT1 TAPERPNT 27 (SUTURE) ×2 IMPLANT
SUTURE FIBERWR #2 38 T-5 BLUE (SUTURE) ×1 IMPLANT
TOWEL OR 17X26 10 PK STRL BLUE (TOWEL DISPOSABLE) ×2 IMPLANT
TUBE CONNECTING 12X1/4 (SUCTIONS) ×2 IMPLANT
UNDERPAD 30X30 INCONTINENT (UNDERPADS AND DIAPERS) ×2 IMPLANT
YANKAUER SUCT BULB TIP NO VENT (SUCTIONS) ×2 IMPLANT

## 2014-07-16 NOTE — Transfer of Care (Signed)
Immediate Anesthesia Transfer of Care Note  Patient: Ana [name]  Procedure(s) Performed: Procedure(s): OPEN REDUCTION INTERNAL (ORIF) FIXATION RIGHT PATELLA FRACTURE (Right)  Patient Location: PACU  Anesthesia Type:General  Level of Consciousness: awake, alert  and oriented  Airway & Oxygen Therapy: Patient Spontanous Breathing and Patient connected to nasal cannula oxygen  Post-op Assessment: Report given to RN and Post -op Vital signs reviewed and stable  Post vital signs: Reviewed and stable  Last Vitals:  Filed Vitals:   07/16/14 1217  BP: 162/63  Pulse: 94  Temp:   Resp: 22    Complications: No apparent anesthesia complications
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
## 2014-07-16 NOTE — Progress Notes (Signed)
Orthopedic Tech Progress Note Patient Details:  Ana [name] 04-Jul-1945 [id number] [employer] called for brace Patient ID: Ana [name], female   DOB: 05/26/1945, 69 y.o.   MRN: [id number]   Ana [name] 07/16/2014, 12:35 PM
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
## 2014-07-16 NOTE — Anesthesia Postprocedure Evaluation (Signed)
  Anesthesia Post-op Note  Patient: Ana [name]  Procedure(s) Performed: Procedure(s): OPEN REDUCTION INTERNAL (ORIF) FIXATION RIGHT PATELLA FRACTURE (Right)  Patient Location: PACU  Anesthesia Type:General  Level of Consciousness: awake and alert   Airway and Oxygen Therapy: Patient Spontanous Breathing and Patient connected to nasal cannula oxygen  Post-op Pain: mild  Post-op Assessment: Post-op Vital signs reviewed and Patient's Cardiovascular Status Stable  Post-op Vital Signs: Reviewed and stable  Last Vitals:  Filed Vitals:   07/16/14 1021  BP:   Pulse: 91  Temp:   Resp: 21    Complications: No apparent anesthesia complications

## 2014-07-16 NOTE — Anesthesia Procedure Notes (Addendum)
Anesthesia Regional Block:  Femoral nerve block  Pre-Anesthetic Checklist: ,, timeout performed, Correct Patient, Correct Site, Correct Laterality, Correct Procedure, Correct Position, site marked, Risks and benefits discussed,  Surgical consent,  Pre-op evaluation,  At surgeon's request and post-op pain management  Laterality: Lower and Right  Prep: Maximum Sterile Barrier Precautions used and chloraprep       Needles:  Injection technique: Single-shot  Needle Type: Echogenic Stimulator Needle     Needle Length: 9cm 9 cm Needle Gauge: 21 and 21 G    Additional Needles:  Procedures: ultrasound guided (picture in chart) and nerve stimulator Femoral nerve block  Nerve Stimulator or Paresthesia:  Response: 0.5 mA,   Additional Responses:   Narrative:  Start time: 07/16/2014 9:55 AM End time: 07/16/2014 10:10 AM Injection made incrementally with aspirations every 5 mL.  Performed by: Personally  Anesthesiologist: Alexis Frock  Additional Notes: R femoral nerve block, [name] and Stim, marcaine .5% 30 ml with epi, multiple asp, talked to patient throughout, no complications   Procedure Name: Intubation Date/Time: 07/16/2014 10:47 AM Performed by: Susa Loffler Pre-anesthesia Checklist: Patient identified, Timeout performed, Emergency Drugs available, Suction available and Patient being monitored Patient Re-evaluated:Patient Re-evaluated prior to inductionOxygen Delivery Method: Circle system utilized Preoxygenation: Pre-oxygenation with 100% oxygen Intubation Type: IV induction Ventilation: Mask ventilation without difficulty Laryngoscope Size: Mac and 3 Grade View: Grade II Tube size: 7.5 mm Airway Equipment and Method: Stylet Placement Confirmation: ETT inserted through vocal cords under direct vision,  positive ETCO2 and breath sounds checked- equal and bilateral Secured at: 21 cm Tube secured with: Tape Dental Injury: Teeth and Oropharynx as per pre-operative  assessment

## 2014-07-16 NOTE — Progress Notes (Addendum)
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 Takes it out every 6 months to clean by the Dr.

## 2014-07-16 NOTE — Brief Op Note (Signed)
07/16/2014  11:50 AM  PATIENT:  Ana [name]  69 y.o. female  PRE-OPERATIVE DIAGNOSIS:  Right patella fracture  POST-OPERATIVE DIAGNOSIS: same  PROCEDURE:  Procedure(s): OPEN REDUCTION INTERNAL (ORIF) FIXATION RIGHT PATELLA FRACTURE (Right)  SURGEON:  Surgeon(s) and Role:    * Mcarthur Rossetti, MD - Primary  PHYSICIAN ASSISTANT: Benita Stabile, PA-C  ANESTHESIA:   regional and general  LOCAL MEDICATIONS USED:  NONE  SPECIMEN:  No Specimen  DISPOSITION OF SPECIMEN:  N/A  COUNTS:  YES  TOURNIQUET:  * Missing tourniquet times found for documented tourniquets in log:  [id number] *  DICTATION: .Other Dictation: Dictation Number [id number]  PLAN OF CARE: Admit for overnight observation  PATIENT DISPOSITION:  PACU - hemodynamically stable.   Delay start of Pharmacological VTE agent (>24hrs) due to surgical blood loss or risk of bleeding: no

## 2014-07-17 ENCOUNTER — Encounter (HOSPITAL_COMMUNITY): Payer: Self-pay | Admitting: General Practice

## 2014-07-17 LAB — PROTIME-INR
INR: 1.61 — ABNORMAL HIGH (ref 0.00–1.49)
PROTHROMBIN TIME: 19.3 s — AB (ref 11.6–15.2)

## 2014-07-17 MED ORDER — POLYETHYLENE GLYCOL 3350 17 G PO PACK
17.0000 g | PACK | Freq: Every day | ORAL | Status: DC | PRN
  Administered 2014-07-17 – 2014-07-20 (×3): 17 g via ORAL
  Filled 2014-07-17 (×3): qty 1

## 2014-07-17 MED ORDER — DOCUSATE SODIUM 100 MG PO CAPS
100.0000 mg | ORAL_CAPSULE | Freq: Two times a day (BID) | ORAL | Status: DC | PRN
  Administered 2014-07-17: 100 mg via ORAL
  Filled 2014-07-17: qty 1

## 2014-07-17 MED ORDER — WARFARIN SODIUM 7.5 MG PO TABS
12.5000 mg | ORAL_TABLET | Freq: Once | ORAL | Status: AC
  Administered 2014-07-17: 12.5 mg via ORAL
  Filled 2014-07-17 (×2): qty 1

## 2014-07-17 MED ORDER — BISACODYL 10 MG RE SUPP: 10.0000 mg | Freq: Every day | RECTAL | Status: DC | PRN

## 2014-07-17 NOTE — Progress Notes (Signed)
UR completed 
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
## 2014-07-17 NOTE — Plan of Care (Signed)
Problem: Phase II Progression Outcomes Goal: Pain controlled Outcome: Completed/Met Date Met:  07/17/14 With PO medications

## 2014-07-17 NOTE — Progress Notes (Signed)
Subjective: 1 Day Post-Op Procedure(s) (LRB): OPEN REDUCTION INTERNAL (ORIF) FIXATION RIGHT PATELLA FRACTURE (Right) Patient reports pain as moderate.    Objective: Vital signs in last 24 hours: Temp:  [97.1 F (36.2 C)-99 F (37.2 C)] 98.6 F (37 C) (03/04 0603) Pulse Rate:  [70-97] 74 (03/04 0603) Resp:  [12-25] 18 (03/04 0603) BP: (119-169)/(45-63) 124/56 mmHg (03/04 0603) SpO2:  [92 %-100 %] 96 % (03/04 0603) Weight:  [74.844 kg (165 lb)] 74.844 kg (165 lb) (03/03 0843)  Intake/Output from previous day: 03/03 0701 - 03/04 0700 In: 2595 [P.O.:180; I.V.:2265; IV Piggyback:150] Out: -  Intake/Output this shift:     Recent Labs  07/16/14 0858  HGB 13.5    Recent Labs  07/16/14 0858  WBC 14.6*  RBC 4.51  HCT 40.9  PLT 472*    Recent Labs  07/16/14 0858  NA 137  K 4.1  CL 100  CO2 24  BUN 20  CREATININE 0.78  GLUCOSE 144*  CALCIUM 8.9    Recent Labs  07/16/14 0858 07/17/14 0605  INR 1.72* 1.61*    Sensation intact distally Intact pulses distally Dorsiflexion/Plantar flexion intact Incision: dressing C/D/I Compartment soft  Assessment/Plan: 1 Day Post-Op Procedure(s) (LRB): OPEN REDUCTION INTERNAL (ORIF) FIXATION RIGHT PATELLA FRACTURE (Right) Up with therapy  WBAT right leg in brace; no right knee flexion. Disposition pending therapy eval.  Ana [name] Y 07/17/2014, 7:29 AM

## 2014-07-17 NOTE — Progress Notes (Signed)
Patient ID: Ana [name], female   DOB: Jan 21, 1946, 69 y.o.   MRN: [id number] Dressing changed right knee.  Incision looks good.  PT recommending home with 24 hour assistance.  Still needs good pain control.  Will likely keep her here until Monday, then home with HHPT.
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
## 2014-07-18 LAB — PROTIME-INR
INR: 2.02 — AB (ref 0.00–1.49)
Prothrombin Time: 23.1 seconds — ABNORMAL HIGH (ref 11.6–15.2)

## 2014-07-18 MED ORDER — BISACODYL 10 MG RE SUPP
10.0000 mg | Freq: Once | RECTAL | Status: AC
  Administered 2014-07-18: 10 mg via RECTAL
  Filled 2014-07-18: qty 1

## 2014-07-18 MED ORDER — FLEET ENEMA 7-19 GM/118ML RE ENEM: 1.0000 | ENEMA | Freq: Every day | RECTAL | Status: DC | PRN

## 2014-07-18 MED ORDER — WARFARIN SODIUM 5 MG PO TABS: 10.0000 mg | ORAL_TABLET | Freq: Once | ORAL | Status: DC
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
## 2014-07-18 NOTE — Progress Notes (Signed)
Utilization review completed.  P.J. Rainer Mounce,RN,BSN Case Manager 

## 2014-07-19 DIAGNOSIS — Z7901 Long term (current) use of anticoagulants: Secondary | ICD-10-CM | POA: Diagnosis not present

## 2014-07-19 DIAGNOSIS — F329 Major depressive disorder, single episode, unspecified: Secondary | ICD-10-CM | POA: Diagnosis present

## 2014-07-19 DIAGNOSIS — R5382 Chronic fatigue, unspecified: Secondary | ICD-10-CM | POA: Diagnosis present

## 2014-07-19 DIAGNOSIS — Z79899 Other long term (current) drug therapy: Secondary | ICD-10-CM | POA: Diagnosis not present

## 2014-07-19 DIAGNOSIS — K589 Irritable bowel syndrome without diarrhea: Secondary | ICD-10-CM | POA: Diagnosis present

## 2014-07-19 DIAGNOSIS — Z531 Procedure and treatment not carried out because of patient's decision for reasons of belief and group pressure: Secondary | ICD-10-CM | POA: Diagnosis present

## 2014-07-19 DIAGNOSIS — M25561 Pain in right knee: Secondary | ICD-10-CM | POA: Diagnosis not present

## 2014-07-19 DIAGNOSIS — Z952 Presence of prosthetic heart valve: Secondary | ICD-10-CM | POA: Diagnosis not present

## 2014-07-19 DIAGNOSIS — Z87891 Personal history of nicotine dependence: Secondary | ICD-10-CM | POA: Diagnosis not present

## 2014-07-19 DIAGNOSIS — E785 Hyperlipidemia, unspecified: Secondary | ICD-10-CM | POA: Diagnosis present

## 2014-07-19 DIAGNOSIS — H5442 Blindness, left eye, normal vision right eye: Secondary | ICD-10-CM | POA: Diagnosis present

## 2014-07-19 DIAGNOSIS — I10 Essential (primary) hypertension: Secondary | ICD-10-CM | POA: Diagnosis present

## 2014-07-19 DIAGNOSIS — S82041A Displaced comminuted fracture of right patella, initial encounter for closed fracture: Secondary | ICD-10-CM | POA: Diagnosis present

## 2014-07-19 DIAGNOSIS — Z8249 Family history of ischemic heart disease and other diseases of the circulatory system: Secondary | ICD-10-CM | POA: Diagnosis not present

## 2014-07-19 LAB — PROTIME-INR
INR: 2.12 — ABNORMAL HIGH (ref 0.00–1.49)
Prothrombin Time: 23.9 seconds — ABNORMAL HIGH (ref 11.6–15.2)

## 2014-07-19 MED ORDER — WARFARIN SODIUM 7.5 MG PO TABS
12.5000 mg | ORAL_TABLET | ORAL | Status: AC
  Administered 2014-07-19: 12.5 mg via ORAL
  Filled 2014-07-19 (×2): qty 1
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
## 2014-07-19 NOTE — Progress Notes (Signed)
Utilization review completed.  P.J. Eve Rey,RN,BSN Case Manager 
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
## 2014-07-19 NOTE — Progress Notes (Signed)
Subjective: 3 Days Post-Op Procedure(s) (LRB): OPEN REDUCTION INTERNAL (ORIF) FIXATION RIGHT PATELLA FRACTURE (Right) Patient reports pain as moderate.    Objective: Vital signs in last 24 hours: Temp:  [97.9 F (36.6 C)-98.3 F (36.8 C)] 97.9 F (36.6 C) (03/06 0500) Pulse Rate:  [72-92] 88 (03/06 0500) Resp:  [16-18] 18 (03/06 0500) BP: (148-158)/(52-77) 158/77 mmHg (03/06 0500) SpO2:  [95 %-98 %] 96 % (03/06 0500)  Intake/Output from previous day: 03/05 0701 - 03/06 0700 In: 1080 [P.O.:1080] Out: -  Intake/Output this shift:    No results for input(s): HGB in the last 72 hours. No results for input(s): WBC, RBC, HCT, PLT in the last 72 hours. No results for input(s): NA, K, CL, CO2, BUN, CREATININE, GLUCOSE, CALCIUM in the last 72 hours.  Recent Labs  07/18/14 0417 07/19/14 0457  INR 2.02* 2.12*    Neurologically intact  Assessment/Plan: 3 Days Post-Op Procedure(s) (LRB): OPEN REDUCTION INTERNAL (ORIF) FIXATION RIGHT PATELLA FRACTURE (Right) Up with therapy  YATES,MARK C 07/19/2014, 9:27 AM

## 2014-07-20 LAB — PROTIME-INR
INR: 2.17 — AB (ref 0.00–1.49)
INR: 2.2 — ABNORMAL HIGH (ref 0.00–1.49)
Prothrombin Time: 24.4 seconds — ABNORMAL HIGH (ref 11.6–15.2)
Prothrombin Time: 24.6 seconds — ABNORMAL HIGH (ref 11.6–15.2)

## 2014-07-20 MED ORDER — OXYCODONE-ACETAMINOPHEN 5-325 MG PO TABS: 1.0000 | ORAL_TABLET | ORAL | Status: DC | PRN

## 2014-07-20 MED ORDER — VITAMIN K1 10 MG/ML IJ SOLN: 10.0000 mg | Freq: Once | INTRAMUSCULAR | Status: DC

## 2014-07-20 MED ORDER — METHOCARBAMOL 500 MG PO TABS: 500.0000 mg | ORAL_TABLET | Freq: Four times a day (QID) | ORAL | Status: DC | PRN
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
## 2014-07-20 NOTE — Progress Notes (Signed)
Patient ID: Ana [name], female   DOB: May 28, 1945, 69 y.o.   MRN: [id number] Repeat INR was 2.2.  I let her know that she can resume her home Coumadin dose including tonight.

## 2014-07-20 NOTE — Progress Notes (Signed)
PT Cancellation Note  Patient Details Name: Ana [name] MRN: [id number] DOB: 07-13-1945   Cancelled Treatment:    Reason Eval/Treat Not Completed: Medical issues which prohibited therapy (INR >10.00) Did not treat patient in the morning 2/2 high INR >10.00.  Checked with RN at 12:00pm and pt is awaiting another INR report before proceeding with any treatment.  Pt is anticipating d/c today.  Joslyn Hy PT, DPT [phone]  07/20/2014, 2:17 PM
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
## 2014-07-23 LAB — PROTIME-INR: INR: >10 (ref 0.00–1.49)

## 2014-07-30 DIAGNOSIS — S82041D Displaced comminuted fracture of right patella, subsequent encounter for closed fracture with routine healing: Secondary | ICD-10-CM | POA: Diagnosis not present

## 2014-08-13 DIAGNOSIS — Z8679 Personal history of other diseases of the circulatory system: Secondary | ICD-10-CM | POA: Diagnosis not present

## 2014-08-13 DIAGNOSIS — S82041D Displaced comminuted fracture of right patella, subsequent encounter for closed fracture with routine healing: Secondary | ICD-10-CM | POA: Diagnosis not present

## 2014-08-13 DIAGNOSIS — Z7901 Long term (current) use of anticoagulants: Secondary | ICD-10-CM | POA: Diagnosis not present

## 2014-08-27 DIAGNOSIS — Z8679 Personal history of other diseases of the circulatory system: Secondary | ICD-10-CM | POA: Diagnosis not present

## 2014-08-27 DIAGNOSIS — S82041D Displaced comminuted fracture of right patella, subsequent encounter for closed fracture with routine healing: Secondary | ICD-10-CM | POA: Diagnosis not present

## 2014-08-27 DIAGNOSIS — I482 Chronic atrial fibrillation: Secondary | ICD-10-CM | POA: Diagnosis not present

## 2014-08-27 DIAGNOSIS — Z7901 Long term (current) use of anticoagulants: Secondary | ICD-10-CM | POA: Diagnosis not present

## 2014-09-10 DIAGNOSIS — S82001D Unspecified fracture of right patella, subsequent encounter for closed fracture with routine healing: Secondary | ICD-10-CM | POA: Diagnosis not present

## 2014-09-10 DIAGNOSIS — S82041D Displaced comminuted fracture of right patella, subsequent encounter for closed fracture with routine healing: Secondary | ICD-10-CM | POA: Diagnosis not present

## 2014-09-18 DIAGNOSIS — Z7901 Long term (current) use of anticoagulants: Secondary | ICD-10-CM | POA: Diagnosis not present

## 2014-09-18 DIAGNOSIS — I482 Chronic atrial fibrillation: Secondary | ICD-10-CM | POA: Diagnosis not present

## 2014-09-21 ENCOUNTER — Other Ambulatory Visit: Payer: Self-pay | Admitting: Cardiology

## 2014-09-21 DIAGNOSIS — Z7901 Long term (current) use of anticoagulants: Secondary | ICD-10-CM | POA: Diagnosis not present

## 2014-09-21 DIAGNOSIS — Z952 Presence of prosthetic heart valve: Secondary | ICD-10-CM | POA: Diagnosis not present

## 2014-09-21 DIAGNOSIS — I10 Essential (primary) hypertension: Secondary | ICD-10-CM

## 2014-09-21 MED ORDER — LISINOPRIL 5 MG PO TABS: 5.0000 mg | ORAL_TABLET | Freq: Every day | ORAL | Status: DC

## 2014-09-21 NOTE — Telephone Encounter (Signed)
Received fax refill request  Rx # [id number] Medication:  Lisinopril 5 mg tablet Qty 90 Sig:  Take one tablet by mouth once daily Physician:  Harl Bowie

## 2014-09-21 NOTE — Telephone Encounter (Signed)
Refill complete 

## 2014-10-02 DIAGNOSIS — Z7901 Long term (current) use of anticoagulants: Secondary | ICD-10-CM | POA: Diagnosis not present

## 2014-10-02 DIAGNOSIS — Z8679 Personal history of other diseases of the circulatory system: Secondary | ICD-10-CM | POA: Diagnosis not present

## 2014-10-02 DIAGNOSIS — Z952 Presence of prosthetic heart valve: Secondary | ICD-10-CM | POA: Diagnosis not present

## 2014-10-02 DIAGNOSIS — I482 Chronic atrial fibrillation: Secondary | ICD-10-CM | POA: Diagnosis not present

## 2014-10-20 DIAGNOSIS — M25661 Stiffness of right knee, not elsewhere classified: Secondary | ICD-10-CM | POA: Diagnosis not present

## 2014-10-20 DIAGNOSIS — M25561 Pain in right knee: Secondary | ICD-10-CM | POA: Diagnosis not present

## 2014-10-20 DIAGNOSIS — M6281 Muscle weakness (generalized): Secondary | ICD-10-CM | POA: Diagnosis not present

## 2014-10-20 DIAGNOSIS — R262 Difficulty in walking, not elsewhere classified: Secondary | ICD-10-CM | POA: Diagnosis not present

## 2014-10-21 DIAGNOSIS — Z79899 Other long term (current) drug therapy: Secondary | ICD-10-CM | POA: Diagnosis not present

## 2014-10-21 DIAGNOSIS — Z7901 Long term (current) use of anticoagulants: Secondary | ICD-10-CM | POA: Diagnosis not present

## 2014-10-23 DIAGNOSIS — M25661 Stiffness of right knee, not elsewhere classified: Secondary | ICD-10-CM | POA: Diagnosis not present

## 2014-10-23 DIAGNOSIS — M25561 Pain in right knee: Secondary | ICD-10-CM | POA: Diagnosis not present

## 2014-10-23 DIAGNOSIS — R262 Difficulty in walking, not elsewhere classified: Secondary | ICD-10-CM | POA: Diagnosis not present

## 2014-10-23 DIAGNOSIS — M6281 Muscle weakness (generalized): Secondary | ICD-10-CM | POA: Diagnosis not present

## 2014-10-26 DIAGNOSIS — M25661 Stiffness of right knee, not elsewhere classified: Secondary | ICD-10-CM | POA: Diagnosis not present

## 2014-10-26 DIAGNOSIS — M6281 Muscle weakness (generalized): Secondary | ICD-10-CM | POA: Diagnosis not present

## 2014-10-26 DIAGNOSIS — M25561 Pain in right knee: Secondary | ICD-10-CM | POA: Diagnosis not present

## 2014-10-26 DIAGNOSIS — R262 Difficulty in walking, not elsewhere classified: Secondary | ICD-10-CM | POA: Diagnosis not present

## 2014-10-28 DIAGNOSIS — R262 Difficulty in walking, not elsewhere classified: Secondary | ICD-10-CM | POA: Diagnosis not present

## 2014-10-28 DIAGNOSIS — M6281 Muscle weakness (generalized): Secondary | ICD-10-CM | POA: Diagnosis not present

## 2014-10-28 DIAGNOSIS — M25561 Pain in right knee: Secondary | ICD-10-CM | POA: Diagnosis not present

## 2014-10-28 DIAGNOSIS — M25661 Stiffness of right knee, not elsewhere classified: Secondary | ICD-10-CM | POA: Diagnosis not present

## 2014-11-02 DIAGNOSIS — M25561 Pain in right knee: Secondary | ICD-10-CM | POA: Diagnosis not present

## 2014-11-02 DIAGNOSIS — M6281 Muscle weakness (generalized): Secondary | ICD-10-CM | POA: Diagnosis not present

## 2014-11-02 DIAGNOSIS — R262 Difficulty in walking, not elsewhere classified: Secondary | ICD-10-CM | POA: Diagnosis not present

## 2014-11-02 DIAGNOSIS — M25661 Stiffness of right knee, not elsewhere classified: Secondary | ICD-10-CM | POA: Diagnosis not present

## 2014-11-04 DIAGNOSIS — M6281 Muscle weakness (generalized): Secondary | ICD-10-CM | POA: Diagnosis not present

## 2014-11-04 DIAGNOSIS — M25661 Stiffness of right knee, not elsewhere classified: Secondary | ICD-10-CM | POA: Diagnosis not present

## 2014-11-04 DIAGNOSIS — R262 Difficulty in walking, not elsewhere classified: Secondary | ICD-10-CM | POA: Diagnosis not present

## 2014-11-04 DIAGNOSIS — M25561 Pain in right knee: Secondary | ICD-10-CM | POA: Diagnosis not present

## 2014-11-05 DIAGNOSIS — S82041D Displaced comminuted fracture of right patella, subsequent encounter for closed fracture with routine healing: Secondary | ICD-10-CM | POA: Diagnosis not present

## 2014-11-05 DIAGNOSIS — S82001D Unspecified fracture of right patella, subsequent encounter for closed fracture with routine healing: Secondary | ICD-10-CM | POA: Diagnosis not present

## 2014-11-05 DIAGNOSIS — S82041A Displaced comminuted fracture of right patella, initial encounter for closed fracture: Secondary | ICD-10-CM | POA: Diagnosis not present

## 2014-11-09 DIAGNOSIS — M25661 Stiffness of right knee, not elsewhere classified: Secondary | ICD-10-CM | POA: Diagnosis not present

## 2014-11-09 DIAGNOSIS — M6281 Muscle weakness (generalized): Secondary | ICD-10-CM | POA: Diagnosis not present

## 2014-11-09 DIAGNOSIS — M25561 Pain in right knee: Secondary | ICD-10-CM | POA: Diagnosis not present

## 2014-11-09 DIAGNOSIS — R262 Difficulty in walking, not elsewhere classified: Secondary | ICD-10-CM | POA: Diagnosis not present

## 2014-11-10 DIAGNOSIS — N959 Unspecified menopausal and perimenopausal disorder: Secondary | ICD-10-CM | POA: Diagnosis not present

## 2014-11-10 DIAGNOSIS — R5383 Other fatigue: Secondary | ICD-10-CM | POA: Diagnosis not present

## 2014-11-10 DIAGNOSIS — E049 Nontoxic goiter, unspecified: Secondary | ICD-10-CM | POA: Diagnosis not present

## 2014-11-10 DIAGNOSIS — I10 Essential (primary) hypertension: Secondary | ICD-10-CM | POA: Diagnosis not present

## 2014-11-11 DIAGNOSIS — M25661 Stiffness of right knee, not elsewhere classified: Secondary | ICD-10-CM | POA: Diagnosis not present

## 2014-11-11 DIAGNOSIS — R262 Difficulty in walking, not elsewhere classified: Secondary | ICD-10-CM | POA: Diagnosis not present

## 2014-11-11 DIAGNOSIS — M6281 Muscle weakness (generalized): Secondary | ICD-10-CM | POA: Diagnosis not present

## 2014-11-11 DIAGNOSIS — M25561 Pain in right knee: Secondary | ICD-10-CM | POA: Diagnosis not present

## 2014-11-12 DIAGNOSIS — M6281 Muscle weakness (generalized): Secondary | ICD-10-CM | POA: Diagnosis not present

## 2014-11-12 DIAGNOSIS — M25661 Stiffness of right knee, not elsewhere classified: Secondary | ICD-10-CM | POA: Diagnosis not present

## 2014-11-12 DIAGNOSIS — R262 Difficulty in walking, not elsewhere classified: Secondary | ICD-10-CM | POA: Diagnosis not present

## 2014-11-12 DIAGNOSIS — M25561 Pain in right knee: Secondary | ICD-10-CM | POA: Diagnosis not present

## 2014-11-17 DIAGNOSIS — M6281 Muscle weakness (generalized): Secondary | ICD-10-CM | POA: Diagnosis not present

## 2014-11-17 DIAGNOSIS — R262 Difficulty in walking, not elsewhere classified: Secondary | ICD-10-CM | POA: Diagnosis not present

## 2014-11-17 DIAGNOSIS — M25661 Stiffness of right knee, not elsewhere classified: Secondary | ICD-10-CM | POA: Diagnosis not present

## 2014-11-17 DIAGNOSIS — M25561 Pain in right knee: Secondary | ICD-10-CM | POA: Diagnosis not present

## 2014-11-18 DIAGNOSIS — M6281 Muscle weakness (generalized): Secondary | ICD-10-CM | POA: Diagnosis not present

## 2014-11-18 DIAGNOSIS — R262 Difficulty in walking, not elsewhere classified: Secondary | ICD-10-CM | POA: Diagnosis not present

## 2014-11-18 DIAGNOSIS — M25661 Stiffness of right knee, not elsewhere classified: Secondary | ICD-10-CM | POA: Diagnosis not present

## 2014-11-18 DIAGNOSIS — M25561 Pain in right knee: Secondary | ICD-10-CM | POA: Diagnosis not present

## 2014-11-23 ENCOUNTER — Other Ambulatory Visit (HOSPITAL_COMMUNITY): Payer: Self-pay | Admitting: Family Medicine

## 2014-11-23 DIAGNOSIS — M25661 Stiffness of right knee, not elsewhere classified: Secondary | ICD-10-CM | POA: Diagnosis not present

## 2014-11-23 DIAGNOSIS — M25561 Pain in right knee: Secondary | ICD-10-CM | POA: Diagnosis not present

## 2014-11-23 DIAGNOSIS — R262 Difficulty in walking, not elsewhere classified: Secondary | ICD-10-CM | POA: Diagnosis not present

## 2014-11-23 DIAGNOSIS — M6281 Muscle weakness (generalized): Secondary | ICD-10-CM | POA: Diagnosis not present

## 2014-11-23 DIAGNOSIS — E042 Nontoxic multinodular goiter: Secondary | ICD-10-CM

## 2014-11-25 DIAGNOSIS — M25661 Stiffness of right knee, not elsewhere classified: Secondary | ICD-10-CM | POA: Diagnosis not present

## 2014-11-25 DIAGNOSIS — M6281 Muscle weakness (generalized): Secondary | ICD-10-CM | POA: Diagnosis not present

## 2014-11-25 DIAGNOSIS — M25561 Pain in right knee: Secondary | ICD-10-CM | POA: Diagnosis not present

## 2014-11-25 DIAGNOSIS — R262 Difficulty in walking, not elsewhere classified: Secondary | ICD-10-CM | POA: Diagnosis not present

## 2014-11-26 DIAGNOSIS — R262 Difficulty in walking, not elsewhere classified: Secondary | ICD-10-CM | POA: Diagnosis not present

## 2014-11-26 DIAGNOSIS — M25561 Pain in right knee: Secondary | ICD-10-CM | POA: Diagnosis not present

## 2014-11-26 DIAGNOSIS — M6281 Muscle weakness (generalized): Secondary | ICD-10-CM | POA: Diagnosis not present

## 2014-11-26 DIAGNOSIS — M25661 Stiffness of right knee, not elsewhere classified: Secondary | ICD-10-CM | POA: Diagnosis not present

## 2014-11-30 DIAGNOSIS — R262 Difficulty in walking, not elsewhere classified: Secondary | ICD-10-CM | POA: Diagnosis not present

## 2014-11-30 DIAGNOSIS — M25661 Stiffness of right knee, not elsewhere classified: Secondary | ICD-10-CM | POA: Diagnosis not present

## 2014-11-30 DIAGNOSIS — M25561 Pain in right knee: Secondary | ICD-10-CM | POA: Diagnosis not present

## 2014-11-30 DIAGNOSIS — M6281 Muscle weakness (generalized): Secondary | ICD-10-CM | POA: Diagnosis not present

## 2014-12-01 ENCOUNTER — Ambulatory Visit (HOSPITAL_COMMUNITY)
Admission: RE | Admit: 2014-12-01 | Discharge: 2014-12-01 | Disposition: A | Discharge status: Home / Self Care | Payer: Medicare Other | Source: Ambulatory Visit | Attending: Family Medicine | Admitting: Family Medicine

## 2014-12-01 DIAGNOSIS — E042 Nontoxic multinodular goiter: Secondary | ICD-10-CM | POA: Diagnosis not present

## 2014-12-01 DIAGNOSIS — E049 Nontoxic goiter, unspecified: Secondary | ICD-10-CM | POA: Insufficient documentation

## 2014-12-02 DIAGNOSIS — R262 Difficulty in walking, not elsewhere classified: Secondary | ICD-10-CM | POA: Diagnosis not present

## 2014-12-02 DIAGNOSIS — M25561 Pain in right knee: Secondary | ICD-10-CM | POA: Diagnosis not present

## 2014-12-02 DIAGNOSIS — M25661 Stiffness of right knee, not elsewhere classified: Secondary | ICD-10-CM | POA: Diagnosis not present

## 2014-12-02 DIAGNOSIS — M6281 Muscle weakness (generalized): Secondary | ICD-10-CM | POA: Diagnosis not present

## 2014-12-03 DIAGNOSIS — M6281 Muscle weakness (generalized): Secondary | ICD-10-CM | POA: Diagnosis not present

## 2014-12-03 DIAGNOSIS — R262 Difficulty in walking, not elsewhere classified: Secondary | ICD-10-CM | POA: Diagnosis not present

## 2014-12-03 DIAGNOSIS — M25561 Pain in right knee: Secondary | ICD-10-CM | POA: Diagnosis not present

## 2014-12-03 DIAGNOSIS — M25661 Stiffness of right knee, not elsewhere classified: Secondary | ICD-10-CM | POA: Diagnosis not present

## 2014-12-07 DIAGNOSIS — M6281 Muscle weakness (generalized): Secondary | ICD-10-CM | POA: Diagnosis not present

## 2014-12-07 DIAGNOSIS — M25661 Stiffness of right knee, not elsewhere classified: Secondary | ICD-10-CM | POA: Diagnosis not present

## 2014-12-07 DIAGNOSIS — M25561 Pain in right knee: Secondary | ICD-10-CM | POA: Diagnosis not present

## 2014-12-07 DIAGNOSIS — R262 Difficulty in walking, not elsewhere classified: Secondary | ICD-10-CM | POA: Diagnosis not present

## 2014-12-09 DIAGNOSIS — M25661 Stiffness of right knee, not elsewhere classified: Secondary | ICD-10-CM | POA: Diagnosis not present

## 2014-12-09 DIAGNOSIS — M6281 Muscle weakness (generalized): Secondary | ICD-10-CM | POA: Diagnosis not present

## 2014-12-09 DIAGNOSIS — R262 Difficulty in walking, not elsewhere classified: Secondary | ICD-10-CM | POA: Diagnosis not present

## 2014-12-09 DIAGNOSIS — M25561 Pain in right knee: Secondary | ICD-10-CM | POA: Diagnosis not present

## 2014-12-10 DIAGNOSIS — R262 Difficulty in walking, not elsewhere classified: Secondary | ICD-10-CM | POA: Diagnosis not present

## 2014-12-10 DIAGNOSIS — M6281 Muscle weakness (generalized): Secondary | ICD-10-CM | POA: Diagnosis not present

## 2014-12-10 DIAGNOSIS — M25561 Pain in right knee: Secondary | ICD-10-CM | POA: Diagnosis not present

## 2014-12-10 DIAGNOSIS — M25661 Stiffness of right knee, not elsewhere classified: Secondary | ICD-10-CM | POA: Diagnosis not present

## 2014-12-14 ENCOUNTER — Encounter: Payer: Self-pay | Admitting: Internal Medicine

## 2014-12-14 DIAGNOSIS — R262 Difficulty in walking, not elsewhere classified: Secondary | ICD-10-CM | POA: Diagnosis not present

## 2014-12-14 DIAGNOSIS — M25561 Pain in right knee: Secondary | ICD-10-CM | POA: Diagnosis not present

## 2014-12-14 DIAGNOSIS — M6281 Muscle weakness (generalized): Secondary | ICD-10-CM | POA: Diagnosis not present

## 2014-12-14 DIAGNOSIS — M25661 Stiffness of right knee, not elsewhere classified: Secondary | ICD-10-CM | POA: Diagnosis not present

## 2014-12-15 DIAGNOSIS — Z7901 Long term (current) use of anticoagulants: Secondary | ICD-10-CM | POA: Diagnosis not present

## 2014-12-16 DIAGNOSIS — M6281 Muscle weakness (generalized): Secondary | ICD-10-CM | POA: Diagnosis not present

## 2014-12-16 DIAGNOSIS — M25561 Pain in right knee: Secondary | ICD-10-CM | POA: Diagnosis not present

## 2014-12-16 DIAGNOSIS — R262 Difficulty in walking, not elsewhere classified: Secondary | ICD-10-CM | POA: Diagnosis not present

## 2014-12-16 DIAGNOSIS — M25661 Stiffness of right knee, not elsewhere classified: Secondary | ICD-10-CM | POA: Diagnosis not present

## 2014-12-17 DIAGNOSIS — S82041D Displaced comminuted fracture of right patella, subsequent encounter for closed fracture with routine healing: Secondary | ICD-10-CM | POA: Diagnosis not present

## 2015-01-13 DIAGNOSIS — H9203 Otalgia, bilateral: Secondary | ICD-10-CM | POA: Diagnosis not present

## 2015-01-13 DIAGNOSIS — R1084 Generalized abdominal pain: Secondary | ICD-10-CM | POA: Diagnosis not present

## 2015-01-13 DIAGNOSIS — Z7901 Long term (current) use of anticoagulants: Secondary | ICD-10-CM | POA: Diagnosis not present

## 2015-01-13 DIAGNOSIS — I10 Essential (primary) hypertension: Secondary | ICD-10-CM | POA: Diagnosis not present

## 2015-04-14 ENCOUNTER — Encounter: Payer: Self-pay | Admitting: Cardiology

## 2015-04-14 ENCOUNTER — Ambulatory Visit (INDEPENDENT_AMBULATORY_CARE_PROVIDER_SITE_OTHER): Payer: Medicare Other | Admitting: Cardiology

## 2015-04-14 VITALS — BP 128/78 | HR 88 | Ht 64.0 in | Wt 164.0 lb

## 2015-04-14 DIAGNOSIS — I351 Nonrheumatic aortic (valve) insufficiency: Secondary | ICD-10-CM

## 2015-04-14 DIAGNOSIS — I10 Essential (primary) hypertension: Secondary | ICD-10-CM | POA: Diagnosis not present

## 2015-04-14 DIAGNOSIS — R0789 Other chest pain: Secondary | ICD-10-CM | POA: Diagnosis not present

## 2015-04-14 DIAGNOSIS — E785 Hyperlipidemia, unspecified: Secondary | ICD-10-CM | POA: Diagnosis not present
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
## 2015-04-14 NOTE — Patient Instructions (Signed)
Medication Instructions:  Your physician recommends that you continue on your current medications as directed. Please refer to the Current Medication list given to you today.  Labwork: NONE  Testing/Procedures: NONE  Follow-Up: Your physician wants you to follow-up in: 1 YEAR WITH DR. BRANCH. You will receive a reminder letter in the mail two months in advance. If you don't receive a letter, please call our office to schedule the follow-up appointment.  Any Other Special Instructions Will Be Listed Below (If Applicable).  If you need a refill on your cardiac medications before your next appointment, please call your pharmacy. 

## 2015-05-25 ENCOUNTER — Other Ambulatory Visit (HOSPITAL_COMMUNITY): Payer: Self-pay | Admitting: Family Medicine

## 2015-05-25 DIAGNOSIS — E041 Nontoxic single thyroid nodule: Secondary | ICD-10-CM

## 2015-06-02 ENCOUNTER — Ambulatory Visit (HOSPITAL_COMMUNITY)
Admission: RE | Admit: 2015-06-02 | Discharge: 2015-06-02 | Disposition: A | Discharge status: Home / Self Care | Payer: Medicare Other | Source: Ambulatory Visit | Attending: Family Medicine | Admitting: Family Medicine

## 2015-06-02 DIAGNOSIS — E042 Nontoxic multinodular goiter: Secondary | ICD-10-CM | POA: Insufficient documentation

## 2015-06-02 DIAGNOSIS — E041 Nontoxic single thyroid nodule: Secondary | ICD-10-CM

## 2015-06-15 DIAGNOSIS — H35031 Hypertensive retinopathy, right eye: Secondary | ICD-10-CM | POA: Diagnosis not present

## 2015-06-15 DIAGNOSIS — H524 Presbyopia: Secondary | ICD-10-CM | POA: Diagnosis not present

## 2015-06-15 DIAGNOSIS — H5211 Myopia, right eye: Secondary | ICD-10-CM | POA: Diagnosis not present

## 2015-06-15 DIAGNOSIS — I10 Essential (primary) hypertension: Secondary | ICD-10-CM | POA: Diagnosis not present

## 2015-06-21 DIAGNOSIS — I10 Essential (primary) hypertension: Secondary | ICD-10-CM | POA: Diagnosis not present

## 2015-06-21 DIAGNOSIS — R5383 Other fatigue: Secondary | ICD-10-CM | POA: Diagnosis not present

## 2015-06-21 DIAGNOSIS — E049 Nontoxic goiter, unspecified: Secondary | ICD-10-CM | POA: Diagnosis not present

## 2015-10-28 DIAGNOSIS — I482 Chronic atrial fibrillation: Secondary | ICD-10-CM | POA: Diagnosis not present

## 2015-10-28 DIAGNOSIS — Z9119 Patient's noncompliance with other medical treatment and regimen: Secondary | ICD-10-CM | POA: Diagnosis not present

## 2015-12-20 ENCOUNTER — Other Ambulatory Visit (HOSPITAL_COMMUNITY): Payer: Self-pay | Admitting: Family Medicine

## 2015-12-20 DIAGNOSIS — E049 Nontoxic goiter, unspecified: Secondary | ICD-10-CM

## 2015-12-23 ENCOUNTER — Ambulatory Visit (HOSPITAL_COMMUNITY)
Admission: RE | Admit: 2015-12-23 | Discharge: 2015-12-23 | Disposition: A | Discharge status: Home / Self Care | Payer: Medicare Other | Source: Ambulatory Visit | Attending: Family Medicine | Admitting: Family Medicine

## 2015-12-23 DIAGNOSIS — E042 Nontoxic multinodular goiter: Secondary | ICD-10-CM | POA: Diagnosis not present

## 2015-12-23 DIAGNOSIS — E049 Nontoxic goiter, unspecified: Secondary | ICD-10-CM

## 2016-01-07 DIAGNOSIS — I10 Essential (primary) hypertension: Secondary | ICD-10-CM | POA: Diagnosis not present

## 2016-01-07 DIAGNOSIS — R5381 Other malaise: Secondary | ICD-10-CM | POA: Diagnosis not present

## 2016-01-07 DIAGNOSIS — E049 Nontoxic goiter, unspecified: Secondary | ICD-10-CM | POA: Diagnosis not present

## 2016-01-07 DIAGNOSIS — N959 Unspecified menopausal and perimenopausal disorder: Secondary | ICD-10-CM | POA: Diagnosis not present

## 2016-01-07 DIAGNOSIS — D649 Anemia, unspecified: Secondary | ICD-10-CM | POA: Diagnosis not present

## 2016-01-28 ENCOUNTER — Ambulatory Visit (HOSPITAL_COMMUNITY)
Admission: RE | Admit: 2016-01-28 | Discharge: 2016-01-28 | Disposition: A | Discharge status: Home / Self Care | Payer: Medicare Other | Source: Ambulatory Visit | Attending: Family Medicine | Admitting: Family Medicine

## 2016-01-28 ENCOUNTER — Other Ambulatory Visit (HOSPITAL_COMMUNITY): Payer: Self-pay | Admitting: Family Medicine

## 2016-01-28 DIAGNOSIS — R05 Cough: Secondary | ICD-10-CM | POA: Diagnosis not present

## 2016-01-28 DIAGNOSIS — R0989 Other specified symptoms and signs involving the circulatory and respiratory systems: Secondary | ICD-10-CM | POA: Diagnosis not present
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
## 2016-05-04 DIAGNOSIS — R5383 Other fatigue: Secondary | ICD-10-CM | POA: Diagnosis not present

## 2016-05-04 DIAGNOSIS — G4763 Sleep related bruxism: Secondary | ICD-10-CM | POA: Diagnosis not present

## 2016-05-10 DIAGNOSIS — E049 Nontoxic goiter, unspecified: Secondary | ICD-10-CM | POA: Diagnosis not present

## 2016-05-10 DIAGNOSIS — N951 Menopausal and female climacteric states: Secondary | ICD-10-CM | POA: Diagnosis not present

## 2016-05-10 DIAGNOSIS — R5381 Other malaise: Secondary | ICD-10-CM | POA: Diagnosis not present

## 2016-06-13 DIAGNOSIS — F458 Other somatoform disorders: Secondary | ICD-10-CM | POA: Diagnosis not present

## 2016-06-13 DIAGNOSIS — F438 Other reactions to severe stress: Secondary | ICD-10-CM | POA: Diagnosis not present

## 2016-06-13 DIAGNOSIS — I10 Essential (primary) hypertension: Secondary | ICD-10-CM | POA: Diagnosis not present

## 2016-06-13 DIAGNOSIS — R5383 Other fatigue: Secondary | ICD-10-CM | POA: Diagnosis not present

## 2016-08-22 ENCOUNTER — Other Ambulatory Visit (HOSPITAL_COMMUNITY): Payer: Self-pay | Admitting: Internal Medicine

## 2016-08-22 DIAGNOSIS — Z1231 Encounter for screening mammogram for malignant neoplasm of breast: Secondary | ICD-10-CM

## 2016-08-28 ENCOUNTER — Ambulatory Visit (HOSPITAL_COMMUNITY)
Admission: RE | Admit: 2016-08-28 | Discharge: 2016-08-28 | Disposition: A | Discharge status: Home / Self Care | Payer: Medicare Other | Source: Ambulatory Visit | Attending: Internal Medicine | Admitting: Internal Medicine

## 2016-08-28 DIAGNOSIS — Z1231 Encounter for screening mammogram for malignant neoplasm of breast: Secondary | ICD-10-CM | POA: Diagnosis present

## 2016-09-06 DIAGNOSIS — Z79899 Other long term (current) drug therapy: Secondary | ICD-10-CM | POA: Diagnosis not present

## 2016-09-06 DIAGNOSIS — R5383 Other fatigue: Secondary | ICD-10-CM | POA: Diagnosis not present

## 2016-09-06 DIAGNOSIS — G9009 Other idiopathic peripheral autonomic neuropathy: Secondary | ICD-10-CM | POA: Diagnosis not present

## 2016-09-06 DIAGNOSIS — K921 Melena: Secondary | ICD-10-CM | POA: Diagnosis not present

## 2016-09-06 DIAGNOSIS — M25551 Pain in right hip: Secondary | ICD-10-CM | POA: Diagnosis not present

## 2016-09-06 DIAGNOSIS — H05239 Hemorrhage of unspecified orbit: Secondary | ICD-10-CM | POA: Diagnosis not present

## 2016-09-06 DIAGNOSIS — D473 Essential (hemorrhagic) thrombocythemia: Secondary | ICD-10-CM | POA: Diagnosis not present

## 2016-09-06 DIAGNOSIS — M545 Low back pain: Secondary | ICD-10-CM | POA: Diagnosis not present

## 2016-09-06 DIAGNOSIS — R079 Chest pain, unspecified: Secondary | ICD-10-CM | POA: Diagnosis not present

## 2016-09-20 DIAGNOSIS — R5381 Other malaise: Secondary | ICD-10-CM | POA: Diagnosis not present

## 2016-09-20 DIAGNOSIS — E049 Nontoxic goiter, unspecified: Secondary | ICD-10-CM | POA: Diagnosis not present

## 2016-09-26 ENCOUNTER — Other Ambulatory Visit (HOSPITAL_COMMUNITY): Payer: Self-pay | Admitting: Family Medicine

## 2016-09-26 ENCOUNTER — Ambulatory Visit (HOSPITAL_COMMUNITY)
Admission: RE | Admit: 2016-09-26 | Discharge: 2016-09-26 | Disposition: A | Discharge status: Home / Self Care | Payer: Medicare Other | Source: Ambulatory Visit | Attending: Family Medicine | Admitting: Family Medicine

## 2016-09-26 DIAGNOSIS — R634 Abnormal weight loss: Secondary | ICD-10-CM | POA: Diagnosis not present

## 2016-09-26 DIAGNOSIS — R05 Cough: Secondary | ICD-10-CM

## 2016-09-26 DIAGNOSIS — M545 Low back pain: Secondary | ICD-10-CM | POA: Insufficient documentation

## 2016-09-26 DIAGNOSIS — R059 Cough, unspecified: Secondary | ICD-10-CM

## 2016-09-26 DIAGNOSIS — M5136 Other intervertebral disc degeneration, lumbar region: Secondary | ICD-10-CM | POA: Insufficient documentation

## 2016-09-26 DIAGNOSIS — M47816 Spondylosis without myelopathy or radiculopathy, lumbar region: Secondary | ICD-10-CM | POA: Diagnosis not present

## 2016-09-26 DIAGNOSIS — R1013 Epigastric pain: Secondary | ICD-10-CM

## 2016-09-26 DIAGNOSIS — R52 Pain, unspecified: Secondary | ICD-10-CM

## 2016-09-26 DIAGNOSIS — M25559 Pain in unspecified hip: Secondary | ICD-10-CM | POA: Diagnosis not present

## 2016-09-26 DIAGNOSIS — M25551 Pain in right hip: Secondary | ICD-10-CM

## 2016-09-26 DIAGNOSIS — M1611 Unilateral primary osteoarthritis, right hip: Secondary | ICD-10-CM | POA: Diagnosis not present

## 2016-10-03 ENCOUNTER — Ambulatory Visit (HOSPITAL_COMMUNITY)
Admission: RE | Admit: 2016-10-03 | Discharge: 2016-10-03 | Disposition: A | Discharge status: Home / Self Care | Payer: Medicare Other | Source: Ambulatory Visit | Attending: Family Medicine | Admitting: Family Medicine

## 2016-10-03 DIAGNOSIS — Z90710 Acquired absence of both cervix and uterus: Secondary | ICD-10-CM | POA: Diagnosis not present

## 2016-10-03 DIAGNOSIS — R1013 Epigastric pain: Secondary | ICD-10-CM | POA: Insufficient documentation

## 2016-10-03 DIAGNOSIS — R634 Abnormal weight loss: Secondary | ICD-10-CM | POA: Insufficient documentation

## 2016-10-03 DIAGNOSIS — K8020 Calculus of gallbladder without cholecystitis without obstruction: Secondary | ICD-10-CM | POA: Diagnosis not present

## 2016-10-17 ENCOUNTER — Telehealth: Payer: Self-pay | Admitting: General Practice

## 2016-10-17 NOTE — Telephone Encounter (Signed)
Patient received a letter to schedule her first ever screening tcs.    She is on blood thinners.    Patient is scheduled to see LL on 11/30/16 at 1:30.

## 2016-10-18 DIAGNOSIS — S30860A Insect bite (nonvenomous) of lower back and pelvis, initial encounter: Secondary | ICD-10-CM | POA: Diagnosis not present

## 2016-10-24 ENCOUNTER — Encounter (HOSPITAL_COMMUNITY): Payer: Medicare Other | Attending: Oncology | Admitting: Oncology

## 2016-10-24 ENCOUNTER — Encounter (HOSPITAL_COMMUNITY): Payer: Medicare Other

## 2016-10-24 ENCOUNTER — Encounter (HOSPITAL_COMMUNITY): Payer: Self-pay | Admitting: Oncology

## 2016-10-24 ENCOUNTER — Ambulatory Visit (HOSPITAL_COMMUNITY): Payer: Medicare Other

## 2016-10-24 DIAGNOSIS — Z952 Presence of prosthetic heart valve: Secondary | ICD-10-CM | POA: Insufficient documentation

## 2016-10-24 DIAGNOSIS — R42 Dizziness and giddiness: Secondary | ICD-10-CM | POA: Insufficient documentation

## 2016-10-24 DIAGNOSIS — Z8249 Family history of ischemic heart disease and other diseases of the circulatory system: Secondary | ICD-10-CM | POA: Insufficient documentation

## 2016-10-24 DIAGNOSIS — I10 Essential (primary) hypertension: Secondary | ICD-10-CM | POA: Diagnosis not present

## 2016-10-24 DIAGNOSIS — Z87891 Personal history of nicotine dependence: Secondary | ICD-10-CM | POA: Insufficient documentation

## 2016-10-24 DIAGNOSIS — E785 Hyperlipidemia, unspecified: Secondary | ICD-10-CM | POA: Insufficient documentation

## 2016-10-24 DIAGNOSIS — F329 Major depressive disorder, single episode, unspecified: Secondary | ICD-10-CM | POA: Insufficient documentation

## 2016-10-24 DIAGNOSIS — Z90710 Acquired absence of both cervix and uterus: Secondary | ICD-10-CM | POA: Insufficient documentation

## 2016-10-24 DIAGNOSIS — D473 Essential (hemorrhagic) thrombocythemia: Secondary | ICD-10-CM | POA: Diagnosis not present

## 2016-10-24 DIAGNOSIS — Z9889 Other specified postprocedural states: Secondary | ICD-10-CM | POA: Diagnosis not present

## 2016-10-24 DIAGNOSIS — E079 Disorder of thyroid, unspecified: Secondary | ICD-10-CM | POA: Insufficient documentation

## 2016-10-24 DIAGNOSIS — Z79899 Other long term (current) drug therapy: Secondary | ICD-10-CM | POA: Insufficient documentation

## 2016-10-24 DIAGNOSIS — H5462 Unqualified visual loss, left eye, normal vision right eye: Secondary | ICD-10-CM | POA: Insufficient documentation

## 2016-10-24 DIAGNOSIS — R531 Weakness: Secondary | ICD-10-CM | POA: Insufficient documentation

## 2016-10-24 DIAGNOSIS — K589 Irritable bowel syndrome without diarrhea: Secondary | ICD-10-CM | POA: Insufficient documentation

## 2016-10-24 DIAGNOSIS — Z7901 Long term (current) use of anticoagulants: Secondary | ICD-10-CM | POA: Diagnosis not present

## 2016-10-24 DIAGNOSIS — K625 Hemorrhage of anus and rectum: Secondary | ICD-10-CM | POA: Diagnosis not present

## 2016-10-24 LAB — CBC
HCT: 44.9 % (ref 36.0–46.0)
Hemoglobin: 15.2 g/dL — ABNORMAL HIGH (ref 12.0–15.0)
MCH: 30.2 pg (ref 26.0–34.0)
MCHC: 33.9 g/dL (ref 30.0–36.0)
MCV: 89.1 fL (ref 78.0–100.0)
Platelets: 579 10*3/uL — ABNORMAL HIGH (ref 150–400)
RBC: 5.04 MIL/uL (ref 3.87–5.11)
RDW: 14 % (ref 11.5–15.5)
WBC: 10.2 10*3/uL (ref 4.0–10.5)

## 2016-10-24 LAB — FERRITIN: FERRITIN: 62 ng/mL (ref 11–307)

## 2016-10-24 NOTE — Patient Instructions (Addendum)
[place] at [facility] Discharge Instructions  RECOMMENDATIONS MADE BY THE CONSULTANT AND ANY TEST RESULTS WILL BE SENT TO YOUR REFERRING PHYSICIAN.  You were seen today by Dr. Oliva Bustard. Return in 2 weeks for follow up and results.    Thank you for choosing [place] at [facility] to provide your oncology and hematology care.  To afford each patient quality time with our provider, please arrive at least 15 minutes before your scheduled appointment time.    If you have a lab appointment with the [facility] please come in thru the  Main Entrance and check in at the main information desk  You need to re-schedule your appointment should you arrive 10 or more minutes late.  We strive to give you quality time with our providers, and arriving late affects you and other patients whose appointments are after yours.  Also, if you no show three or more times for appointments you may be dismissed from the clinic at the providers discretion.     Again, thank you for choosing [facility].  Our hope is that these requests will decrease the amount of time that you wait before being seen by our physicians.       _____________________________________________________________  Should you have questions after your visit to [facility], please contact our office at [phone] between the hours of 8:30 a.m. and 4:30 p.m.  Voicemails left after 4:30 p.m. will not be returned until the following business day.  For prescription refill requests, have your pharmacy contact our office.       Resources For Cancer Patients and their Caregivers ? American Cancer Society: Can assist with transportation, wigs, general needs, runs Look Good Feel Better.        725-846-2476 ? Cancer Care: Provides financial assistance, online support groups, medication/co-pay assistance.  1-800-813-HOPE 731-747-6491) ? [facility] Assists [place] cancer patients and their families through emotional , educational and financial support.  (707) 198-9915 ? [place] DSS Where to apply for food stamps, Medicaid and utility assistance. 819-370-1125 ? [facility]: Transportation to medical appointments. 704-153-4677 ? Social Security Administration: May apply for disability if have a Stage IV cancer. 612-022-5197 787-814-9664 ? [employer], Disability and Transit Services: Assists with nutrition, care and transit needs. [facility] Support Programs: @10RELATIVEDAYS @ > Cancer Support Group  2nd Tuesday of the month 1pm-2pm, Journey Room  > Creative Journey  3rd Tuesday of the month 1130am-1pm, Journey Room  > Look Good Feel Better  1st Wednesday of the month 10am-12 noon, Journey Room (Call [place] to register [phone])

## 2016-10-24 NOTE — Addendum Note (Signed)
Addended by: Farley Ly on: 10/24/2016 01:55 PM   Modules accepted: Orders
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
## 2016-10-27 LAB — JAK2 GENOTYPR

## 2016-10-30 LAB — BCR-ABL1, CML/ALL, PCR, QUANT

## 2016-11-03 DIAGNOSIS — Z7901 Long term (current) use of anticoagulants: Secondary | ICD-10-CM | POA: Diagnosis not present

## 2016-11-03 DIAGNOSIS — I482 Chronic atrial fibrillation: Secondary | ICD-10-CM | POA: Diagnosis not present

## 2016-11-03 DIAGNOSIS — M545 Low back pain: Secondary | ICD-10-CM | POA: Diagnosis not present

## 2016-11-03 DIAGNOSIS — R61 Generalized hyperhidrosis: Secondary | ICD-10-CM | POA: Diagnosis not present

## 2016-11-03 DIAGNOSIS — D696 Thrombocytopenia, unspecified: Secondary | ICD-10-CM | POA: Diagnosis not present

## 2016-11-03 DIAGNOSIS — H9319 Tinnitus, unspecified ear: Secondary | ICD-10-CM | POA: Diagnosis not present

## 2016-11-03 DIAGNOSIS — M25551 Pain in right hip: Secondary | ICD-10-CM | POA: Diagnosis not present

## 2016-11-03 DIAGNOSIS — R5383 Other fatigue: Secondary | ICD-10-CM | POA: Diagnosis not present

## 2016-11-08 ENCOUNTER — Encounter (HOSPITAL_BASED_OUTPATIENT_CLINIC_OR_DEPARTMENT_OTHER): Payer: Medicare Other | Admitting: Adult Health

## 2016-11-08 ENCOUNTER — Encounter (HOSPITAL_COMMUNITY): Payer: Medicare Other

## 2016-11-08 VITALS — BP 161/66 | HR 81 | Temp 98.8°F | Resp 20 | Wt 152.1 lb

## 2016-11-08 DIAGNOSIS — Z7901 Long term (current) use of anticoagulants: Secondary | ICD-10-CM | POA: Diagnosis not present

## 2016-11-08 DIAGNOSIS — R5383 Other fatigue: Secondary | ICD-10-CM

## 2016-11-08 DIAGNOSIS — R5381 Other malaise: Secondary | ICD-10-CM | POA: Diagnosis not present

## 2016-11-08 DIAGNOSIS — K625 Hemorrhage of anus and rectum: Secondary | ICD-10-CM | POA: Diagnosis not present

## 2016-11-08 DIAGNOSIS — F39 Unspecified mood [affective] disorder: Secondary | ICD-10-CM

## 2016-11-08 DIAGNOSIS — R531 Weakness: Secondary | ICD-10-CM | POA: Diagnosis not present

## 2016-11-08 DIAGNOSIS — N951 Menopausal and female climacteric states: Secondary | ICD-10-CM | POA: Diagnosis not present

## 2016-11-08 DIAGNOSIS — D473 Essential (hemorrhagic) thrombocythemia: Secondary | ICD-10-CM

## 2016-11-08 DIAGNOSIS — D75839 Thrombocytosis, unspecified: Secondary | ICD-10-CM

## 2016-11-08 DIAGNOSIS — H5462 Unqualified visual loss, left eye, normal vision right eye: Secondary | ICD-10-CM | POA: Diagnosis not present

## 2016-11-08 DIAGNOSIS — R42 Dizziness and giddiness: Secondary | ICD-10-CM | POA: Diagnosis not present

## 2016-11-08 DIAGNOSIS — M791 Myalgia: Secondary | ICD-10-CM | POA: Diagnosis not present

## 2016-11-08 LAB — COMPREHENSIVE METABOLIC PANEL
ALBUMIN: 4.4 g/dL (ref 3.5–5.0)
ALT: 18 U/L (ref 14–54)
ANION GAP: 7 (ref 5–15)
AST: 24 U/L (ref 15–41)
Alkaline Phosphatase: 77 U/L (ref 38–126)
BILIRUBIN TOTAL: 0.6 mg/dL (ref 0.3–1.2)
BUN: 21 mg/dL — AB (ref 6–20)
CHLORIDE: 101 mmol/L (ref 101–111)
CO2: 29 mmol/L (ref 22–32)
Calcium: 9.5 mg/dL (ref 8.9–10.3)
Creatinine, Ser: 0.7 mg/dL (ref 0.44–1.00)
GFR calc Af Amer: >60 mL/min (ref >60)
GLUCOSE: 104 mg/dL — AB (ref 65–99)
POTASSIUM: 4.1 mmol/L (ref 3.5–5.1)
Sodium: 137 mmol/L (ref 135–145)
TOTAL PROTEIN: 7.8 g/dL (ref 6.5–8.1)

## 2016-11-08 LAB — CBC WITH DIFFERENTIAL/PLATELET
BASOS ABS: 0.1 10*3/uL (ref 0.0–0.1)
BASOS PCT: 1 %
EOS PCT: 4 %
Eosinophils Absolute: 0.4 10*3/uL (ref 0.0–0.7)
HCT: 45.1 % (ref 36.0–46.0)
Hemoglobin: 15.1 g/dL — ABNORMAL HIGH (ref 12.0–15.0)
Lymphocytes Relative: 22 %
Lymphs Abs: 2.2 10*3/uL (ref 0.7–4.0)
MCH: 30.1 pg (ref 26.0–34.0)
MCHC: 33.5 g/dL (ref 30.0–36.0)
MCV: 89.8 fL (ref 78.0–100.0)
MONOS PCT: 13 %
Monocytes Absolute: 1.2 10*3/uL — ABNORMAL HIGH (ref 0.1–1.0)
Neutro Abs: 5.9 10*3/uL (ref 1.7–7.7)
Neutrophils Relative %: 60 %
PLATELETS: 541 10*3/uL — AB (ref 150–400)
RBC: 5.02 MIL/uL (ref 3.87–5.11)
RDW: 14.3 % (ref 11.5–15.5)
WBC: 9.7 10*3/uL (ref 4.0–10.5)

## 2016-11-08 LAB — FOLATE: FOLATE: 26.5 ng/mL (ref >5.9)

## 2016-11-08 LAB — IRON AND TIBC
Iron: 46 ug/dL (ref 28–170)
SATURATION RATIOS: 13 % (ref 10.4–31.8)
TIBC: 367 ug/dL (ref 250–450)
UIBC: 321 ug/dL

## 2016-11-08 LAB — FERRITIN: FERRITIN: 38 ng/mL (ref 11–307)

## 2016-11-08 LAB — VITAMIN B12: Vitamin B-12: 2800 pg/mL — ABNORMAL HIGH (ref 180–914)

## 2016-11-08 NOTE — Patient Instructions (Addendum)
[place] at [facility] Discharge Instructions  RECOMMENDATIONS MADE BY THE CONSULTANT AND ANY TEST RESULTS WILL BE SENT TO YOUR REFERRING PHYSICIAN.  You saw Mike Craze, NP, today Follow up in 3 months with labs to see Dr, Talbert Cage. See Amy at checkout for appointments.   Thank you for choosing [place] at [facility] to provide your oncology and hematology care.  To afford each patient quality time with our provider, please arrive at least 15 minutes before your scheduled appointment time.    If you have a lab appointment with the [facility] please come in thru the  Main Entrance and check in at the main information desk  You need to re-schedule your appointment should you arrive 10 or more minutes late.  We strive to give you quality time with our providers, and arriving late affects you and other patients whose appointments are after yours.  Also, if you no show three or more times for appointments you may be dismissed from the clinic at the providers discretion.     Again, thank you for choosing [facility].  Our hope is that these requests will decrease the amount of time that you wait before being seen by our physicians.       _____________________________________________________________  Should you have questions after your visit to [facility], please contact our office at [phone] between the hours of 8:30 a.m. and 4:30 p.m.  Voicemails left after 4:30 p.m. will not be returned until the following business day.  For prescription refill requests, have your pharmacy contact our office.       Resources For Cancer Patients and their Caregivers ? American Cancer Society: Can assist with transportation, wigs, general needs, runs Look Good Feel Better.        346-318-7110 ? Cancer Care: Provides financial assistance, online support groups, medication/co-pay assistance.  1-800-813-HOPE  931-260-2196) ? [place] Assists [place] cancer patients and their families through emotional , educational and financial support.  505-311-7885 ? [place] DSS Where to apply for food stamps, Medicaid and utility assistance. 939-832-8189 ? [facility]: Transportation to medical appointments. (307)152-1429 ? Social Security Administration: May apply for disability if have a Stage IV cancer. 9340071628 616 100 8593 ? [employer], Disability and Transit Services: Assists with nutrition, care and transit needs. [place] Support Programs: @10RELATIVEDAYS @ > Cancer Support Group  2nd Tuesday of the month 1pm-2pm, Journey Room  > Creative Journey  3rd Tuesday of the month 1130am-1pm, Journey Room  > Look Good Feel Better  1st Wednesday of the month 10am-12 noon, Journey Room (Call [place] to register [phone])
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
## 2016-11-10 ENCOUNTER — Other Ambulatory Visit (HOSPITAL_COMMUNITY): Payer: Self-pay | Admitting: Adult Health

## 2016-11-10 DIAGNOSIS — E611 Iron deficiency: Secondary | ICD-10-CM

## 2016-11-14 ENCOUNTER — Other Ambulatory Visit (HOSPITAL_COMMUNITY): Payer: Self-pay | Admitting: Adult Health

## 2016-11-14 ENCOUNTER — Other Ambulatory Visit (HOSPITAL_COMMUNITY): Payer: Self-pay

## 2016-11-14 DIAGNOSIS — E611 Iron deficiency: Secondary | ICD-10-CM

## 2016-11-14 DIAGNOSIS — D75839 Thrombocytosis, unspecified: Secondary | ICD-10-CM

## 2016-11-14 DIAGNOSIS — D473 Essential (hemorrhagic) thrombocythemia: Secondary | ICD-10-CM

## 2016-11-17 ENCOUNTER — Telehealth (HOSPITAL_COMMUNITY): Payer: Self-pay

## 2016-11-17 NOTE — Telephone Encounter (Signed)
Patient called wanting to know the exact result and the normal range for her Iron studies and ferritin. Called patient back and left message letting her know the results and also that her ferritin was less than 100. Also explained there is a calculation the providers use to determine if iron is needed and how much. Patient also requested copy of her labs. Mailed them to the temporary address listed in chart.

## 2016-11-20 ENCOUNTER — Encounter (HOSPITAL_COMMUNITY): Payer: Medicare Other | Attending: Oncology

## 2016-11-20 ENCOUNTER — Encounter (HOSPITAL_COMMUNITY): Payer: Self-pay

## 2016-11-20 ENCOUNTER — Encounter (HOSPITAL_COMMUNITY): Payer: Medicare Other

## 2016-11-20 VITALS — BP 138/58 | HR 79 | Temp 98.0°F | Resp 18

## 2016-11-20 DIAGNOSIS — Z952 Presence of prosthetic heart valve: Secondary | ICD-10-CM | POA: Diagnosis not present

## 2016-11-20 DIAGNOSIS — R42 Dizziness and giddiness: Secondary | ICD-10-CM | POA: Diagnosis not present

## 2016-11-20 DIAGNOSIS — E611 Iron deficiency: Secondary | ICD-10-CM

## 2016-11-20 DIAGNOSIS — D473 Essential (hemorrhagic) thrombocythemia: Secondary | ICD-10-CM | POA: Insufficient documentation

## 2016-11-20 DIAGNOSIS — E079 Disorder of thyroid, unspecified: Secondary | ICD-10-CM | POA: Insufficient documentation

## 2016-11-20 DIAGNOSIS — I10 Essential (primary) hypertension: Secondary | ICD-10-CM | POA: Insufficient documentation

## 2016-11-20 DIAGNOSIS — E785 Hyperlipidemia, unspecified: Secondary | ICD-10-CM | POA: Diagnosis not present

## 2016-11-20 DIAGNOSIS — Z79899 Other long term (current) drug therapy: Secondary | ICD-10-CM | POA: Insufficient documentation

## 2016-11-20 DIAGNOSIS — K589 Irritable bowel syndrome without diarrhea: Secondary | ICD-10-CM | POA: Diagnosis not present

## 2016-11-20 DIAGNOSIS — H5462 Unqualified visual loss, left eye, normal vision right eye: Secondary | ICD-10-CM | POA: Diagnosis not present

## 2016-11-20 DIAGNOSIS — F329 Major depressive disorder, single episode, unspecified: Secondary | ICD-10-CM | POA: Diagnosis not present

## 2016-11-20 DIAGNOSIS — Z90710 Acquired absence of both cervix and uterus: Secondary | ICD-10-CM | POA: Diagnosis not present

## 2016-11-20 DIAGNOSIS — R531 Weakness: Secondary | ICD-10-CM | POA: Insufficient documentation

## 2016-11-20 DIAGNOSIS — Z9889 Other specified postprocedural states: Secondary | ICD-10-CM | POA: Diagnosis not present

## 2016-11-20 DIAGNOSIS — K625 Hemorrhage of anus and rectum: Secondary | ICD-10-CM | POA: Insufficient documentation

## 2016-11-20 DIAGNOSIS — Z87891 Personal history of nicotine dependence: Secondary | ICD-10-CM | POA: Insufficient documentation

## 2016-11-20 DIAGNOSIS — Z7901 Long term (current) use of anticoagulants: Secondary | ICD-10-CM | POA: Insufficient documentation

## 2016-11-20 DIAGNOSIS — Z8249 Family history of ischemic heart disease and other diseases of the circulatory system: Secondary | ICD-10-CM | POA: Insufficient documentation

## 2016-11-20 DIAGNOSIS — D75839 Thrombocytosis, unspecified: Secondary | ICD-10-CM

## 2016-11-20 MED ORDER — SODIUM CHLORIDE 0.9% FLUSH: 3.0000 mL | Freq: Once | INTRAVENOUS | Status: DC | PRN

## 2016-11-20 MED ORDER — SODIUM CHLORIDE 0.9 % IV SOLN
Freq: Once | INTRAVENOUS | Status: AC
  Administered 2016-11-20: 14:00:00 via INTRAVENOUS

## 2016-11-20 MED ORDER — SODIUM CHLORIDE 0.9 % IV SOLN
510.0000 mg | Freq: Once | INTRAVENOUS | Status: AC
  Administered 2016-11-20: 510 mg via INTRAVENOUS
  Filled 2016-11-20: qty 17

## 2016-11-20 NOTE — Patient Instructions (Signed)
[facility] at [facility] Discharge Instructions  RECOMMENDATIONS MADE BY THE CONSULTANT AND ANY TEST RESULTS WILL BE SENT TO YOUR REFERRING PHYSICIAN.  feraheme given today Follow up as scheduled  Thank you for choosing [facility] at [facility] to provide your oncology and hematology care.  To afford each patient quality time with our provider, please arrive at least 15 minutes before your scheduled appointment time.    If you have a lab appointment with the Cancer Center please come in thru the  Main Entrance and check in at the main information desk  You need to re-schedule your appointment should you arrive 10 or more minutes late.  We strive to give you quality time with our providers, and arriving late affects you and other patients whose appointments are after yours.  Also, if you no show three or more times for appointments you may be dismissed from the clinic at the providers discretion.     Again, thank you for choosing [facility].  Our hope is that these requests will decrease the amount of time that you wait before being seen by our physicians.       _____________________________________________________________  Should you have questions after your visit to [facility], please contact our office at [phone] between the hours of 8:30 a.m. and 4:30 p.m.  Voicemails left after 4:30 p.m. will not be returned until the following business day.  For prescription refill requests, have your pharmacy contact our office.       Resources For Cancer Patients and their Caregivers ? American Cancer Society: Can assist with transportation, wigs, general needs, runs Look Good Feel Better.        1-888-227-6333 ? Cancer Care: Provides financial assistance, online support groups, medication/co-pay assistance.  1-800-813-HOPE (4673) ? [facility] Assists [place] cancer patients and  their families through emotional , educational and financial support.  336-427-4357 ? [place] DSS Where to apply for food stamps, Medicaid and utility assistance. 336-342-1394 ? [facility]: Transportation to medical appointments. 336-347-2287 ? Social Security Administration: May apply for disability if have a Stage IV cancer. 336-342-7796 1-800-772-1213 ? [place] Aging, Disability and Transit Services: Assists with nutrition, care and transit needs. 336-349-2343  Cancer Center Support Programs: @10RELATIVEDAYS@ > Cancer Support Group  2nd Tuesday of the month 1pm-2pm, Journey Room  > Creative Journey  3rd Tuesday of the month 1130am-1pm, Journey Room  > Look Good Feel Better  1st Wednesday of the month 10am-12 noon, Journey Room (Call American Cancer Society to register 1-800-395-5775)   

## 2016-11-20 NOTE — Progress Notes (Signed)
feraheme given today per orders. Patient tolerated it well without issues. Vitals stable and discharged home from clinic ambulatory. Follow up as scheduled.

## 2016-11-30 ENCOUNTER — Encounter: Payer: Self-pay | Admitting: Gastroenterology

## 2016-11-30 ENCOUNTER — Ambulatory Visit (INDEPENDENT_AMBULATORY_CARE_PROVIDER_SITE_OTHER): Payer: Medicare Other | Admitting: Gastroenterology

## 2016-11-30 ENCOUNTER — Telehealth: Payer: Self-pay | Admitting: Gastroenterology

## 2016-11-30 DIAGNOSIS — Z1211 Encounter for screening for malignant neoplasm of colon: Secondary | ICD-10-CM | POA: Diagnosis not present

## 2016-11-30 DIAGNOSIS — Z7901 Long term (current) use of anticoagulants: Secondary | ICD-10-CM

## 2016-11-30 NOTE — Assessment & Plan Note (Signed)
71 year old female presenting for first ever screening colonoscopy. Brought into the office due to chronic anticoagulation (Coumadin) for history of mechanical aortic valve. We'll plan on scheduling a colonoscopy in the near future with Dr. Gala Romney.  I have discussed the risks, alternatives, benefits with regards to but not limited to the risk of reaction to medication, bleeding, infection, perforation and the patient is agreeable to proceed. Written consent to be obtained.  Before scheduling, we will wait to see what Dr. branch recommends regarding her anticoagulation. She is also against blood transfusions due to religious beliefs.

## 2016-11-30 NOTE — Telephone Encounter (Signed)
Patient presents for screening colonoscopy, first ever. She is on coumadin with history mechanical aortic valve. Her INR is managed by homeopathic provider according to the patient. She is also against blood transfusion due to her religious beliefs.    Is Lovenox bridging appropriate for her or will she require heparin bridging?
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
## 2016-11-30 NOTE — Progress Notes (Signed)
CC'ED TO PCP 

## 2016-11-30 NOTE — Patient Instructions (Signed)
1. We will call to get she is scheduled for colonoscopy once Dr. Harl Bowie once is no how to manage your coumadin.

## 2016-12-04 NOTE — Telephone Encounter (Signed)
For this patient, based on the 2017 ACC/AHA guidelines for prosthetic valves, in the setting of a mechanical aortic valve with out other embolic risk factors bridging anticoagulation is not recommended. I do not recommend bridging for her, she should however be on an aspirin 81mg  daily and this should be continued during the period off coumadin.   Carlyle Dolly MD

## 2016-12-04 NOTE — Telephone Encounter (Signed)
Please schedule screening TCS with [facility]. Hold coumadin four days before procedure. Per Dr. Harl Bowie, she should be on ASA 81mg  daily and should continue during the period off coumadin.   PLEASE MAKE SURE SHE IS ON ASA 81MG  DAILY AS OUTLINED ABOVE ESPECIALLY WHILE OFF COUMADIN.

## 2016-12-05 NOTE — Telephone Encounter (Signed)
LMOM to call back

## 2016-12-05 NOTE — Telephone Encounter (Signed)
Please schedule

## 2016-12-05 NOTE — Telephone Encounter (Signed)
Pt is set up for TCS on 01/02/17 at 1:30 pm. She is wanting to know if she can take her protein for the two days that she on the clear liquids. She mixes it with water and it has way in it. Please advise

## 2016-12-07 NOTE — Telephone Encounter (Signed)
Do not add protein/whey to clear liquid diet.  Please see below and document to clarify. IS SHE ON ASA 81MG  DAILY? IF NOT SHE NEEDS TO TAKE WHILE OFF COUMADIN PER DR. BRANCH.

## 2016-12-08 NOTE — Telephone Encounter (Signed)
Tried to call with no answer  

## 2016-12-11 ENCOUNTER — Other Ambulatory Visit: Payer: Self-pay

## 2016-12-11 DIAGNOSIS — Z1211 Encounter for screening for malignant neoplasm of colon: Secondary | ICD-10-CM

## 2016-12-11 LAB — CALR + JAK2 E12-15 + MPL (REFLEXED)

## 2016-12-11 LAB — JAK2 V617F, W REFLEX TO CALR/E12/MPL

## 2016-12-11 MED ORDER — PEG 3350-KCL-NA BICARB-NACL 420 G PO SOLR: 4000.0000 mL | ORAL | 0 refills | Status: DC

## 2016-12-11 NOTE — Telephone Encounter (Signed)
PT called and I gave her the information. She would like a call from [facility].

## 2016-12-11 NOTE — Telephone Encounter (Signed)
Tried to call with no answer. LMOM to call back

## 2016-12-11 NOTE — Telephone Encounter (Signed)
Pt is aware of everything 

## 2016-12-11 NOTE — Telephone Encounter (Deleted)
How long does her Coumadin need to be held?

## 2016-12-19 ENCOUNTER — Telehealth: Payer: Self-pay | Admitting: *Deleted
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
## 2016-12-20 ENCOUNTER — Ambulatory Visit: Payer: Medicare Other | Admitting: Diagnostic Neuroimaging

## 2016-12-25 ENCOUNTER — Ambulatory Visit: Payer: Medicare Other | Admitting: Infectious Diseases

## 2017-01-01 ENCOUNTER — Telehealth: Payer: Self-pay | Admitting: Internal Medicine

## 2017-01-01 NOTE — Telephone Encounter (Signed)
Routing to LSL for advice. 

## 2017-01-01 NOTE — Telephone Encounter (Signed)
Tried to call pt, no answer, LMOVM and informed her. 

## 2017-01-01 NOTE — Telephone Encounter (Signed)
I told her I would contact her cardiologist regarding coumadin and if lovenox bridge was necessary but it was not. See 11/30/16 telephone note.   Based on current guidelines, she does not require antibiotics prior to colonoscopy even in setting of heart valve replacement. Other procedures still require antibiotics and especially dental work BUT NOT for a colonoscopy.

## 2017-01-01 NOTE — Telephone Encounter (Addendum)
Pt called office, she received message that she doesn't need antibiotic before colonoscopy. She is wondering if she has Lyme's disease. If any tissue from her colonoscopy has to be sent to the lab she would like it tested for Lyme's disease. She wants Dr. Gala Romney to be informed. Routing to Dr. Gala Romney.

## 2017-01-01 NOTE — Telephone Encounter (Signed)
No way to check any colontissue Tissue for Lyme disease

## 2017-01-01 NOTE — Telephone Encounter (Signed)
Routing to [facility] 

## 2017-01-01 NOTE — Telephone Encounter (Signed)
PATIENT CALLED INQUIRING ABOUT HER HAVING AN ANTIBIOTIC BEFORE HER PROCEDURE [phone], HAS TO TAKE ONE CERTAIN PROCEDURES BECAUSE SHE HAS A VALVE.  HER CARDIOLOGIST WAS SUPPOSED TO BE CONTACTED ABOUT THIS AND WE WERE TO LET HER KNOW.

## 2017-01-02 ENCOUNTER — Encounter (HOSPITAL_COMMUNITY): Payer: Self-pay | Admitting: *Deleted

## 2017-01-02 ENCOUNTER — Encounter (HOSPITAL_COMMUNITY)
Admission: RE | Disposition: A | Discharge status: Home / Self Care | Payer: Self-pay | Source: Ambulatory Visit | Attending: Internal Medicine

## 2017-01-02 ENCOUNTER — Ambulatory Visit (HOSPITAL_COMMUNITY)
Admission: RE | Admit: 2017-01-02 | Discharge: 2017-01-02 | Disposition: A | Discharge status: Home / Self Care | Payer: Medicare Other | Source: Ambulatory Visit | Attending: Internal Medicine | Admitting: Internal Medicine

## 2017-01-02 DIAGNOSIS — Z1211 Encounter for screening for malignant neoplasm of colon: Secondary | ICD-10-CM | POA: Diagnosis not present

## 2017-01-02 DIAGNOSIS — K589 Irritable bowel syndrome without diarrhea: Secondary | ICD-10-CM | POA: Diagnosis not present

## 2017-01-02 DIAGNOSIS — Z806 Family history of leukemia: Secondary | ICD-10-CM | POA: Insufficient documentation

## 2017-01-02 DIAGNOSIS — Z7901 Long term (current) use of anticoagulants: Secondary | ICD-10-CM | POA: Diagnosis not present

## 2017-01-02 DIAGNOSIS — R5382 Chronic fatigue, unspecified: Secondary | ICD-10-CM | POA: Insufficient documentation

## 2017-01-02 DIAGNOSIS — D473 Essential (hemorrhagic) thrombocythemia: Secondary | ICD-10-CM | POA: Diagnosis not present

## 2017-01-02 DIAGNOSIS — Z8249 Family history of ischemic heart disease and other diseases of the circulatory system: Secondary | ICD-10-CM | POA: Insufficient documentation

## 2017-01-02 DIAGNOSIS — Z952 Presence of prosthetic heart valve: Secondary | ICD-10-CM | POA: Diagnosis not present

## 2017-01-02 DIAGNOSIS — Z79899 Other long term (current) drug therapy: Secondary | ICD-10-CM | POA: Diagnosis not present

## 2017-01-02 DIAGNOSIS — Z87891 Personal history of nicotine dependence: Secondary | ICD-10-CM | POA: Insufficient documentation

## 2017-01-02 DIAGNOSIS — Z7982 Long term (current) use of aspirin: Secondary | ICD-10-CM | POA: Diagnosis not present

## 2017-01-02 DIAGNOSIS — N289 Disorder of kidney and ureter, unspecified: Secondary | ICD-10-CM | POA: Insufficient documentation

## 2017-01-02 DIAGNOSIS — F329 Major depressive disorder, single episode, unspecified: Secondary | ICD-10-CM | POA: Insufficient documentation

## 2017-01-02 DIAGNOSIS — K5730 Diverticulosis of large intestine without perforation or abscess without bleeding: Secondary | ICD-10-CM | POA: Diagnosis not present

## 2017-01-02 DIAGNOSIS — E785 Hyperlipidemia, unspecified: Secondary | ICD-10-CM | POA: Insufficient documentation

## 2017-01-02 DIAGNOSIS — D120 Benign neoplasm of cecum: Secondary | ICD-10-CM | POA: Diagnosis not present

## 2017-01-02 DIAGNOSIS — M1990 Unspecified osteoarthritis, unspecified site: Secondary | ICD-10-CM | POA: Diagnosis not present

## 2017-01-02 DIAGNOSIS — Z1212 Encounter for screening for malignant neoplasm of rectum: Secondary | ICD-10-CM | POA: Diagnosis not present

## 2017-01-02 DIAGNOSIS — R569 Unspecified convulsions: Secondary | ICD-10-CM | POA: Insufficient documentation

## 2017-01-02 DIAGNOSIS — E079 Disorder of thyroid, unspecified: Secondary | ICD-10-CM | POA: Diagnosis not present

## 2017-01-02 DIAGNOSIS — I10 Essential (primary) hypertension: Secondary | ICD-10-CM | POA: Diagnosis not present

## 2017-01-02 HISTORY — PX: COLONOSCOPY: SHX5424

## 2017-01-02 HISTORY — PX: POLYPECTOMY: SHX5525

## 2017-01-02 SURGERY — COLONOSCOPY
Anesthesia: Moderate Sedation

## 2017-01-02 MED ORDER — SODIUM CHLORIDE 0.9 % IV SOLN
INTRAVENOUS | Status: DC
Start: 2017-01-02 — End: 2017-01-02
  Administered 2017-01-02: 13:00:00 via INTRAVENOUS

## 2017-01-02 MED ORDER — STERILE WATER FOR IRRIGATION IR SOLN
Status: DC | PRN
  Administered 2017-01-02: 13:00:00

## 2017-01-02 MED ORDER — MEPERIDINE HCL 100 MG/ML IJ SOLN
INTRAMUSCULAR | Status: AC
  Filled 2017-01-02: qty 2

## 2017-01-02 MED ORDER — ONDANSETRON HCL 4 MG/2ML IJ SOLN
INTRAMUSCULAR | Status: DC | PRN
  Administered 2017-01-02: 4 mg via INTRAVENOUS

## 2017-01-02 MED ORDER — ONDANSETRON HCL 4 MG/2ML IJ SOLN
INTRAMUSCULAR | Status: AC
  Filled 2017-01-02: qty 2

## 2017-01-02 MED ORDER — MEPERIDINE HCL 100 MG/ML IJ SOLN
INTRAMUSCULAR | Status: DC | PRN
  Administered 2017-01-02: 50 mg via INTRAVENOUS
  Administered 2017-01-02: 25 mg via INTRAVENOUS

## 2017-01-02 MED ORDER — MIDAZOLAM HCL 5 MG/5ML IJ SOLN
INTRAMUSCULAR | Status: AC
  Filled 2017-01-02: qty 10

## 2017-01-02 MED ORDER — MIDAZOLAM HCL 5 MG/5ML IJ SOLN
INTRAMUSCULAR | Status: DC | PRN
  Administered 2017-01-02: 1 mg via INTRAVENOUS
  Administered 2017-01-02: 2 mg via INTRAVENOUS
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
## 2017-01-02 NOTE — Discharge Instructions (Signed)
Colon Polyps Polyps are tissue growths inside the body. Polyps can grow in many places, including the large intestine (colon). A polyp may be a round bump or a mushroom-shaped growth. You could have one polyp or several. Most colon polyps are noncancerous (benign). However, some colon polyps can become cancerous over time. What are the causes? The exact cause of colon polyps is not known. What increases the risk? This condition is more likely to develop in people who:  Have a family history of colon cancer or colon polyps.  Are older than 57 or older than 45 if they are African American.  Have inflammatory bowel disease, such as ulcerative colitis or Crohn disease.  Are overweight.  Smoke cigarettes.  Do not get enough exercise.  Drink too much alcohol.  Eat a diet that is: ? High in fat and red meat. ? Low in fiber.  Had childhood cancer that was treated with abdominal radiation.  What are the signs or symptoms? Most polyps do not cause symptoms. If you have symptoms, they may include:  Blood coming from your rectum when having a bowel movement.  Blood in your stool.The stool may look dark red or black.  A change in bowel habits, such as constipation or diarrhea.  How is this diagnosed? This condition is diagnosed with a colonoscopy. This is a procedure that uses a lighted, flexible scope to look at the inside of your colon. How is this treated? Treatment for this condition involves removing any polyps that are found. Those polyps will then be tested for cancer. If cancer is found, your health care provider will talk to you about options for colon cancer treatment. Follow these instructions at home: Diet  Eat plenty of fiber, such as fruits, vegetables, and whole grains.  Eat foods that are high in calcium and vitamin D, such as milk, cheese, yogurt, eggs, liver, fish, and broccoli.  Limit foods high in fat, red meats, and processed meats, such as hot dogs, sausage,  bacon, and lunch meats.  Maintain a healthy weight, or lose weight if recommended by your health care provider. General instructions  Do not smoke cigarettes.  Do not drink alcohol excessively.  Keep all follow-up visits as told by your health care provider. This is important. This includes keeping regularly scheduled colonoscopies. Talk to your health care provider about when you need a colonoscopy.  Exercise every day or as told by your health care provider. Contact a health care provider if:  You have new or worsening bleeding during a bowel movement.  You have new or increased blood in your stool.  You have a change in bowel habits.  You unexpectedly lose weight. This information is not intended to replace advice given to you by your health care provider. Make sure you discuss any questions you have with your health care provider. Document Released: 01/26/2004 Document Revised: 10/07/2015 Document Reviewed: 03/22/2015 Elsevier Interactive Patient Education  [employer]. Diverticulosis Diverticulosis is a condition that develops when small pouches (diverticula) form in the wall of the large intestine (colon). The colon is where water is absorbed and stool is formed. The pouches form when the inside layer of the colon pushes through weak spots in the outer layers of the colon. You may have a few pouches or many of them. What are the causes? The cause of this condition is not known. What increases the risk? The following factors may make you more likely to develop this condition:  Being older than age  49. Your risk for this condition increases with age. Diverticulosis is rare among people younger than age 73. By age 76, many people have it.  Eating a low-fiber diet.  Having frequent constipation.  Being overweight.  Not getting enough exercise.  Smoking.  Taking over-the-counter pain medicines, like aspirin and ibuprofen.  Having a family history of  diverticulosis.  What are the signs or symptoms? In most people, there are no symptoms of this condition. If you do have symptoms, they may include:  Bloating.  Cramps in the abdomen.  Constipation or diarrhea.  Pain in the lower left side of the abdomen.  How is this diagnosed? This condition is most often diagnosed during an exam for other colon problems. Because diverticulosis usually has no symptoms, it often cannot be diagnosed independently. This condition may be diagnosed by:  Using a flexible scope to examine the colon (colonoscopy).  Taking an X-ray of the colon after dye has been put into the colon (barium enema).  Doing a CT scan.  How is this treated? You may not need treatment for this condition if you have never developed an infection related to diverticulosis. If you have had an infection before, treatment may include:  Eating a high-fiber diet. This may include eating more fruits, vegetables, and grains.  Taking a fiber supplement.  Taking a live bacteria supplement (probiotic).  Taking medicine to relax your colon.  Taking antibiotic medicines.  Follow these instructions at home:  Drink 6-8 glasses of water or more each day to prevent constipation.  Try not to strain when you have a bowel movement.  If you have had an infection before: ? Eat more fiber as directed by your health care provider or your diet and nutrition specialist (dietitian). ? Take a fiber supplement or probiotic, if your health care provider approves.  Take over-the-counter and prescription medicines only as told by your health care provider.  If you were prescribed an antibiotic, take it as told by your health care provider. Do not stop taking the antibiotic even if you start to feel better.  Keep all follow-up visits as told by your health care provider. This is important. Contact a health care provider if:  You have pain in your abdomen.  You have bloating.  You have  cramps.  You have not had a bowel movement in 3 days. Get help right away if:  Your pain gets worse.  Your bloating becomes very bad.  You have a fever or chills, and your symptoms suddenly get worse.  You vomit.  You have bowel movements that are bloody or black.  You have bleeding from your rectum. Summary  Diverticulosis is a condition that develops when small pouches (diverticula) form in the wall of the large intestine (colon).  You may have a few pouches or many of them.  This condition is most often diagnosed during an exam for other colon problems.  If you have had an infection related to diverticulosis, treatment may include increasing the fiber in your diet, taking supplements, or taking medicines. This information is not intended to replace advice given to you by your health care provider. Make sure you discuss any questions you have with your health care provider. Document Released: 01/27/2004 Document Revised: 03/20/2016 Document Reviewed: 03/20/2016 Elsevier Interactive Patient Education  2017 [place].  Colonoscopy Discharge Instructions  Read the instructions outlined below and refer to this sheet in the next few weeks. These discharge instructions provide you with general information on caring  for yourself after you leave the hospital. Your doctor may also give you specific instructions. While your treatment has been planned according to the most current medical practices available, unavoidable complications occasionally occur. If you have any problems or questions after discharge, call Dr. Gala Romney at [phone]. ACTIVITY  You may resume your regular activity, but move at a slower pace for the next 24 hours.   Take frequent rest periods for the next 24 hours.   Walking will help get rid of the air and reduce the bloated feeling in your belly (abdomen).   No driving for 24 hours (because of the medicine (anesthesia) used during the test).    Do not sign any  important legal documents or operate any machinery for 24 hours (because of the anesthesia used during the test).  NUTRITION  Drink plenty of fluids.   You may resume your normal diet as instructed by your doctor.   Begin with a light meal and progress to your normal diet. Heavy or fried foods are harder to digest and may make you feel sick to your stomach (nauseated).   Avoid alcoholic beverages for 24 hours or as instructed.  MEDICATIONS  You may resume your normal medications unless your doctor tells you otherwise.  WHAT YOU CAN EXPECT TODAY  Some feelings of bloating in the abdomen.   Passage of more gas than usual.   Spotting of blood in your stool or on the toilet paper.  IF YOU HAD POLYPS REMOVED DURING THE COLONOSCOPY:  No aspirin products for 7 days or as instructed.   No alcohol for 7 days or as instructed.   Eat a soft diet for the next 24 hours.  FINDING OUT THE RESULTS OF YOUR TEST Not all test results are available during your visit. If your test results are not back during the visit, make an appointment with your caregiver to find out the results. Do not assume everything is normal if you have not heard from your caregiver or the medical facility. It is important for you to follow up on all of your test results.  SEEK IMMEDIATE MEDICAL ATTENTION IF:  You have more than a spotting of blood in your stool.   Your belly is swollen (abdominal distention).   You are nauseated or vomiting.   You have a temperature over 101.   You have abdominal pain or discomfort that is severe or gets worse throughout the day.    Colon polyp and diverticulosis information provided  Resume Coumadin tomorrow.  Further recommendations to follow pending review of pathology report

## 2017-01-02 NOTE — Op Note (Signed)
[facility] Patient Name: Ana [name] Procedure Date: 01/02/2017 1:00 PM MRN: [id number] Date of Birth: Dec 15, 1945 Attending MD: Norvel Richards , MD CSN: [id number] Age: 71 Admit Type: Outpatient Procedure:                Colonoscopy Indications:              Screening for colorectal malignant neoplasm Providers:                Norvel Richards, MD, Otis Peak B. Gwenlyn Perking RN, RN,                            Aram Candela Referring MD:              Medicines:                Midazolam 3 mg IV, Meperidine 75 mg IV, Ondansetron                            4 mg IV Complications:            No immediate complications. Estimated Blood Loss:     Estimated blood loss was minimal. Procedure:                Pre-Anesthesia Assessment:                           - Prior to the procedure, a History and Physical                            was performed, and patient medications and                            allergies were reviewed. The patient's tolerance of                            previous anesthesia was also reviewed. The risks                            and benefits of the procedure and the sedation                            options and risks were discussed with the patient.                            All questions were answered, and informed consent                            was obtained. Prior Anticoagulants: The patient                            last took Coumadin (warfarin) 5 days prior to the                            procedure. ASA Grade Assessment: III - A patient  with severe systemic disease. After reviewing the                            risks and benefits, the patient was deemed in                            satisfactory condition to undergo the procedure.                           After obtaining informed consent, the colonoscope                            was passed under direct vision. Throughout the                            procedure, the  patient's blood pressure, pulse, and                            oxygen saturations were monitored continuously. The                            EC-[id number] ([id number]) scope was introduced through                            the anus and advanced to the the cecum, identified                            by appendiceal orifice and ileocecal valve. The                            colonoscopy was performed without difficulty. The                            patient tolerated the procedure well. The quality                            of the bowel preparation was adequate. The                            ileocecal valve, appendiceal orifice, and rectum                            were photographed. The entire colon was well                            visualized. The quality of the bowel preparation                            was adequate. Scope In: 1:28:45 PM Scope Out: 1:45:18 PM Scope Withdrawal Time: 0 hours 10 minutes 5 seconds  Total Procedure Duration: 0 hours 16 minutes 33 seconds  Findings:      The perianal and digital rectal examinations were normal.      Multiple small and large-mouthed diverticula were found in the sigmoid  colon.      A 5 mm polyp was found in the cecum. The polyp was sessile. The polyp       was removed with a cold snare. Resection and retrieval were complete.       Estimated blood loss was minimal.      The exam was otherwise without abnormality on direct and retroflexion       views. Impression:               - Diverticulosis in the sigmoid colon.                           - One 5 mm polyp in the cecum, removed with a cold                            snare. Resected and retrieved.                           - The examination was otherwise normal on direct                            and retroflexion views. Moderate Sedation:      Moderate (conscious) sedation was administered by the endoscopy nurse       and supervised by the endoscopist. The following parameters  were       monitored: oxygen saturation, heart rate, blood pressure, respiratory       rate, EKG, adequacy of pulmonary ventilation, and response to care.       Total physician intraservice time was 21 minutes. Recommendation:           - Patient has a contact number available for                            emergencies. The signs and symptoms of potential                            delayed complications were discussed with the                            patient. Return to normal activities tomorrow.                            Written discharge instructions were provided to the                            patient.                           - Resume previous diet.                           - Continue present medications.                           - Repeat colonoscopy date to be determined after  pending pathology results are reviewed for                            surveillance based on pathology results.                           - Return to GI clinic (date not yet determined). We                            will ask pathologist to look at specimen for                            potential Lyme's disease. This is unconventional                            but patient is insistent on making this request. Procedure Code(s):        --- Professional ---                           [phone], Colonoscopy, flexible; with removal of                            tumor(s), polyp(s), or other lesion(s) by snare                            technique                           99152, Moderate sedation services provided by the                            same physician or other qualified health care                            professional performing the diagnostic or                            therapeutic service that the sedation supports,                            requiring the presence of an independent trained                            observer to assist in the monitoring of the                             patient's level of consciousness and physiological                            status; initial 15 minutes of intraservice time,                            patient age 43 years or older Diagnosis Code(s):        --- Professional ---  Z12.11, Encounter for screening for malignant                            neoplasm of colon                           D12.0, Benign neoplasm of cecum                           K57.30, Diverticulosis of large intestine without                            perforation or abscess without bleeding CPT copyright 2016 [employer] Association. All rights reserved. The codes documented in this report are preliminary and upon coder review may  be revised to meet current compliance requirements. Cristopher Estimable. Bridget , MD Norvel Richards, MD 01/02/2017 1:52:29 PM This report has been signed electronically. Number of Addenda: 0

## 2017-01-02 NOTE — Telephone Encounter (Signed)
Noted  

## 2017-01-04 ENCOUNTER — Encounter (HOSPITAL_COMMUNITY): Payer: Self-pay | Admitting: Internal Medicine

## 2017-01-04 ENCOUNTER — Encounter: Payer: Self-pay | Admitting: Internal Medicine

## 2017-01-17 DIAGNOSIS — E782 Mixed hyperlipidemia: Secondary | ICD-10-CM | POA: Diagnosis not present

## 2017-01-17 DIAGNOSIS — R5381 Other malaise: Secondary | ICD-10-CM | POA: Diagnosis not present

## 2017-01-17 DIAGNOSIS — I10 Essential (primary) hypertension: Secondary | ICD-10-CM | POA: Diagnosis not present

## 2017-01-17 DIAGNOSIS — N959 Unspecified menopausal and perimenopausal disorder: Secondary | ICD-10-CM | POA: Diagnosis not present

## 2017-01-17 DIAGNOSIS — E049 Nontoxic goiter, unspecified: Secondary | ICD-10-CM | POA: Diagnosis not present

## 2017-01-29 ENCOUNTER — Emergency Department (HOSPITAL_COMMUNITY): Payer: Medicare Other

## 2017-01-29 ENCOUNTER — Inpatient Hospital Stay (HOSPITAL_COMMUNITY)
Admission: EM | Admit: 2017-01-29 | Discharge: 2017-02-01 | DRG: 446 | Disposition: A | Discharge status: Home / Self Care | Payer: Medicare Other | Attending: Internal Medicine | Admitting: Internal Medicine

## 2017-01-29 ENCOUNTER — Encounter (HOSPITAL_COMMUNITY): Payer: Self-pay | Admitting: Emergency Medicine

## 2017-01-29 DIAGNOSIS — K810 Acute cholecystitis: Secondary | ICD-10-CM | POA: Diagnosis present

## 2017-01-29 DIAGNOSIS — E785 Hyperlipidemia, unspecified: Secondary | ICD-10-CM | POA: Diagnosis present

## 2017-01-29 DIAGNOSIS — Z970 Presence of artificial eye: Secondary | ICD-10-CM

## 2017-01-29 DIAGNOSIS — R011 Cardiac murmur, unspecified: Secondary | ICD-10-CM | POA: Diagnosis present

## 2017-01-29 DIAGNOSIS — K8080 Other cholelithiasis without obstruction: Secondary | ICD-10-CM | POA: Diagnosis not present

## 2017-01-29 DIAGNOSIS — Z86010 Personal history of colonic polyps: Secondary | ICD-10-CM | POA: Diagnosis not present

## 2017-01-29 DIAGNOSIS — Z79899 Other long term (current) drug therapy: Secondary | ICD-10-CM

## 2017-01-29 DIAGNOSIS — Z9089 Acquired absence of other organs: Secondary | ICD-10-CM | POA: Diagnosis not present

## 2017-01-29 DIAGNOSIS — Z87891 Personal history of nicotine dependence: Secondary | ICD-10-CM

## 2017-01-29 DIAGNOSIS — H5462 Unqualified visual loss, left eye, normal vision right eye: Secondary | ICD-10-CM | POA: Diagnosis present

## 2017-01-29 DIAGNOSIS — G40909 Epilepsy, unspecified, not intractable, without status epilepticus: Secondary | ICD-10-CM | POA: Diagnosis not present

## 2017-01-29 DIAGNOSIS — Z8639 Personal history of other endocrine, nutritional and metabolic disease: Secondary | ICD-10-CM | POA: Diagnosis not present

## 2017-01-29 DIAGNOSIS — Z7901 Long term (current) use of anticoagulants: Secondary | ICD-10-CM

## 2017-01-29 DIAGNOSIS — R9431 Abnormal electrocardiogram [ECG] [EKG]: Secondary | ICD-10-CM | POA: Diagnosis present

## 2017-01-29 DIAGNOSIS — Z531 Procedure and treatment not carried out because of patient's decision for reasons of belief and group pressure: Secondary | ICD-10-CM | POA: Diagnosis present

## 2017-01-29 DIAGNOSIS — K8000 Calculus of gallbladder with acute cholecystitis without obstruction: Principal | ICD-10-CM | POA: Diagnosis present

## 2017-01-29 DIAGNOSIS — K8020 Calculus of gallbladder without cholecystitis without obstruction: Secondary | ICD-10-CM | POA: Diagnosis present

## 2017-01-29 DIAGNOSIS — D473 Essential (hemorrhagic) thrombocythemia: Secondary | ICD-10-CM | POA: Diagnosis not present

## 2017-01-29 DIAGNOSIS — D751 Secondary polycythemia: Secondary | ICD-10-CM | POA: Diagnosis present

## 2017-01-29 DIAGNOSIS — Z7982 Long term (current) use of aspirin: Secondary | ICD-10-CM

## 2017-01-29 DIAGNOSIS — Z8249 Family history of ischemic heart disease and other diseases of the circulatory system: Secondary | ICD-10-CM

## 2017-01-29 DIAGNOSIS — Z806 Family history of leukemia: Secondary | ICD-10-CM

## 2017-01-29 DIAGNOSIS — I69898 Other sequelae of other cerebrovascular disease: Secondary | ICD-10-CM | POA: Diagnosis not present

## 2017-01-29 DIAGNOSIS — R791 Abnormal coagulation profile: Secondary | ICD-10-CM | POA: Diagnosis present

## 2017-01-29 DIAGNOSIS — R5382 Chronic fatigue, unspecified: Secondary | ICD-10-CM | POA: Diagnosis present

## 2017-01-29 DIAGNOSIS — R1011 Right upper quadrant pain: Secondary | ICD-10-CM | POA: Diagnosis not present

## 2017-01-29 DIAGNOSIS — D75839 Thrombocytosis, unspecified: Secondary | ICD-10-CM | POA: Diagnosis present

## 2017-01-29 DIAGNOSIS — I10 Essential (primary) hypertension: Secondary | ICD-10-CM | POA: Diagnosis present

## 2017-01-29 DIAGNOSIS — Z01810 Encounter for preprocedural cardiovascular examination: Secondary | ICD-10-CM | POA: Diagnosis not present

## 2017-01-29 DIAGNOSIS — Z9049 Acquired absence of other specified parts of digestive tract: Secondary | ICD-10-CM

## 2017-01-29 DIAGNOSIS — R1031 Right lower quadrant pain: Secondary | ICD-10-CM | POA: Diagnosis not present

## 2017-01-29 DIAGNOSIS — Z952 Presence of prosthetic heart valve: Secondary | ICD-10-CM | POA: Diagnosis not present

## 2017-01-29 HISTORY — DX: Thrombocytosis, unspecified: D75.839

## 2017-01-29 HISTORY — DX: Essential (hemorrhagic) thrombocythemia: D47.3

## 2017-01-29 HISTORY — DX: Essential (primary) hypertension: I10

## 2017-01-29 HISTORY — DX: Nonrheumatic aortic valve disorder, unspecified: I35.9

## 2017-01-29 LAB — URINALYSIS, ROUTINE W REFLEX MICROSCOPIC
Bilirubin Urine: NEGATIVE
Glucose, UA: NEGATIVE mg/dL
Hgb urine dipstick: NEGATIVE
Ketones, ur: NEGATIVE mg/dL
LEUKOCYTES UA: NEGATIVE
NITRITE: NEGATIVE
Protein, ur: NEGATIVE mg/dL
SPECIFIC GRAVITY, URINE: 1.019 (ref 1.005–1.030)
pH: 5 (ref 5.0–8.0)

## 2017-01-29 LAB — COMPREHENSIVE METABOLIC PANEL
ALT: 26 U/L (ref 14–54)
ANION GAP: 14 (ref 5–15)
AST: 27 U/L (ref 15–41)
Albumin: 4.7 g/dL (ref 3.5–5.0)
Alkaline Phosphatase: 77 U/L (ref 38–126)
BILIRUBIN TOTAL: 0.5 mg/dL (ref 0.3–1.2)
BUN: 16 mg/dL (ref 6–20)
CALCIUM: 9.4 mg/dL (ref 8.9–10.3)
CO2: 24 mmol/L (ref 22–32)
Chloride: 100 mmol/L — ABNORMAL LOW (ref 101–111)
Creatinine, Ser: 0.67 mg/dL (ref 0.44–1.00)
GFR calc non Af Amer: >60 mL/min (ref >60)
Glucose, Bld: 145 mg/dL — ABNORMAL HIGH (ref 65–99)
POTASSIUM: 3.6 mmol/L (ref 3.5–5.1)
Sodium: 138 mmol/L (ref 135–145)
TOTAL PROTEIN: 8.3 g/dL — AB (ref 6.5–8.1)

## 2017-01-29 LAB — CBC
HEMATOCRIT: 48.7 % — AB (ref 36.0–46.0)
HEMOGLOBIN: 16.5 g/dL — AB (ref 12.0–15.0)
MCH: 30.5 pg (ref 26.0–34.0)
MCHC: 33.9 g/dL (ref 30.0–36.0)
MCV: 90 fL (ref 78.0–100.0)
Platelets: 610 10*3/uL — ABNORMAL HIGH (ref 150–400)
RBC: 5.41 MIL/uL — AB (ref 3.87–5.11)
RDW: 13.8 % (ref 11.5–15.5)
WBC: 19.4 10*3/uL — AB (ref 4.0–10.5)

## 2017-01-29 LAB — DIFFERENTIAL
Basophils Absolute: 0 10*3/uL (ref 0.0–0.1)
Basophils Relative: 0 %
Eosinophils Absolute: 0 10*3/uL (ref 0.0–0.7)
Eosinophils Relative: 0 %
LYMPHS PCT: 5 %
Lymphs Abs: 1 10*3/uL (ref 0.7–4.0)
Monocytes Absolute: 0.8 10*3/uL (ref 0.1–1.0)
Monocytes Relative: 4 %
NEUTROS ABS: 17 10*3/uL — AB (ref 1.7–7.7)
NEUTROS PCT: 91 %

## 2017-01-29 LAB — PROTIME-INR
INR: 2.22
PROTHROMBIN TIME: 24.4 s — AB (ref 11.4–15.2)

## 2017-01-29 LAB — LIPASE, BLOOD: Lipase: 32 U/L (ref 11–51)

## 2017-01-29 MED ORDER — PANTOPRAZOLE SODIUM 40 MG IV SOLR
40.0000 mg | INTRAVENOUS | Status: DC
  Administered 2017-01-29 – 2017-01-31 (×3): 40 mg via INTRAVENOUS
  Filled 2017-01-29 (×3): qty 40

## 2017-01-29 MED ORDER — PIPERACILLIN-TAZOBACTAM 3.375 G IVPB
3.3750 g | Freq: Three times a day (TID) | INTRAVENOUS | Status: DC
  Administered 2017-01-30 – 2017-01-31 (×4): 3.375 g via INTRAVENOUS
  Filled 2017-01-29 (×4): qty 50

## 2017-01-29 MED ORDER — ONDANSETRON HCL 4 MG/2ML IJ SOLN
4.0000 mg | Freq: Once | INTRAMUSCULAR | Status: AC
  Administered 2017-01-29: 4 mg via INTRAVENOUS
  Filled 2017-01-29: qty 2

## 2017-01-29 MED ORDER — MORPHINE SULFATE (PF) 4 MG/ML IV SOLN
4.0000 mg | Freq: Once | INTRAVENOUS | Status: AC
  Administered 2017-01-29: 4 mg via INTRAVENOUS
  Filled 2017-01-29: qty 1

## 2017-01-29 MED ORDER — IOPAMIDOL (ISOVUE-300) INJECTION 61%
100.0000 mL | Freq: Once | INTRAVENOUS | Status: AC | PRN
  Administered 2017-01-29: 100 mL via INTRAVENOUS

## 2017-01-29 MED ORDER — ONDANSETRON HCL 4 MG/2ML IJ SOLN
4.0000 mg | Freq: Four times a day (QID) | INTRAMUSCULAR | Status: DC | PRN
Start: 2017-01-29 — End: 2017-02-01

## 2017-01-29 MED ORDER — ONDANSETRON HCL 4 MG PO TABS: 4.0000 mg | ORAL_TABLET | Freq: Four times a day (QID) | ORAL | Status: DC | PRN

## 2017-01-29 MED ORDER — POTASSIUM CHLORIDE IN NACL 20-0.9 MEQ/L-% IV SOLN
INTRAVENOUS | Status: DC
  Administered 2017-01-29 – 2017-01-31 (×3): via INTRAVENOUS

## 2017-01-29 MED ORDER — MORPHINE SULFATE (PF) 2 MG/ML IV SOLN
2.0000 mg | Freq: Once | INTRAVENOUS | Status: AC
  Administered 2017-01-29: 2 mg via INTRAVENOUS
  Filled 2017-01-29: qty 1

## 2017-01-29 MED ORDER — SODIUM CHLORIDE 0.9 % IV BOLUS (SEPSIS)
1000.0000 mL | Freq: Once | INTRAVENOUS | Status: AC
  Administered 2017-01-29: 1000 mL via INTRAVENOUS

## 2017-01-29 MED ORDER — MORPHINE SULFATE (PF) 2 MG/ML IV SOLN: 2.0000 mg | INTRAVENOUS | Status: DC | PRN

## 2017-01-29 MED ORDER — PIPERACILLIN-TAZOBACTAM 3.375 G IVPB 30 MIN
3.3750 g | INTRAVENOUS | Status: AC
  Administered 2017-01-29: 3.375 g via INTRAVENOUS
  Filled 2017-01-29: qty 50

## 2017-01-29 NOTE — Progress Notes (Signed)
Pharmacy Antibiotic Note  Ana [name] is a 71 y.o. female admitted on 01/29/2017 with intra-abdominal infection.  Pharmacy has been consulted for zosyn dosing.  Plan: Zosyn 3.375g IV q8h (4 hour infusion).  Monitor labs, progress, c/s  Height: 5\' 5"  (165.1 cm) Weight: 150 lb (68 kg) IBW/kg (Calculated) : 57  Temp (24hrs), Avg:98.3 F (36.8 C), Min:98.3 F (36.8 C), Max:98.3 F (36.8 C)   Recent Labs Lab 01/29/17 1148  WBC 19.4*  CREATININE 0.67    Estimated Creatinine Clearance: 58.9 mL/min (by C-G formula based on SCr of 0.67 mg/dL).   Allergies  Allergen Reactions  . Blood-Group Specific Substance     NO BLOOD PRODUCTS   Antimicrobials this admission: Zosyn 9/17 >>   Thank you for allowing pharmacy to be a part of this patient's care.  Hart Robinsons A 01/29/2017 5:08 PM
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
## 2017-01-29 NOTE — ED Notes (Signed)
Ana [name] at bedside
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
## 2017-01-29 NOTE — ED Triage Notes (Signed)
Patient complains of right abdominal pain and vomiting that started this morning. States chills this morning also. Denies diarrhea.

## 2017-01-30 ENCOUNTER — Inpatient Hospital Stay (HOSPITAL_COMMUNITY): Payer: Medicare Other

## 2017-01-30 DIAGNOSIS — I10 Essential (primary) hypertension: Secondary | ICD-10-CM

## 2017-01-30 DIAGNOSIS — R9431 Abnormal electrocardiogram [ECG] [EKG]: Secondary | ICD-10-CM

## 2017-01-30 LAB — CBC WITH DIFFERENTIAL/PLATELET
Basophils Absolute: 0 10*3/uL (ref 0.0–0.1)
Basophils Relative: 0 %
EOS ABS: 0.2 10*3/uL (ref 0.0–0.7)
EOS PCT: 2 %
HCT: 41 % (ref 36.0–46.0)
HEMOGLOBIN: 13.6 g/dL (ref 12.0–15.0)
LYMPHS ABS: 2 10*3/uL (ref 0.7–4.0)
Lymphocytes Relative: 19 %
MCH: 30.2 pg (ref 26.0–34.0)
MCHC: 33.2 g/dL (ref 30.0–36.0)
MCV: 90.9 fL (ref 78.0–100.0)
MONOS PCT: 14 %
Monocytes Absolute: 1.5 10*3/uL — ABNORMAL HIGH (ref 0.1–1.0)
Neutro Abs: 7 10*3/uL (ref 1.7–7.7)
Neutrophils Relative %: 65 %
Platelets: 460 10*3/uL — ABNORMAL HIGH (ref 150–400)
RBC: 4.51 MIL/uL (ref 3.87–5.11)
RDW: 14.1 % (ref 11.5–15.5)
WBC: 10.7 10*3/uL — ABNORMAL HIGH (ref 4.0–10.5)

## 2017-01-30 LAB — COMPREHENSIVE METABOLIC PANEL
ALK PHOS: 59 U/L (ref 38–126)
ALT: 35 U/L (ref 14–54)
ANION GAP: 7 (ref 5–15)
AST: 37 U/L (ref 15–41)
Albumin: 3.4 g/dL — ABNORMAL LOW (ref 3.5–5.0)
BUN: 11 mg/dL (ref 6–20)
CALCIUM: 8.5 mg/dL — AB (ref 8.9–10.3)
CO2: 27 mmol/L (ref 22–32)
Chloride: 104 mmol/L (ref 101–111)
Creatinine, Ser: 0.73 mg/dL (ref 0.44–1.00)
GFR calc non Af Amer: >60 mL/min (ref >60)
Glucose, Bld: 107 mg/dL — ABNORMAL HIGH (ref 65–99)
Potassium: 3.7 mmol/L (ref 3.5–5.1)
SODIUM: 138 mmol/L (ref 135–145)
TOTAL PROTEIN: 6.2 g/dL — AB (ref 6.5–8.1)
Total Bilirubin: 0.8 mg/dL (ref 0.3–1.2)

## 2017-01-30 LAB — PROTIME-INR
INR: 2.69
PROTHROMBIN TIME: 28.4 s — AB (ref 11.4–15.2)

## 2017-01-30 LAB — ECHOCARDIOGRAM COMPLETE
HEIGHTINCHES: 65 in
WEIGHTICAEL: 2400 [oz_av]

## 2017-01-30 LAB — APTT: aPTT: 50 seconds — ABNORMAL HIGH (ref 24–36)

## 2017-01-30 MED ORDER — AMLODIPINE BESYLATE 5 MG PO TABS
10.0000 mg | ORAL_TABLET | Freq: Every evening | ORAL | Status: DC
  Administered 2017-01-30 – 2017-01-31 (×3): 10 mg via ORAL
  Filled 2017-01-30 (×3): qty 2

## 2017-01-30 MED ORDER — LISINOPRIL 5 MG PO TABS
5.0000 mg | ORAL_TABLET | Freq: Every day | ORAL | Status: DC
  Administered 2017-01-30 – 2017-02-01 (×3): 5 mg via ORAL
  Filled 2017-01-30 (×3): qty 1

## 2017-01-30 NOTE — Progress Notes (Signed)
  Echocardiogram 2D Echocardiogram has been performed.  Ana [name] M 01/30/2017, 2:55 PM

## 2017-01-30 NOTE — Progress Notes (Signed)
Nutrition Brief Note  Patient identified on the Malnutrition Screening Tool (MST) Report. She had reported losing weight without trying  Wt Readings from Last 15 Encounters:  01/29/17 150 lb (68 kg)  01/02/17 144 lb (65.3 kg)  11/30/16 151 lb 7.2 oz (68.7 kg)  11/08/16 152 lb 1.6 oz (69 kg)  10/24/16 150 lb (68 kg)  04/14/15 164 lb (74.4 kg)  07/16/14 165 lb (74.8 kg)  11/05/13 164 lb (74.4 kg)  06/06/13 163 lb (73.9 kg)  04/23/13 166 lb (75.3 kg)  05/29/12 155 lb 0.6 oz (70.3 kg)  11/27/10 146 lb (66.2 kg)  09/24/09 152 lb (68.9 kg)  11/17/08 157 lb (71.2 kg)   Body mass index is 24.96 kg/m. Patient meets criteria for healthy wt for ht based on current BMI.   Pt reports seeing a homeopathic doctor on a regular basis. Apparently, he had recently expressed concern over unintentional weight loss. Patient does not really know what her UBW is. She estimates "150-something". Today's bed weight is 152.5 lbs. Per review of chart, her weight has been stable for the last 3 months.Though she may be down long term.   She says she has a strong appetite and always eats well, stating "lets go out to eat and ill show ya". She is anxiously awaiting diet advancement.   She really does not want to undergo surgery. She says she knows of a recipe that will rid herself of thousands of gallstones and cleanse her liver.   No nutrition interventions warranted at this time. If nutrition issues arise, please consult RD.   Burtis Junes RD, LDN, CNSC Clinical Nutrition Pager: [phone] 01/30/2017 4:07 PM

## 2017-01-30 NOTE — Progress Notes (Signed)
Patient ID: Ana [name], female   DOB: 1945-11-30, 71 y.o.   MRN: [id number]    PROGRESS NOTE  Ana [name]  OZY:[id number] DOB: Jan 05, 1946 DOA: 01/29/2017  PCP: Celene Squibb, MD   Brief Narrative:  Pt is 71 yo female with aortic insufficiency and s/o AVR on Warfarin, prior CVA, HLD, HTN, presented with RUQ abd pain associated with nausea and one episode of non bloody vomiting. US done in ED, notable for RUQ cholelithiasis and ? Acute cholecystitis.   Assessment & Plan:   Principal Problem:   Acute cholecystitis - pt reports feeling better today - continue Zosyn day #2 - continue to provide IVF, analgesia and antiemetics as needed - appreciate GI and surgery team assistance  - INR is still elevated, Coumadin has been on hold  - cardiology pre op clearance requested   Active Problems:   Hypertension, essential - continue Norvasc and Lisinopril     Seizure disorder ([place]) - stable for now    S/P AVR - cardiology consulted for clearance  - coumadin has been on hold     Thrombocytosis ([place]) - appears to be reactive - will repeat CBC in AM  DVT prophylaxis: On Coumadin but this has bene on hold in an anticipation of surgical intervention  Code Status: Full  Family Communication: Patient at bedside  Disposition Plan: to be determined   Consultants:   Cardiology  Surgery   Procedures:   None  Antimicrobials:   Zosyn 9/17 -->  Subjective: Pt reports feeling better.   Objective: Vitals:   01/29/17 1832 01/29/17 2002 01/30/17 0214 01/30/17 0607  BP: (!) 158/77 (!) 178/65 (!) 138/54 (!) 141/67  Pulse: 87 81 78 76  Resp: 18 18  18   Temp:  98.8 F (37.1 C)  98.5 F (36.9 C)  TempSrc:  Oral  Oral  SpO2: 96% 97%  100%  Weight:      Height:        Intake/Output Summary (Last 24 hours) at 01/30/17 1819 Last data filed at 01/30/17 1700  Gross per 24 hour  Intake            842.5 ml  Output             1000 ml  Net           -157.5 ml   Filed Weights   01/29/17 1146  Weight: 68 kg (150 lb)    Examination:  General exam: Appears calm and comfortable  Respiratory system: Clear to auscultation. Respiratory effort normal. Cardiovascular system: S1 & S2 heard, RRR. No JVD, murmurs, rubs, gallops or clicks. No pedal edema. Gastrointestinal system: Abdomen is nondistended, soft and slightly tender in epigastric area.  Central nervous system: Alert and oriented. No focal neurological deficits. Extremities: Symmetric 5 x 5 power. Skin: No rashes, lesions or ulcers Psychiatry: Judgement and insight appear normal. Mood & affect appropriate.    Data Reviewed: I have personally reviewed following labs and imaging studies  CBC:  Recent Labs Lab 01/29/17 1148 01/30/17 0546  WBC 19.4* 10.7*  NEUTROABS 17.0* 7.0  HGB 16.5* 13.6  HCT 48.7* 41.0  MCV 90.0 90.9  PLT 610* 048*   Basic Metabolic Panel:  Recent Labs Lab 01/29/17 1148 01/30/17 0546  NA 138 138  K 3.6 3.7  CL 100* 104  CO2 24 27  GLUCOSE 145* 107*  BUN 16 11  CREATININE 0.67 0.73  CALCIUM 9.4 8.5*   Liver Function Tests:  Recent Labs Lab  01/29/17 1148 01/30/17 0546  AST 27 37  ALT 26 35  ALKPHOS 77 59  BILITOT 0.5 0.8  PROT 8.3* 6.2*  ALBUMIN 4.7 3.4*    Recent Labs Lab 01/29/17 1148  LIPASE 32   Coagulation Profile:  Recent Labs Lab 01/29/17 1208 01/30/17 0546  INR 2.22 2.69   Urine analysis:    Component Value Date/Time   COLORURINE YELLOW 01/29/2017 1148   APPEARANCEUR CLEAR 01/29/2017 1148   LABSPEC 1.019 01/29/2017 1148   PHURINE 5.0 01/29/2017 1148   GLUCOSEU NEGATIVE 01/29/2017 1148   HGBUR NEGATIVE 01/29/2017 1148   BILIRUBINUR NEGATIVE 01/29/2017 1148   KETONESUR NEGATIVE 01/29/2017 1148   PROTEINUR NEGATIVE 01/29/2017 1148   UROBILINOGEN >8.0 (H) 11/27/2010 1021   NITRITE NEGATIVE 01/29/2017 1148   LEUKOCYTESUR NEGATIVE 01/29/2017 1148   Radiology Studies: Dg Chest 2 View  Result Date: 01/29/2017 CLINICAL DATA:  Right  upper quadrant abdominal pain EXAM: CHEST  2 VIEW COMPARISON:  09/26/2016 FINDINGS: Lungs are clear.  No pleural effusion or pneumothorax. The heart is normal in size. Thoracolumbar spine is within normal limits.  Median sternotomy. IMPRESSION: No evidence of acute cardiopulmonary disease. Electronically Signed   By: Julian Hy M.D.   On: 01/29/2017 15:03   Ct Abdomen Pelvis W Contrast  Result Date: 01/29/2017 CLINICAL DATA:  Right lower quadrant pain starting 3 a.m. today. EXAM: CT ABDOMEN AND PELVIS WITH CONTRAST TECHNIQUE: Multidetector CT imaging of the abdomen and pelvis was performed using the standard protocol following bolus administration of intravenous contrast. CONTRAST:  123mL ISOVUE-300 IOPAMIDOL (ISOVUE-300) INJECTION 61% COMPARISON:  None. FINDINGS: Lower chest: No acute abnormality. Hepatobiliary: No focal liver abnormality is seen. No gallstones, gallbladder wall thickening, or biliary dilatation. Pancreas: Unremarkable. No pancreatic ductal dilatation or surrounding inflammatory changes. Spleen: No focal lesion is identified within the spleen. Calcified vessels are identified in the splenic hilum. Adrenals/Urinary Tract: Adrenal glands are unremarkable. There is focal scar in the mid pole right kidney. Kidneys are otherwise normal, without renal calculi, focal lesion, or hydronephrosis. Bladder is unremarkable. Stomach/Bowel: There is a small hiatal hernia. Stomach is otherwise within normal limits. Appendix appears normal. No evidence of bowel wall thickening, distention, or inflammatory changes. There is diverticulosis of colon without diverticulitis. Vascular/Lymphatic: Aortic atherosclerosis. No enlarged abdominal or pelvic lymph nodes. Reproductive: Patient status post prior hysterectomy. No abnormal mass is identified in the bilateral adnexa. Other: None Musculoskeletal: Degenerative joint changes of the spine are noted. IMPRESSION: No acute abnormality identified in the abdomen  and pelvis. No evidence of diverticulitis.  The appendix is normal. Electronically Signed   By: Abelardo Diesel M.D.   On: 01/29/2017 14:14   US Abdomen Limited  Result Date: 01/29/2017 CLINICAL DATA:  Right upper quadrant pain. EXAM: ULTRASOUND ABDOMEN LIMITED RIGHT UPPER QUADRANT COMPARISON:  CT abdomen and pelvis from same day. FINDINGS: Gallbladder: Multiple gallstones noted. The gallbladder wall is thickened, measuring up to 7 mm. Small amount of pericholecystic fluid. No sonographic Murphy sign noted by sonographer. Common bile duct: Diameter: 3 mm, normal. Liver: No focal lesion identified. Within normal limits in parenchymal echogenicity. Portal vein is patent on color Doppler imaging with normal direction of blood flow towards the liver. IMPRESSION: Cholelithiasis with sonographic findings suggestive of acute cholecystitis, including gallbladder wall thickening and pericholecystic fluid. Electronically Signed   By: Titus Dubin M.D.   On: 01/29/2017 15:14   Scheduled Meds: . amLODipine  10 mg Oral QPM  . lisinopril  5 mg Oral Daily  .  pantoprazole (PROTONIX) IV  40 mg Intravenous Q24H   Continuous Infusions: . 0.9 % NaCl with KCl 20 mEq / L 75 mL/hr at 01/30/17 0940  . piperacillin-tazobactam (ZOSYN)  IV 3.375 g (01/30/17 1701)    LOS: 1 day   Time spent: 25 minutes   Faye Ramsay, MD [facility] Pager [phone]  If 7PM-7AM, please contact night-coverage [email or web address] Password [facility] 01/30/2017, 6:19 PM

## 2017-01-30 NOTE — Progress Notes (Signed)
  Subjective: Pain improved. No nausea. Tolerated some clears.  Still unsure about getting any surgery. INR remains elevated.  WBC coming down.   Objective: Vital signs in last 24 hours: Temp:  [98.3 F (36.8 C)-98.8 F (37.1 C)] 98.5 F (36.9 C) (09/18 0607) Pulse Rate:  [68-87] 76 (09/18 0607) Resp:  [16-18] 18 (09/18 0607) BP: (138-178)/(54-93) 141/67 (09/18 0607) SpO2:  [92 %-100 %] 100 % (09/18 0607) Weight:  [150 lb (68 kg)] 150 lb (68 kg) (09/17 1146) Last BM Date: 01/29/17  Intake/Output from previous day: 09/17 0701 - 09/18 0700 In: 1842.5 [I.V.:742.5; IV Piggyback:1100] Out: -  Intake/Out  General appearance: alert, cooperative and no distress Resp: normal work breathing GI: soft, non-tender; bowel sounds normal; no masses,  no organomegaly Extremities: extremities normal, atraumatic, no cyanosis or edema  Lab Results:   Recent Labs  01/29/17 1148 01/30/17 0546  WBC 19.4* 10.7*  HGB 16.5* 13.6  HCT 48.7* 41.0  PLT 610* 460*   BMET  Recent Labs  01/29/17 1148 01/30/17 0546  NA 138 138  K 3.6 3.7  CL 100* 104  CO2 24 27  GLUCOSE 145* 107*  BUN 16 11  CREATININE 0.67 0.73  CALCIUM 9.4 8.5*   PT/INR  Recent Labs  01/29/17 1208 01/30/17 0546  LABPROT 24.4* 28.4*  INR 2.22 2.69     Anti-infectives: Anti-infectives    Start     Dose/Rate Route Frequency Ordered Stop   01/30/17 0200  piperacillin-tazobactam (ZOSYN) IVPB 3.375 g     3.375 g 12.5 mL/hr over 240 Minutes Intravenous Every 8 hours 01/29/17 1707     01/29/17 1715  piperacillin-tazobactam (ZOSYN) IVPB 3.375 g     3.375 g 100 mL/hr over 30 Minutes Intravenous STAT 01/29/17 1707 01/29/17 1846      Assessment/Plan: Ana [name] is a 71 yo with acute cholecystitis with history of AVR on coumadin, INR 2.6 today, coumadin held yesterday. On zosyn and improving pain.  -Recommend cardiology evaluate for preoperative evaluation, need for any bridging coumadin -Patient unsure about  getting surgery, have discussed with her today and yesterday standard of care to remove the gallbladder after cholecystitis, will continue zosyn for now while INR elevated, and will need to continue for at least 72 hrs, if patient remains adamant about no surgery and family understands risks then we can discuss this option   -Updated Dr. Doyle Askew.    LOS: 1 day    Virl Cagey 01/30/2017

## 2017-01-31 ENCOUNTER — Ambulatory Visit: Payer: Self-pay | Admitting: Diagnostic Neuroimaging

## 2017-01-31 ENCOUNTER — Encounter (HOSPITAL_COMMUNITY): Payer: Self-pay | Admitting: Cardiology

## 2017-01-31 DIAGNOSIS — R9431 Abnormal electrocardiogram [ECG] [EKG]: Secondary | ICD-10-CM

## 2017-01-31 DIAGNOSIS — K810 Acute cholecystitis: Secondary | ICD-10-CM

## 2017-01-31 DIAGNOSIS — E785 Hyperlipidemia, unspecified: Secondary | ICD-10-CM

## 2017-01-31 DIAGNOSIS — I10 Essential (primary) hypertension: Secondary | ICD-10-CM

## 2017-01-31 DIAGNOSIS — Z952 Presence of prosthetic heart valve: Secondary | ICD-10-CM

## 2017-01-31 DIAGNOSIS — D473 Essential (hemorrhagic) thrombocythemia: Secondary | ICD-10-CM

## 2017-01-31 DIAGNOSIS — Z01810 Encounter for preprocedural cardiovascular examination: Secondary | ICD-10-CM

## 2017-01-31 LAB — COMPREHENSIVE METABOLIC PANEL
ALBUMIN: 3.5 g/dL (ref 3.5–5.0)
ALT: 29 U/L (ref 14–54)
AST: 24 U/L (ref 15–41)
Alkaline Phosphatase: 57 U/L (ref 38–126)
Anion gap: 8 (ref 5–15)
BUN: 12 mg/dL (ref 6–20)
CALCIUM: 8.7 mg/dL — AB (ref 8.9–10.3)
CO2: 27 mmol/L (ref 22–32)
Chloride: 104 mmol/L (ref 101–111)
Creatinine, Ser: 0.82 mg/dL (ref 0.44–1.00)
GFR calc Af Amer: >60 mL/min (ref >60)
GLUCOSE: 95 mg/dL (ref 65–99)
POTASSIUM: 3.5 mmol/L (ref 3.5–5.1)
Sodium: 139 mmol/L (ref 135–145)
TOTAL PROTEIN: 6.3 g/dL — AB (ref 6.5–8.1)
Total Bilirubin: 1.1 mg/dL (ref 0.3–1.2)

## 2017-01-31 LAB — CBC WITH DIFFERENTIAL/PLATELET
Basophils Absolute: 0 10*3/uL (ref 0.0–0.1)
Basophils Relative: 1 %
Eosinophils Absolute: 0.3 10*3/uL (ref 0.0–0.7)
Eosinophils Relative: 4 %
HEMATOCRIT: 41.2 % (ref 36.0–46.0)
Hemoglobin: 13.8 g/dL (ref 12.0–15.0)
Lymphocytes Relative: 27 %
Lymphs Abs: 2.3 10*3/uL (ref 0.7–4.0)
MCH: 30.4 pg (ref 26.0–34.0)
MCHC: 33.5 g/dL (ref 30.0–36.0)
MCV: 90.7 fL (ref 78.0–100.0)
MONO ABS: 1.1 10*3/uL — AB (ref 0.1–1.0)
Monocytes Relative: 13 %
Neutro Abs: 4.9 10*3/uL (ref 1.7–7.7)
Neutrophils Relative %: 57 %
Platelets: 442 10*3/uL — ABNORMAL HIGH (ref 150–400)
RBC: 4.54 MIL/uL (ref 3.87–5.11)
RDW: 13.7 % (ref 11.5–15.5)
WBC: 8.6 10*3/uL (ref 4.0–10.5)

## 2017-01-31 LAB — PROTIME-INR
INR: 2.03
PROTHROMBIN TIME: 22.8 s — AB (ref 11.4–15.2)

## 2017-01-31 MED ORDER — AMOXICILLIN-POT CLAVULANATE 875-125 MG PO TABS
1.0000 | ORAL_TABLET | Freq: Two times a day (BID) | ORAL | Status: DC
  Administered 2017-01-31 – 2017-02-01 (×3): 1 via ORAL
  Filled 2017-01-31 (×3): qty 1

## 2017-01-31 MED ORDER — WARFARIN - PHARMACIST DOSING INPATIENT: Status: DC

## 2017-01-31 MED ORDER — WARFARIN SODIUM 5 MG PO TABS
10.0000 mg | ORAL_TABLET | Freq: Once | ORAL | Status: AC
  Administered 2017-01-31: 10 mg via ORAL
  Filled 2017-01-31: qty 2

## 2017-01-31 NOTE — Progress Notes (Signed)
Pt refusing SCDs stating she is getting up the bathroom to walk and if she wears them she won't be able to sleep. Pt educated on purpose for the SCDs. Pt veralized understanding and continues to refuse. Will continue to monitor pt

## 2017-01-31 NOTE — Progress Notes (Signed)
  Subjective: No major issues overnight. Reports feeling much better. Hungry.   Objective: Vital signs in last 24 hours: Temp:  [98.1 F (36.7 C)-98.4 F (36.9 C)] 98.4 F (36.9 C) (09/19 0601) Pulse Rate:  [65] 65 (09/19 0601) Resp:  [16-20] 16 (09/19 0601) BP: (128-141)/(63-67) 141/63 (09/19 0601) SpO2:  [88 %-99 %] 97 % (09/19 0601) Last BM Date: 01/29/17  Intake/Output from previous day: 09/18 0701 - 09/19 0700 In: 1691.3 [I.V.:1541.3; IV Piggyback:150] Out: 2100 [Urine:2100] Intake/Output this shift: No intake/output data recorded.  General appearance: alert, cooperative and no distress GI: soft, non-tender; bowel sounds normal; no masses,  no organomegaly  Lab Results:   Recent Labs  01/30/17 0546 01/31/17 0613  WBC 10.7* 8.6  HGB 13.6 13.8  HCT 41.0 41.2  PLT 460* 442*   BMET  Recent Labs  01/30/17 0546 01/31/17 0613  NA 138 139  K 3.7 3.5  CL 104 104  CO2 27 27  GLUCOSE 107* 95  BUN 11 12  CREATININE 0.73 0.82  CALCIUM 8.5* 8.7*   PT/INR  Recent Labs  01/30/17 0546 01/31/17 0613  LABPROT 28.4* 22.8*  INR 2.69 2.03     Anti-infectives: Anti-infectives    Start     Dose/Rate Route Frequency Ordered Stop   01/31/17 1015  amoxicillin-clavulanate (AUGMENTIN) 875-125 MG per tablet 1 tablet     1 tablet Oral Every 12 hours 01/31/17 1001     01/30/17 0200  piperacillin-tazobactam (ZOSYN) IVPB 3.375 g  Status:  Discontinued     3.375 g 12.5 mL/hr over 240 Minutes Intravenous Every 8 hours 01/29/17 1707 01/31/17 1001   01/29/17 1715  piperacillin-tazobactam (ZOSYN) IVPB 3.375 g     3.375 g 100 mL/hr over 30 Minutes Intravenous STAT 01/29/17 1707 01/29/17 1846      Assessment/Plan: Ana [name] is a 22 with with acute cholecystitis with AVR that was on coumadin and has been receiving antibiotics for several days pending INR levels and cardiology evaluation. The patient reports she does not want surgery and wants to follow up as an outpatient.    She states that she understands the risk of recurrent cholecystitis, the risk of a liver infection (cholangitis) and possible need for a cholecystostomy tube in the future if she represents due to her medical issues and need for anticoagulation.  I have discussed multiple times with her the standard of care is to remove the gallbladder pending her being healthy enough to undergo anesthesia, and she understands but does not want to pursue this and wants to go home to do a "cleanse."  We discussed this extensively and I will respect her decision.  Diet as tolerated Can change to augmentin to complete 7 day course Follow up in my clinic in 2-3 weeks. Discussed with Dr. Wynelle Cleveland.   Curlene Labrum, MD [facility] [address] [place], [place] [address] [phone] (office)
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
## 2017-01-31 NOTE — Progress Notes (Addendum)
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 Found to have Cholelithiasis on Ultrasound with a possibility of cholecystitis.     Subjective:  Pain has improved. No nausea or vomiting.  ROS: no complaints of nausea, vomiting, constipation diarrhea, cough, dyspnea or dysuria. No other complaints.   Assessment & Plan:   Principal Problem:   Acute cholecystitis/   Cholelithiasis  - the patient has declined surgery - have discussed fat free diet with her and have started it - d/c IVF - will transition Zosyn to Augmentin today- follow for recurrence - cardiology recommend Lexiscan if she was ever to agree to surgery-    Active Problems:    S/P AVR - INR still therapeutic today- Coumadin being resumed - f/u with Dr Harl Bowie recommended by Dr Domenic Polite- their office will make the appt     Hypertension - Lisinopril, Norvasc  Thrombocytosis - chronic    DVT prophylaxis: cont Coumadin Code Status: Full code  Family Communication:  Disposition Plan: home when stable Consultants:   gen surgery  cardiology Procedures:  2 D ECHO  Mild LVH with LVEF 60-65%. Indeterminate diastolic function. Mildly calcified mitral annulus with trivial mitral regurgitation. St. Jude mechanical AVR in position with trivial aortic regurgitation and transvalvular gradient suggesting adequate function. Trivial tricuspid regurgitation.   Antimicrobials:  Anti-infectives    Start     Dose/Rate Route Frequency Ordered Stop   01/31/17 1015  amoxicillin-clavulanate (AUGMENTIN) 875-125 MG per tablet 1 tablet     1 tablet Oral Every 12 hours 01/31/17 1001     01/30/17 0200  piperacillin-tazobactam (ZOSYN) IVPB 3.375 g  Status:  Discontinued     3.375 g 12.5 mL/hr over 240 Minutes  Intravenous Every 8 hours 01/29/17 1707 01/31/17 1001   01/29/17 1715  piperacillin-tazobactam (ZOSYN) IVPB 3.375 g     3.375 g 100 mL/hr over 30 Minutes Intravenous STAT 01/29/17 1707 01/29/17 1846       Objective: Vitals:   01/30/17 2100 01/31/17 0601 01/31/17 1405 01/31/17 1458  BP: 128/67 (!) 141/63  138/67  Pulse: 65 65  74  Resp: 20 16  16   Temp: 98.1 F (36.7 C) 98.4 F (36.9 C)  98.1 F (36.7 C)  TempSrc: Oral Oral  Oral  SpO2: 99% 97% 93% 93%  Weight:      Height:        Intake/Output Summary (Last 24 hours) at 01/31/17 1733 Last data filed at 01/31/17 1300  Gross per 24 hour  Intake          1931.25 ml  Output             1100 ml  Net           831.25 ml   Filed Weights   01/29/17 1146  Weight: 68 kg (150 lb)    Examination: General exam: Appears comfortable  HEENT: PERRLA, oral mucosa moist, no sclera icterus or thrush Respiratory system: Clear to auscultation. Respiratory effort normal. Cardiovascular system: S1 & S2 heard, RRR.  No murmurs  Gastrointestinal system: Abdomen soft, non-tender, nondistended. Normal bowel sound. No organomegaly Central nervous system: Alert and oriented. No focal neurological deficits. Extremities: No cyanosis, clubbing or edema Skin: No rashes or ulcers Psychiatry:  Mood & affect appropriate.  Data Reviewed: I have personally reviewed following labs and imaging studies  CBC:  Recent Labs Lab 01/29/17 1148 01/30/17 0546 01/31/17 0613  WBC 19.4* 10.7* 8.6  NEUTROABS 17.0* 7.0 4.9  HGB 16.5* 13.6 13.8  HCT 48.7* 41.0 41.2  MCV 90.0 90.9 90.7  PLT 610* 460* 481*   Basic Metabolic Panel:  Recent Labs Lab 01/29/17 1148 01/30/17 0546 01/31/17 0613  NA 138 138 139  K 3.6 3.7 3.5  CL 100* 104 104  CO2 24 27 27   GLUCOSE 145* 107* 95  BUN 16 11 12   CREATININE 0.67 0.73 0.82  CALCIUM 9.4 8.5* 8.7*   GFR: Estimated Creatinine Clearance: 57.4 mL/min (by C-G formula based on SCr of 0.82 mg/dL). Liver  Function Tests:  Recent Labs Lab 01/29/17 1148 01/30/17 0546 01/31/17 0613  AST 27 37 24  ALT 26 35 29  ALKPHOS 77 59 57  BILITOT 0.5 0.8 1.1  PROT 8.3* 6.2* 6.3*  ALBUMIN 4.7 3.4* 3.5    Recent Labs Lab 01/29/17 1148  LIPASE 32   No results for input(s): AMMONIA in the last 168 hours. Coagulation Profile:  Recent Labs Lab 01/29/17 1208 01/30/17 0546 01/31/17 0613  INR 2.22 2.69 2.03   Cardiac Enzymes: No results for input(s): CKTOTAL, CKMB, CKMBINDEX, TROPONINI in the last 168 hours. BNP (last 3 results) No results for input(s): PROBNP in the last 8760 hours. HbA1C: No results for input(s): HGBA1C in the last 72 hours. CBG: No results for input(s): GLUCAP in the last 168 hours. Lipid Profile: No results for input(s): CHOL, HDL, LDLCALC, TRIG, CHOLHDL, LDLDIRECT in the last 72 hours. Thyroid Function Tests: No results for input(s): TSH, T4TOTAL, FREET4, T3FREE, THYROIDAB in the last 72 hours. Anemia Panel: No results for input(s): VITAMINB12, FOLATE, FERRITIN, TIBC, IRON, RETICCTPCT in the last 72 hours. Urine analysis:    Component Value Date/Time   COLORURINE YELLOW 01/29/2017 [place] 01/29/2017 1148   LABSPEC 1.019 01/29/2017 1148   PHURINE 5.0 01/29/2017 1148   GLUCOSEU NEGATIVE 01/29/2017 1148   HGBUR NEGATIVE 01/29/2017 1148   BILIRUBINUR NEGATIVE 01/29/2017 1148   KETONESUR NEGATIVE 01/29/2017 1148   PROTEINUR NEGATIVE 01/29/2017 1148   UROBILINOGEN >8.0 (H) 11/27/2010 1021   NITRITE NEGATIVE 01/29/2017 1148   LEUKOCYTESUR NEGATIVE 01/29/2017 1148   Sepsis Labs: @LABRCNTIP (procalcitonin:4,lacticidven:4) )No results found for this or any previous visit (from the past 240 hour(s)).       Radiology Studies: No results found.    Scheduled Meds: . amLODipine  10 mg Oral QPM  . amoxicillin-clavulanate  1 tablet Oral Q12H  . lisinopril  5 mg Oral Daily  . pantoprazole (PROTONIX) IV  40 mg Intravenous Q24H  . warfarin  10  mg Oral Once  . [START ON 02/01/2017] Warfarin - Pharmacist Dosing Inpatient   Does not apply Q24H   Continuous Infusions: . 0.9 % NaCl with KCl 20 mEq / L 75 mL/hr at 01/31/17 1035     LOS: 2 days    Time spent in minutes: 35    Debbe Odea, MD [facility] Pager: [email or web address] Password [facility] 01/31/2017, 5:33 PM
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
## 2017-01-31 NOTE — Progress Notes (Signed)
Pharmacy Antibiotic Note  Ana [name] is a 71 y.o. female admitted on 01/29/2017 with intra-abdominal infection.  Pharmacy has been consulted to change zosyn to augmentin.  Plan: Augmentin 875/ 125 po bid Monitor labs, progress, c/s  Height: 5\' 5"  (165.1 cm) Weight: 150 lb (68 kg) IBW/kg (Calculated) : 57  Temp (24hrs), Avg:98.3 F (36.8 C), Min:98.1 F (36.7 C), Max:98.4 F (36.9 C)   Recent Labs Lab 01/29/17 1148 01/30/17 0546 01/31/17 0613  WBC 19.4* 10.7* 8.6  CREATININE 0.67 0.73 0.82    Estimated Creatinine Clearance: 57.4 mL/min (by C-G formula based on SCr of 0.82 mg/dL).   Allergies  Allergen Reactions  . Blood-Group Specific Substance     NO BLOOD PRODUCTS   Antimicrobials this admission: Zosyn 9/17 >> 9/19 augmentin  9/19>>  Thank you for allowing pharmacy to be a part of this patient's care.  Excell Seltzer [place] 01/31/2017 10:02 AM

## 2017-02-01 DIAGNOSIS — K8080 Other cholelithiasis without obstruction: Secondary | ICD-10-CM

## 2017-02-01 DIAGNOSIS — G40909 Epilepsy, unspecified, not intractable, without status epilepticus: Secondary | ICD-10-CM

## 2017-02-01 LAB — COMPREHENSIVE METABOLIC PANEL
ALT: 27 U/L (ref 14–54)
ANION GAP: 7 (ref 5–15)
AST: 20 U/L (ref 15–41)
Albumin: 3.6 g/dL (ref 3.5–5.0)
Alkaline Phosphatase: 58 U/L (ref 38–126)
BUN: 13 mg/dL (ref 6–20)
CALCIUM: 9 mg/dL (ref 8.9–10.3)
CO2: 28 mmol/L (ref 22–32)
Chloride: 103 mmol/L (ref 101–111)
Creatinine, Ser: 0.61 mg/dL (ref 0.44–1.00)
GFR calc Af Amer: >60 mL/min (ref >60)
GLUCOSE: 104 mg/dL — AB (ref 65–99)
Potassium: 3.5 mmol/L (ref 3.5–5.1)
Sodium: 138 mmol/L (ref 135–145)
Total Bilirubin: 0.7 mg/dL (ref 0.3–1.2)
Total Protein: 6.6 g/dL (ref 6.5–8.1)

## 2017-02-01 LAB — CBC WITH DIFFERENTIAL/PLATELET
Basophils Absolute: 0.1 10*3/uL (ref 0.0–0.1)
Basophils Relative: 1 %
EOS PCT: 5 %
Eosinophils Absolute: 0.4 10*3/uL (ref 0.0–0.7)
HCT: 41.6 % (ref 36.0–46.0)
Hemoglobin: 14 g/dL (ref 12.0–15.0)
LYMPHS ABS: 2.5 10*3/uL (ref 0.7–4.0)
LYMPHS PCT: 30 %
MCH: 30.2 pg (ref 26.0–34.0)
MCHC: 33.7 g/dL (ref 30.0–36.0)
MCV: 89.7 fL (ref 78.0–100.0)
MONO ABS: 1 10*3/uL (ref 0.1–1.0)
MONOS PCT: 12 %
Neutro Abs: 4.4 10*3/uL (ref 1.7–7.7)
Neutrophils Relative %: 52 %
PLATELETS: 459 10*3/uL — AB (ref 150–400)
RBC: 4.64 MIL/uL (ref 3.87–5.11)
RDW: 13.5 % (ref 11.5–15.5)
WBC: 8.4 10*3/uL (ref 4.0–10.5)

## 2017-02-01 LAB — PROTIME-INR
INR: 1.47
Prothrombin Time: 17.7 seconds — ABNORMAL HIGH (ref 11.4–15.2)

## 2017-02-01 MED ORDER — AMOXICILLIN-POT CLAVULANATE 875-125 MG PO TABS: 1.0000 | ORAL_TABLET | Freq: Two times a day (BID) | ORAL | 0 refills | Status: DC

## 2017-02-01 NOTE — Care Management Note (Signed)
Case Management Note  Patient Details  Name: Ana [name] MRN: [id number] Date of Birth: [birth year]  Subjective/Objective:                  Pt admitted with cholecystitis, has refused surgery. Chart reviewed for CM needs. Pt is from home, lives with children. She is ind with ADL's. Has no HH or DME needs pta. She has PCP, transportation and insurance with drug coverage. Pt will have f/u appointments with cardiology and surgery.   Action/Plan: DC home today with self care. No CM needs noted at time of DC.  Expected Discharge Date:  02/01/17               Expected Discharge Plan:  Home/Self Care  In-House Referral:  NA  Discharge planning Services  CM Consult  Post Acute Care Choice:  NA Choice offered to:  NA  Status of Service:  Completed, signed off  Sherald Barge, RN 02/01/2017, 10:46 AM

## 2017-02-01 NOTE — Discharge Summary (Signed)
Physician Discharge Summary  Ana [name]:[id number] DOB: 10-26-1945 DOA: 01/29/2017  PCP: Celene Squibb, MD  Admit date: 01/29/2017 Discharge date: 02/01/2017  Admitted From: home Disposition:  home   Recommendations for Outpatient Follow-up:  1. 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 Found to have Cholelithiasis and cholecystitis on Ultrasound.[name]     Hospital Course:  Principal Problem:   Acute cholecystitis/   Cholelithiasis  - the patient has declined surgery - have discussed fat free diet with her and have started it- she is tolerating it well without abdominal pain   transitioned Zosyn to Augmentin for 1 more week per gen surgery - cardiology recommend Lexiscan if she was ever to agree to surgery-    Active Problems:    S/P AVR - INR 1.47 today- Coumadin held 2 days and resumed yesterday- advised to have INR checked in 2-3 days - f/u with Dr Harl Bowie recommended by Dr Domenic Polite- their office has made an appt  Hypertension - Lisinopril, Norvasc  Thrombocytosis - chronic   Discharge Instructions  Discharge Instructions    Call MD for:  persistant nausea and vomiting    Complete by:  As directed    Call MD for:  severe uncontrolled pain    Complete by:  As directed    Call MD for:  temperature >100.4    Complete by:  As directed    Diet - low sodium heart healthy    Complete by:  As directed    And low fat diet   Increase activity slowly    Complete by:  As directed      Allergies as of 02/01/2017       Reactions   Blood-group Specific Substance    NO BLOOD PRODUCTS      Medication List    TAKE these medications   amLODipine 10 MG tablet Commonly known as:  NORVASC Take 10 mg by mouth every evening.   amoxicillin-clavulanate 875-125 MG tablet Commonly known as:  AUGMENTIN Take 1 tablet by mouth every 12 (twelve) hours.   B-12 COMPLIANCE INJECTION 1000 MCG/ML Kit Generic drug:  Cyanocobalamin Inject 1 mL as directed See admin instructions. EVERY 4 DAYS   DHEA PO Take 14 mg by mouth daily.   lisinopril 5 MG tablet Commonly known as:  PRINIVIL,ZESTRIL Take 1 tablet by mouth daily.   UNABLE TO FIND Take 7.5 mg by mouth every evening. Med Name: pregnenalone. SUPPLEMENT FOR FATIGUE   warfarin 6 MG tablet Commonly known as:  COUMADIN Take 9 mg by mouth daily at 6 PM. Takes 1.5 tablet to equal 9 mg on Mondays and Thursdays and 7 mg all other days of the week   warfarin 1 MG tablet Commonly known as:  COUMADIN Take 1 mg by mouth daily at 6 PM. Takes a 1 mg and 6 mg to equal 7 mg all days of the week except on Mondays and Thursdays and takes 9 mg  Discharge Care Instructions        Start     Ordered   02/01/17 0000  amoxicillin-clavulanate (AUGMENTIN) 875-125 MG tablet  Every 12 hours     02/01/17 0950   02/01/17 0000  Increase activity slowly     02/01/17 0950   02/01/17 0000  Diet - low sodium heart healthy     02/01/17 0950   02/01/17 0000  Call MD for:  temperature >100.4     02/01/17 0950   02/01/17 0000  Call MD for:  persistant nausea and vomiting     02/01/17 0950   02/01/17 0000  Call MD for:  severe uncontrolled pain     02/01/17 0950     Follow-up Information    Arnoldo Lenis, MD On 02/09/2017.   Specialty:  Cardiology Why:  at 1:40 pm Contact information: [place] [id number] [phone]          Allergies  Allergen Reactions  . Blood-Group Specific Substance     NO BLOOD PRODUCTS      Procedures/Studies:  Dg Chest 2 View  Result Date: 01/29/2017 CLINICAL DATA:  Right upper quadrant abdominal pain EXAM: CHEST  2 VIEW COMPARISON:  09/26/2016 FINDINGS: Lungs are clear.  No pleural effusion or pneumothorax. The heart is normal in size. Thoracolumbar spine is within normal limits.  Median sternotomy. IMPRESSION: No evidence of acute cardiopulmonary disease. Electronically Signed   By: Julian Hy M.D.   On: 01/29/2017 15:03   Ct Abdomen Pelvis W Contrast  Result Date: 01/29/2017 CLINICAL DATA:  Right lower quadrant pain starting 3 a.m. today. EXAM: CT ABDOMEN AND PELVIS WITH CONTRAST TECHNIQUE: Multidetector CT imaging of the abdomen and pelvis was performed using the standard protocol following bolus administration of intravenous contrast. CONTRAST:  136m ISOVUE-300 IOPAMIDOL (ISOVUE-300) INJECTION 61% COMPARISON:  None. FINDINGS: Lower chest: No acute abnormality. Hepatobiliary: No focal liver abnormality is seen. No gallstones, gallbladder wall thickening, or biliary dilatation. Pancreas: Unremarkable. No pancreatic ductal dilatation or surrounding inflammatory changes. Spleen: No focal lesion is identified within the spleen. Calcified vessels are identified in the splenic hilum. Adrenals/Urinary Tract: Adrenal glands are unremarkable. There is focal scar in the mid pole right kidney. Kidneys are otherwise normal, without renal calculi, focal lesion, or hydronephrosis. Bladder is unremarkable. Stomach/Bowel: There is a small hiatal hernia. Stomach is otherwise within normal limits. Appendix appears normal. No evidence of bowel wall thickening, distention, or inflammatory changes. There is diverticulosis of colon without diverticulitis. Vascular/Lymphatic: Aortic atherosclerosis. No enlarged abdominal or pelvic lymph nodes. Reproductive: Patient status post prior hysterectomy. No abnormal mass is identified in the bilateral adnexa. Other: None Musculoskeletal: Degenerative  joint changes of the spine are noted. IMPRESSION: No acute abnormality identified in the abdomen and pelvis. No evidence of diverticulitis.  The appendix is normal. Electronically Signed   By: WAbelardo DieselM.D.   On: 01/29/2017 14:14   UKoreaAbdomen Limited  Result Date: 01/29/2017 CLINICAL DATA:  Right upper quadrant pain. EXAM: ULTRASOUND ABDOMEN LIMITED RIGHT UPPER QUADRANT COMPARISON:  CT abdomen and pelvis from same day. FINDINGS: Gallbladder: Multiple gallstones noted. The gallbladder wall is thickened, measuring up to 7 mm. Small amount of pericholecystic fluid. No sonographic Murphy sign noted by sonographer. Common bile duct: Diameter: 3 mm, normal. Liver: No focal lesion identified. Within normal limits in parenchymal echogenicity. Portal vein is patent on color Doppler imaging with normal direction of blood flow towards the liver. IMPRESSION: Cholelithiasis with sonographic findings suggestive  of acute cholecystitis, including gallbladder wall thickening and pericholecystic fluid. Electronically Signed   By: Titus Dubin M.D.   On: 01/29/2017 15:14        Discharge Exam: Vitals:   01/31/17 2145 02/01/17 0637  BP: 135/61 133/64  Pulse: 76 71  Resp: 18 18  Temp: 98.4 F (36.9 C) 98.1 F (36.7 C)  SpO2: 93% 94%   Vitals:   01/31/17 1405 01/31/17 1458 01/31/17 2145 02/01/17 0637  BP:  138/67 135/61 133/64  Pulse:  74 76 71  Resp:  16 18 18   Temp:  98.1 F (36.7 C) 98.4 F (36.9 C) 98.1 F (36.7 C)  TempSrc:  Oral Oral Oral  SpO2: 93% 93% 93% 94%  Weight:      Height:        General: Pt is alert, awake, not in acute distress Cardiovascular: RRR, S1/S2 +, no rubs, no gallops Respiratory: CTA bilaterally, no wheezing, no rhonchi Abdominal: Soft, NT, ND, bowel sounds + Extremities: no edema, no cyanosis    The results of significant diagnostics from this hospitalization (including imaging, microbiology, ancillary and laboratory) are listed below for reference.      Microbiology: No results found for this or any previous visit (from the past 240 hour(s)).   Labs: BNP (last 3 results) No results for input(s): BNP in the last 8760 hours. Basic Metabolic Panel:  Recent Labs Lab 01/29/17 1148 01/30/17 0546 01/31/17 0613 02/01/17 0620  NA 138 138 139 138  K 3.6 3.7 3.5 3.5  CL 100* 104 104 103  CO2 24 27 27 28   GLUCOSE 145* 107* 95 104*  BUN 16 11 12 13   CREATININE 0.67 0.73 0.82 0.61  CALCIUM 9.4 8.5* 8.7* 9.0   Liver Function Tests:  Recent Labs Lab 01/29/17 1148 01/30/17 0546 01/31/17 0613 02/01/17 0620  AST 27 37 24 20  ALT 26 35 29 27  ALKPHOS 77 59 57 58  BILITOT 0.5 0.8 1.1 0.7  PROT 8.3* 6.2* 6.3* 6.6  ALBUMIN 4.7 3.4* 3.5 3.6    Recent Labs Lab 01/29/17 1148  LIPASE 32   No results for input(s): AMMONIA in the last 168 hours. CBC:  Recent Labs Lab 01/29/17 1148 01/30/17 0546 01/31/17 0613 02/01/17 0620  WBC 19.4* 10.7* 8.6 8.4  NEUTROABS 17.0* 7.0 4.9 4.4  HGB 16.5* 13.6 13.8 14.0  HCT 48.7* 41.0 41.2 41.6  MCV 90.0 90.9 90.7 89.7  PLT 610* 460* 442* 459*   Cardiac Enzymes: No results for input(s): CKTOTAL, CKMB, CKMBINDEX, TROPONINI in the last 168 hours. BNP: Invalid input(s): POCBNP CBG: No results for input(s): GLUCAP in the last 168 hours. D-Dimer No results for input(s): DDIMER in the last 72 hours. Hgb A1c No results for input(s): HGBA1C in the last 72 hours. Lipid Profile No results for input(s): CHOL, HDL, LDLCALC, TRIG, CHOLHDL, LDLDIRECT in the last 72 hours. Thyroid function studies No results for input(s): TSH, T4TOTAL, T3FREE, THYROIDAB in the last 72 hours.  Invalid input(s): FREET3 Anemia work up No results for input(s): VITAMINB12, FOLATE, FERRITIN, TIBC, IRON, RETICCTPCT in the last 72 hours. Urinalysis    Component Value Date/Time   COLORURINE YELLOW 01/29/2017 1148   APPEARANCEUR CLEAR 01/29/2017 1148   LABSPEC 1.019 01/29/2017 1148   PHURINE 5.0 01/29/2017 1148    GLUCOSEU NEGATIVE 01/29/2017 1148   HGBUR NEGATIVE 01/29/2017 1148   BILIRUBINUR NEGATIVE 01/29/2017 [place] 01/29/2017 [place] 01/29/2017 1148   UROBILINOGEN >8.0 (H) 11/27/2010  [place] 01/29/2017 [place] 01/29/2017 1148   Sepsis Labs Invalid input(s): PROCALCITONIN,  WBC,  LACTICIDVEN Microbiology No results found for this or any previous visit (from the past 240 hour(s)).   Time coordinating discharge: Over 30 minutes  SIGNED:   Debbe Odea, MD  [facility] 02/01/2017, 9:51 AM Pager   If 7PM-7AM, please contact night-coverage [email or web address] Password [email or web address]

## 2017-02-01 NOTE — Discharge Instructions (Signed)
Please have your INR checked tomorrow. Thank you.   Please take all your medications with you for your next visit with your Primary MD. Please request your Primary MD to go over all hospital test results at the follow up. Please ask your Primary MD to get all Hospital records sent to his/her office.  If you experience worsening of your admission symptoms, develop shortness of breath, chest pain, suicidal or homicidal thoughts or a life threatening emergency, you must seek medical attention immediately by calling 911 or calling your MD.  Dennis Bast must read the complete instructions/literature along with all the possible adverse reactions/side effects for all the medicines you take including new medications that have been prescribed to you. Take new medicines after you have completely understood and accpet all the possible adverse reactions/side effects.   Do not drive when taking pain medications or sedatives.    Do not take more than prescribed Pain, Sleep and Anxiety Medications  If you have smoked or chewed Tobacco in the last 2 yrs please stop. Stop any regular alcohol and or recreational drug use.  Wear Seat belts while driving.

## 2017-02-01 NOTE — Care Management Important Message (Signed)
Important Message  Patient Details  Name: Ana [name] MRN: [id number] Date of Birth: December 25, 1945   Medicare Important Message Given:  Yes    Sherald Barge, RN 02/01/2017, 10:45 AM

## 2017-02-01 NOTE — Progress Notes (Signed)
RN went and checked on pt for hourly rounds and pt was very upset stating she can't get any rest in the hospital. RN apologized for pt not getting any rest but adv her that we are supposed to make hourly rounds on our pts. Pt stated "Yea but you wake me up when you come in here every time. " RN apologized and adv pt that if I see they are sleeping then I shut the door back, if pt is awake I ask if they need anything. Pt asked RN not to come back in room because she wanted to rest.

## 2017-02-01 NOTE — Progress Notes (Signed)
Patient is to be discharged home and in stable condition. IV and telemetry removed, WNL. Patient given discharge instructions and patient verbalized understanding. Patient escorted out by staff via wheelchair.  Celestia Khat, RN

## 2017-02-05 ENCOUNTER — Ambulatory Visit (INDEPENDENT_AMBULATORY_CARE_PROVIDER_SITE_OTHER): Payer: Medicare Other | Admitting: Diagnostic Neuroimaging

## 2017-02-05 ENCOUNTER — Encounter: Payer: Self-pay | Admitting: Diagnostic Neuroimaging

## 2017-02-05 VITALS — BP 152/89 | HR 83 | Ht 65.0 in | Wt 149.6 lb

## 2017-02-05 DIAGNOSIS — R200 Anesthesia of skin: Secondary | ICD-10-CM

## 2017-02-05 DIAGNOSIS — R252 Cramp and spasm: Secondary | ICD-10-CM | POA: Diagnosis not present

## 2017-02-05 DIAGNOSIS — M542 Cervicalgia: Secondary | ICD-10-CM | POA: Diagnosis not present

## 2017-02-05 DIAGNOSIS — G959 Disease of spinal cord, unspecified: Secondary | ICD-10-CM | POA: Diagnosis not present

## 2017-02-05 DIAGNOSIS — R413 Other amnesia: Secondary | ICD-10-CM | POA: Diagnosis not present

## 2017-02-05 DIAGNOSIS — R51 Headache: Secondary | ICD-10-CM

## 2017-02-05 DIAGNOSIS — R519 Headache, unspecified: Secondary | ICD-10-CM

## 2017-02-05 NOTE — Patient Instructions (Signed)
Thank you for coming to see [name] at [facility]. I hope we have been able to provide you high quality care today.  You may receive a patient satisfaction survey over the next few weeks. We would appreciate your feedback and comments so that we may continue to improve ourselves and the health of our patients.  - check MRI brain and EEG   ~~~~~~~~~~~~~~~~~~~~~~~~~~~~~~~~~~~~~~~~~~~~~~~~~~~~~~~~~~~~~~~~~  DR. Lucerito Rosinski'S GUIDE TO HAPPY AND HEALTHY LIVING These are some of my general health and wellness recommendations. Some of them may apply to you better than others. Please use common sense as you try these suggestions and feel free to ask me any questions.   ACTIVITY/FITNESS Mental, social, emotional and physical stimulation are very important for brain and body health. Try learning a new activity (arts, music, language, sports, games).  Keep moving your body to the best of your abilities. You can do this at home, inside or outside, the park, community center, gym or anywhere you like. Consider a physical therapist or personal trainer to get started. Consider the app Sworkit. Fitness trackers such as smart-watches, smart-phones or Fitbits can help as well.   NUTRITION Eat more plants: colorful vegetables, nuts, seeds and berries.  Eat less sugar, salt, preservatives and processed foods.  Avoid toxins such as cigarettes and alcohol.  Drink water when you are thirsty. Warm water with a slice of lemon is an excellent morning drink to start the day.  Consider these websites for more information The Nutrition Source (https://www.henry-hernandez.biz/) Precision Nutrition (WindowBlog.ch)   RELAXATION Consider practicing mindfulness meditation or other relaxation techniques such as deep breathing, prayer, yoga, tai chi, massage. See website mindful.org or the apps Headspace or Calm to help get started.   SLEEP Try to get at  least 7-8+ hours sleep per day. Regular exercise and reduced caffeine will help you sleep better. Practice good sleep hygeine techniques. See website sleep.org for more information.   PLANNING Prepare estate planning, living will, healthcare POA documents. Sometimes this is best planned with the help of an attorney. Theconversationproject.org and agingwithdignity.org are excellent resources.
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
## 2017-02-06 ENCOUNTER — Telehealth: Payer: Self-pay | Admitting: Diagnostic Neuroimaging

## 2017-02-06 ENCOUNTER — Other Ambulatory Visit (HOSPITAL_COMMUNITY): Payer: Self-pay | Admitting: *Deleted

## 2017-02-06 DIAGNOSIS — E611 Iron deficiency: Secondary | ICD-10-CM

## 2017-02-06 DIAGNOSIS — D751 Secondary polycythemia: Secondary | ICD-10-CM

## 2017-02-06 NOTE — Telephone Encounter (Signed)
Patient returning your call.

## 2017-02-06 NOTE — Telephone Encounter (Signed)
Patient is scheduled to have her MRI's done at [facility] due to the implant that she has. For Friday Oct 5 arrival time is 2:30 pm. Patient is aware of time and day.

## 2017-02-07 ENCOUNTER — Encounter (HOSPITAL_COMMUNITY): Payer: Medicare Other | Attending: Oncology | Admitting: Oncology

## 2017-02-07 ENCOUNTER — Other Ambulatory Visit (HOSPITAL_COMMUNITY): Payer: Medicare Other

## 2017-02-07 ENCOUNTER — Encounter (HOSPITAL_COMMUNITY): Payer: Self-pay | Admitting: Oncology

## 2017-02-07 VITALS — BP 132/62 | HR 89 | Resp 16 | Ht 65.0 in | Wt 150.0 lb

## 2017-02-07 DIAGNOSIS — Z952 Presence of prosthetic heart valve: Secondary | ICD-10-CM | POA: Insufficient documentation

## 2017-02-07 DIAGNOSIS — E079 Disorder of thyroid, unspecified: Secondary | ICD-10-CM | POA: Insufficient documentation

## 2017-02-07 DIAGNOSIS — K589 Irritable bowel syndrome without diarrhea: Secondary | ICD-10-CM | POA: Insufficient documentation

## 2017-02-07 DIAGNOSIS — R42 Dizziness and giddiness: Secondary | ICD-10-CM | POA: Insufficient documentation

## 2017-02-07 DIAGNOSIS — Z87891 Personal history of nicotine dependence: Secondary | ICD-10-CM | POA: Insufficient documentation

## 2017-02-07 DIAGNOSIS — H5462 Unqualified visual loss, left eye, normal vision right eye: Secondary | ICD-10-CM | POA: Insufficient documentation

## 2017-02-07 DIAGNOSIS — D473 Essential (hemorrhagic) thrombocythemia: Secondary | ICD-10-CM | POA: Diagnosis not present

## 2017-02-07 DIAGNOSIS — E785 Hyperlipidemia, unspecified: Secondary | ICD-10-CM | POA: Insufficient documentation

## 2017-02-07 DIAGNOSIS — K625 Hemorrhage of anus and rectum: Secondary | ICD-10-CM | POA: Insufficient documentation

## 2017-02-07 DIAGNOSIS — F329 Major depressive disorder, single episode, unspecified: Secondary | ICD-10-CM | POA: Insufficient documentation

## 2017-02-07 DIAGNOSIS — Z9889 Other specified postprocedural states: Secondary | ICD-10-CM | POA: Insufficient documentation

## 2017-02-07 DIAGNOSIS — Z7901 Long term (current) use of anticoagulants: Secondary | ICD-10-CM | POA: Insufficient documentation

## 2017-02-07 DIAGNOSIS — Z90710 Acquired absence of both cervix and uterus: Secondary | ICD-10-CM | POA: Insufficient documentation

## 2017-02-07 DIAGNOSIS — D75839 Thrombocytosis, unspecified: Secondary | ICD-10-CM

## 2017-02-07 DIAGNOSIS — R531 Weakness: Secondary | ICD-10-CM | POA: Insufficient documentation

## 2017-02-07 DIAGNOSIS — I10 Essential (primary) hypertension: Secondary | ICD-10-CM | POA: Insufficient documentation

## 2017-02-07 DIAGNOSIS — Z8249 Family history of ischemic heart disease and other diseases of the circulatory system: Secondary | ICD-10-CM | POA: Insufficient documentation

## 2017-02-07 DIAGNOSIS — Z79899 Other long term (current) drug therapy: Secondary | ICD-10-CM | POA: Insufficient documentation
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
## 2017-02-09 ENCOUNTER — Ambulatory Visit: Payer: Medicare Other | Admitting: Cardiology

## 2017-02-13 DIAGNOSIS — R5383 Other fatigue: Secondary | ICD-10-CM | POA: Diagnosis not present

## 2017-02-13 DIAGNOSIS — K819 Cholecystitis, unspecified: Secondary | ICD-10-CM | POA: Diagnosis not present

## 2017-02-14 ENCOUNTER — Ambulatory Visit (INDEPENDENT_AMBULATORY_CARE_PROVIDER_SITE_OTHER): Payer: Medicare Other | Admitting: Diagnostic Neuroimaging

## 2017-02-14 DIAGNOSIS — R200 Anesthesia of skin: Secondary | ICD-10-CM

## 2017-02-14 DIAGNOSIS — R413 Other amnesia: Secondary | ICD-10-CM

## 2017-02-14 DIAGNOSIS — R519 Headache, unspecified: Secondary | ICD-10-CM

## 2017-02-14 DIAGNOSIS — R51 Headache: Principal | ICD-10-CM

## 2017-02-14 DIAGNOSIS — R410 Disorientation, unspecified: Secondary | ICD-10-CM

## 2017-02-16 ENCOUNTER — Ambulatory Visit (HOSPITAL_COMMUNITY)
Admission: RE | Admit: 2017-02-16 | Discharge: 2017-02-16 | Disposition: A | Discharge status: Home / Self Care | Payer: Medicare Other | Source: Ambulatory Visit | Attending: Diagnostic Neuroimaging | Admitting: Diagnostic Neuroimaging

## 2017-02-16 ENCOUNTER — Ambulatory Visit (HOSPITAL_COMMUNITY): Payer: Medicare Other

## 2017-02-16 DIAGNOSIS — R51 Headache: Secondary | ICD-10-CM | POA: Insufficient documentation

## 2017-02-16 DIAGNOSIS — R200 Anesthesia of skin: Secondary | ICD-10-CM | POA: Diagnosis not present

## 2017-02-16 DIAGNOSIS — R252 Cramp and spasm: Secondary | ICD-10-CM | POA: Diagnosis not present

## 2017-02-16 DIAGNOSIS — M542 Cervicalgia: Secondary | ICD-10-CM | POA: Diagnosis not present

## 2017-02-16 DIAGNOSIS — Z8673 Personal history of transient ischemic attack (TIA), and cerebral infarction without residual deficits: Secondary | ICD-10-CM | POA: Insufficient documentation

## 2017-02-16 DIAGNOSIS — G319 Degenerative disease of nervous system, unspecified: Secondary | ICD-10-CM | POA: Diagnosis not present

## 2017-02-16 DIAGNOSIS — R413 Other amnesia: Secondary | ICD-10-CM | POA: Diagnosis not present

## 2017-02-16 DIAGNOSIS — R519 Headache, unspecified: Secondary | ICD-10-CM

## 2017-02-16 DIAGNOSIS — G959 Disease of spinal cord, unspecified: Secondary | ICD-10-CM | POA: Diagnosis not present

## 2017-02-16 DIAGNOSIS — M4802 Spinal stenosis, cervical region: Secondary | ICD-10-CM | POA: Insufficient documentation

## 2017-02-16 DIAGNOSIS — M47812 Spondylosis without myelopathy or radiculopathy, cervical region: Secondary | ICD-10-CM | POA: Insufficient documentation

## 2017-02-16 MED ORDER — GADOBENATE DIMEGLUMINE 529 MG/ML IV SOLN
13.0000 mL | Freq: Once | INTRAVENOUS | Status: AC | PRN
  Administered 2017-02-16: 13 mL via INTRAVENOUS

## 2017-02-19 NOTE — Procedures (Signed)
   [facility]  EEG (ELECTROENCEPHALOGRAM) REPORT   STUDY DATE: 02/14/17 PATIENT NAME: Ana [name] DOB: 1945/06/13 MRN: [id number]  ORDERING CLINICIAN: Andrey Spearman, MD   TECHNOLOGIST: Oneita Jolly  TECHNIQUE: Electroencephalogram was recorded utilizing standard 10-20 system of lead placement and reformatted into average and bipolar montages.  RECORDING TIME: 20 minutes  ACTIVATION: hyperventilation and photic stimulation  CLINICAL INFORMATION: 71 year old female with seizures.  FINDINGS: Posterior dominant background rhythms, which attenuate with eye opening, ranging 8-9 hertz and 50-60 microvolts. No focal, lateralizing, epileptiform activity or seizures are seen. Patient recorded in the awake and drowsy state. EKG channel shows regular rhythm of 70-80 beats per minute.   IMPRESSION:   Normal EEG in the awake and drowsy states.     INTERPRETING PHYSICIAN:  Penni Bombard, MD Certified in Neurology, Neurophysiology and Neuroimaging  [facility] [address] [place], [place] [address] [phone]

## 2017-02-21 DIAGNOSIS — Z952 Presence of prosthetic heart valve: Secondary | ICD-10-CM | POA: Diagnosis not present

## 2017-02-21 DIAGNOSIS — D473 Essential (hemorrhagic) thrombocythemia: Secondary | ICD-10-CM | POA: Diagnosis not present

## 2017-02-21 DIAGNOSIS — L74513 Primary focal hyperhidrosis, soles: Secondary | ICD-10-CM | POA: Diagnosis not present

## 2017-02-21 DIAGNOSIS — R791 Abnormal coagulation profile: Secondary | ICD-10-CM | POA: Diagnosis not present

## 2017-02-21 DIAGNOSIS — M25551 Pain in right hip: Secondary | ICD-10-CM | POA: Diagnosis not present

## 2017-02-21 DIAGNOSIS — I482 Chronic atrial fibrillation: Secondary | ICD-10-CM | POA: Diagnosis not present

## 2017-02-21 DIAGNOSIS — R5383 Other fatigue: Secondary | ICD-10-CM | POA: Diagnosis not present

## 2017-02-21 DIAGNOSIS — H9319 Tinnitus, unspecified ear: Secondary | ICD-10-CM | POA: Diagnosis not present

## 2017-02-21 DIAGNOSIS — H8149 Vertigo of central origin, unspecified ear: Secondary | ICD-10-CM | POA: Diagnosis not present

## 2017-02-21 DIAGNOSIS — H53149 Visual discomfort, unspecified: Secondary | ICD-10-CM | POA: Diagnosis not present

## 2017-02-22 ENCOUNTER — Telehealth: Payer: Self-pay

## 2017-02-22 NOTE — Telephone Encounter (Signed)
-----   Message from Penni Bombard, MD sent at 02/21/2017  6:28 PM EDT ----- Normal EEG. Please call patient. Continue current plan. -VRP

## 2017-02-22 NOTE — Telephone Encounter (Signed)
I left a detailed message with normal EEG results, ok per dpr. I advised patient to call back with any questions or concerns.

## 2017-02-26 DIAGNOSIS — A6920 Lyme disease, unspecified: Secondary | ICD-10-CM | POA: Diagnosis not present

## 2017-02-26 DIAGNOSIS — Z7901 Long term (current) use of anticoagulants: Secondary | ICD-10-CM | POA: Diagnosis not present

## 2017-02-26 DIAGNOSIS — Z8679 Personal history of other diseases of the circulatory system: Secondary | ICD-10-CM | POA: Diagnosis not present

## 2017-02-26 DIAGNOSIS — R5382 Chronic fatigue, unspecified: Secondary | ICD-10-CM | POA: Diagnosis not present

## 2017-03-06 ENCOUNTER — Telehealth: Payer: Self-pay | Admitting: Diagnostic Neuroimaging

## 2017-03-06 NOTE — Telephone Encounter (Signed)
Patient called and LM on VM  She had an EEG and an MRI and has not heard back with the results. She would like a call back and a copy of results sent to her dg

## 2017-03-07 NOTE — Telephone Encounter (Signed)
LVM informing patient that her MRI brain results showed no new or acute or major findings. Advised there are some older small strokes and arthritis in her neck. Informed her that her EEG results were left in VM but informed her that EEG was normal. Left number for any questions.

## 2017-03-20 ENCOUNTER — Encounter: Payer: Self-pay | Admitting: Cardiology

## 2017-03-20 ENCOUNTER — Ambulatory Visit (INDEPENDENT_AMBULATORY_CARE_PROVIDER_SITE_OTHER): Payer: Medicare Other | Admitting: Cardiology

## 2017-03-20 VITALS — BP 148/74 | HR 84 | Ht 65.0 in | Wt 153.0 lb

## 2017-03-20 DIAGNOSIS — E782 Mixed hyperlipidemia: Secondary | ICD-10-CM

## 2017-03-20 DIAGNOSIS — R0789 Other chest pain: Secondary | ICD-10-CM

## 2017-03-20 DIAGNOSIS — I351 Nonrheumatic aortic (valve) insufficiency: Secondary | ICD-10-CM

## 2017-03-20 DIAGNOSIS — I10 Essential (primary) hypertension: Secondary | ICD-10-CM

## 2017-03-20 NOTE — Patient Instructions (Addendum)
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
## 2017-03-21 ENCOUNTER — Encounter: Payer: Self-pay | Admitting: Cardiology

## 2017-03-27 DIAGNOSIS — L723 Sebaceous cyst: Secondary | ICD-10-CM | POA: Diagnosis not present

## 2017-03-27 DIAGNOSIS — L570 Actinic keratosis: Secondary | ICD-10-CM | POA: Diagnosis not present

## 2017-04-04 ENCOUNTER — Ambulatory Visit (INDEPENDENT_AMBULATORY_CARE_PROVIDER_SITE_OTHER): Payer: Medicare Other | Admitting: Infectious Diseases

## 2017-04-04 ENCOUNTER — Encounter: Payer: Self-pay | Admitting: Infectious Diseases

## 2017-04-04 VITALS — BP 176/83 | HR 80 | Temp 98.4°F | Wt 153.0 lb

## 2017-04-04 DIAGNOSIS — R748 Abnormal levels of other serum enzymes: Secondary | ICD-10-CM | POA: Diagnosis not present

## 2017-04-04 DIAGNOSIS — R5382 Chronic fatigue, unspecified: Secondary | ICD-10-CM

## 2017-04-04 DIAGNOSIS — G9332 Myalgic encephalomyelitis/chronic fatigue syndrome: Secondary | ICD-10-CM

## 2017-04-04 NOTE — Patient Instructions (Signed)
[email or web address]  Will discuss with my physician colleagues and call you early next week
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
## 2017-04-11 DIAGNOSIS — R7309 Other abnormal glucose: Secondary | ICD-10-CM | POA: Diagnosis not present

## 2017-04-11 DIAGNOSIS — E042 Nontoxic multinodular goiter: Secondary | ICD-10-CM | POA: Diagnosis not present

## 2017-04-11 DIAGNOSIS — I10 Essential (primary) hypertension: Secondary | ICD-10-CM | POA: Diagnosis not present

## 2017-04-11 DIAGNOSIS — R5381 Other malaise: Secondary | ICD-10-CM | POA: Diagnosis not present

## 2017-04-11 NOTE — Assessment & Plan Note (Addendum)
In reviewing Ana [name]'s symptoms I have a strong feeling that many of her symptoms are due to other causes. She does not have symptoms consistent with tick borne disease or relapsing fever which is the PCR she was positive for on commercial lab test. And of note she was IgM (+) - being she has had symptoms ongoing for > 2 years now I am fairly certain this was a false positive result likely due to an underlying illness not yet discovered.   Upon further review [facility] is a non-FDA and non-CDC approved lab that utilizes many testing procedures (including urine PCR and culture) that are not advised per IDSA guidelines when it comes to Lyme disease diagnosis. I discussed with her concerns I had over this company, false-positive results leading to over- and misdiagnosis of other offending conditions. She tells me that I have wasted her time today however I believe it is important as she is her own health advocate to have all information regarding so she can make her own determination as to how she will proceed.   She would have no benefit from chronic antibiotic therapy at this point and it would not recommended. I unfortunately do not have the knowledge behind some of the alternative therapies she has pursued to recommend or encourage her to continue with them. I have no doubt that something is going on causing her symptoms and unfortunately today I was not able to examine her due to time constraints being that a majority of my time was dedicated to discussion over her testing and ROS. I have encouraged her to work closely with her PCP for other causes of her symptoms (some of which I believe can be explained due to her co-morbid conditions and medications required like her bleeding gums and melena d/t her coumadin for example.) I have reviewed this case with my physician colleagues and we all have the same concerns regarding the validity of her testing.

## 2017-04-16 DIAGNOSIS — R748 Abnormal levels of other serum enzymes: Secondary | ICD-10-CM | POA: Insufficient documentation

## 2017-04-16 DIAGNOSIS — R7989 Other specified abnormal findings of blood chemistry: Secondary | ICD-10-CM | POA: Insufficient documentation

## 2017-04-16 NOTE — Assessment & Plan Note (Signed)
Only thing remarkable aside from elevated platelet count is elevated vitamin b 12. She is currently being supplemented and likely needs this adjusted if possible. Uncertain if this is contributing to any of her symptoms.

## 2017-04-23 ENCOUNTER — Emergency Department (HOSPITAL_COMMUNITY): Payer: Medicare Other

## 2017-04-23 ENCOUNTER — Other Ambulatory Visit: Payer: Self-pay

## 2017-04-23 ENCOUNTER — Encounter (HOSPITAL_COMMUNITY): Payer: Self-pay | Admitting: Cardiology

## 2017-04-23 ENCOUNTER — Emergency Department (HOSPITAL_COMMUNITY)
Admission: EM | Admit: 2017-04-23 | Discharge: 2017-04-23 | Disposition: A | Discharge status: Home / Self Care | Payer: Medicare Other | Attending: Emergency Medicine | Admitting: Emergency Medicine

## 2017-04-23 DIAGNOSIS — R1011 Right upper quadrant pain: Secondary | ICD-10-CM

## 2017-04-23 DIAGNOSIS — K8051 Calculus of bile duct without cholangitis or cholecystitis with obstruction: Secondary | ICD-10-CM | POA: Insufficient documentation

## 2017-04-23 DIAGNOSIS — K8050 Calculus of bile duct without cholangitis or cholecystitis without obstruction: Secondary | ICD-10-CM | POA: Diagnosis not present

## 2017-04-23 DIAGNOSIS — I10 Essential (primary) hypertension: Secondary | ICD-10-CM | POA: Diagnosis not present

## 2017-04-23 DIAGNOSIS — R1013 Epigastric pain: Secondary | ICD-10-CM | POA: Diagnosis not present

## 2017-04-23 DIAGNOSIS — E079 Disorder of thyroid, unspecified: Secondary | ICD-10-CM | POA: Insufficient documentation

## 2017-04-23 DIAGNOSIS — Z87891 Personal history of nicotine dependence: Secondary | ICD-10-CM | POA: Insufficient documentation

## 2017-04-23 DIAGNOSIS — Z79899 Other long term (current) drug therapy: Secondary | ICD-10-CM | POA: Insufficient documentation

## 2017-04-23 DIAGNOSIS — K8020 Calculus of gallbladder without cholecystitis without obstruction: Secondary | ICD-10-CM | POA: Diagnosis not present

## 2017-04-23 LAB — COMPREHENSIVE METABOLIC PANEL
ALBUMIN: 4.9 g/dL (ref 3.5–5.0)
ALK PHOS: 93 U/L (ref 38–126)
ALT: 48 U/L (ref 14–54)
AST: 38 U/L (ref 15–41)
Anion gap: 8 (ref 5–15)
BILIRUBIN TOTAL: 0.6 mg/dL (ref 0.3–1.2)
BUN: 20 mg/dL (ref 6–20)
CALCIUM: 9.8 mg/dL (ref 8.9–10.3)
CO2: 28 mmol/L (ref 22–32)
CREATININE: 0.68 mg/dL (ref 0.44–1.00)
Chloride: 105 mmol/L (ref 101–111)
GFR calc Af Amer: >60 mL/min (ref >60)
GFR calc non Af Amer: >60 mL/min (ref >60)
GLUCOSE: 158 mg/dL — AB (ref 65–99)
Potassium: 3.9 mmol/L (ref 3.5–5.1)
SODIUM: 141 mmol/L (ref 135–145)
TOTAL PROTEIN: 8.7 g/dL — AB (ref 6.5–8.1)

## 2017-04-23 LAB — URINALYSIS, ROUTINE W REFLEX MICROSCOPIC
Bilirubin Urine: NEGATIVE
GLUCOSE, UA: NEGATIVE mg/dL
Hgb urine dipstick: NEGATIVE
Ketones, ur: NEGATIVE mg/dL
Leukocytes, UA: NEGATIVE
NITRITE: NEGATIVE
PROTEIN: 30 mg/dL — AB
Specific Gravity, Urine: 1.021 (ref 1.005–1.030)
pH: 5 (ref 5.0–8.0)

## 2017-04-23 LAB — CBC
HCT: 48.4 % — ABNORMAL HIGH (ref 36.0–46.0)
HEMOGLOBIN: 16 g/dL — AB (ref 12.0–15.0)
MCH: 30.9 pg (ref 26.0–34.0)
MCHC: 33.1 g/dL (ref 30.0–36.0)
MCV: 93.4 fL (ref 78.0–100.0)
PLATELETS: 653 10*3/uL — AB (ref 150–400)
RBC: 5.18 MIL/uL — ABNORMAL HIGH (ref 3.87–5.11)
RDW: 13.8 % (ref 11.5–15.5)
WBC: 15.1 10*3/uL — ABNORMAL HIGH (ref 4.0–10.5)

## 2017-04-23 LAB — TROPONIN I: Troponin I: <0.03 ng/mL (ref <0.03)

## 2017-04-23 LAB — PROTIME-INR
INR: 2.48
Prothrombin Time: 26.6 seconds — ABNORMAL HIGH (ref 11.4–15.2)

## 2017-04-23 LAB — LIPASE, BLOOD: Lipase: 35 U/L (ref 11–51)

## 2017-04-23 MED ORDER — FENTANYL CITRATE (PF) 100 MCG/2ML IJ SOLN
50.0000 ug | Freq: Once | INTRAMUSCULAR | Status: AC
  Administered 2017-04-23: 50 ug via INTRAVENOUS
  Filled 2017-04-23: qty 2

## 2017-04-23 MED ORDER — AMOXICILLIN-POT CLAVULANATE 875-125 MG PO TABS: 1.0000 | ORAL_TABLET | Freq: Two times a day (BID) | ORAL | 0 refills | Status: DC

## 2017-04-23 MED ORDER — HYDROCODONE-ACETAMINOPHEN 5-325 MG PO TABS: 1.0000 | ORAL_TABLET | ORAL | 0 refills | Status: DC | PRN

## 2017-04-23 MED ORDER — ONDANSETRON HCL 4 MG/2ML IJ SOLN
4.0000 mg | Freq: Once | INTRAMUSCULAR | Status: AC
  Administered 2017-04-23: 4 mg via INTRAVENOUS
  Filled 2017-04-23: qty 2

## 2017-04-23 MED ORDER — ONDANSETRON 4 MG PO TBDP: 4.0000 mg | ORAL_TABLET | Freq: Three times a day (TID) | ORAL | 0 refills | Status: DC | PRN

## 2017-04-23 MED ORDER — HYDROCODONE-ACETAMINOPHEN 5-325 MG PO TABS
1.0000 | ORAL_TABLET | Freq: Once | ORAL | Status: AC
  Administered 2017-04-23: 1 via ORAL
  Filled 2017-04-23: qty 1

## 2017-04-23 MED ORDER — AMOXICILLIN-POT CLAVULANATE 875-125 MG PO TABS
1.0000 | ORAL_TABLET | Freq: Once | ORAL | Status: AC
  Administered 2017-04-23: 1 via ORAL
  Filled 2017-04-23: qty 1

## 2017-04-23 MED ORDER — IOPAMIDOL (ISOVUE-300) INJECTION 61%
100.0000 mL | Freq: Once | INTRAVENOUS | Status: AC | PRN
  Administered 2017-04-23: 100 mL via INTRAVENOUS

## 2017-04-23 MED ORDER — GI COCKTAIL ~~LOC~~
30.0000 mL | Freq: Once | ORAL | Status: AC
  Administered 2017-04-23: 30 mL via ORAL
  Filled 2017-04-23: qty 30

## 2017-04-23 NOTE — Discharge Instructions (Signed)
Take the antibiotics as prescribed.  Avoid fatty and greasy foods.  Follow-up with Dr. Harl Bowie to have a stress test because you need this before the surgeon can take her gallbladder out.  Follow-up with Dr. Constance Haw to get scheduled to have her gallbladder taken out.  Return to the ED if you have worsening pain, vomiting or fever or any other concerns.
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
## 2017-04-23 NOTE — ED Notes (Signed)
Patient transported to CT 

## 2017-04-23 NOTE — ED Notes (Signed)
Fluid Challenge Complete. Pt tolerated well.

## 2017-04-23 NOTE — ED Triage Notes (Signed)
RUQ abdominal pain since 1030.  Also c/o nausea.

## 2017-04-25 ENCOUNTER — Telehealth: Payer: Self-pay | Admitting: Cardiology

## 2017-04-25 ENCOUNTER — Other Ambulatory Visit: Payer: Self-pay

## 2017-04-25 NOTE — Telephone Encounter (Signed)
Please call pt concerning stress test for surgical clearance

## 2017-04-25 NOTE — Patient Outreach (Signed)
[facility]) Care Management  04/25/2017  Ana [name] July 24, 1945 [id number]   Telephone Screen  Referral Date: 04/25/17 Referral Source: ED Census Report Referral Reason: Insurance: Medicare   Outreach attempt # 1 to patient. Spoke with patient. Patient did not wish to speak at length. She voices her frustrations regarding her medical conditions. She voices that she has been suffering from "[facility] Spotted fever and acute lyme disease for almost two years." She states she needs her gallbladder taken out but before that she must pass a stress test. She voices she knows she will not pass the test and therefore will "have to suffer until death." RN CM tried to provide some support and reassurance to patient. She did not seem interested but just wanted to vent. She voices she tried calling cardiology office but office was closed due to weather. She will try again tomorrow. She states that there is no need to call and make an appt with surgeon as she knows she will not qualify for surgery. She is considering looking into getting second opinions from other MDs. Patient not interested in completing screening call. Patient ended call.    Plan: RN CM will notify [facility] administrative assistant of case status.   Enzo Montgomery, RN,BSN,CCM [facility] Telephonic Care Management Coordinator Direct Phone: [phone] Toll Free: [phone] Fax: [phone]

## 2017-04-26 MED FILL — Hydrocodone-Acetaminophen Tab 5-325 MG: ORAL | Qty: 6 | Status: AC

## 2017-04-26 NOTE — Telephone Encounter (Signed)
Returned pt phone call. No answer/ Left message for pt to return call.

## 2017-04-26 NOTE — Telephone Encounter (Signed)
Spoke with patient, she states that she was in the hospital recently with a gallbladder attack. She was advised that she would need a stress test before she could proceed with the surgery. What kind of stress test needs to be ordered and do you need to see her in the office before scheduling? Please advise.

## 2017-04-27 NOTE — Telephone Encounter (Signed)
Needs a lexiscan for chest pain, hold norvasc day of. Do not need to see before, we had discussed this may happen at our next visit.    J Gael Londo MD

## 2017-04-30 ENCOUNTER — Telehealth: Payer: Self-pay

## 2017-04-30 DIAGNOSIS — Z01818 Encounter for other preprocedural examination: Secondary | ICD-10-CM

## 2017-04-30 NOTE — Telephone Encounter (Signed)
Scheduled lexi for pre op. Will contact pt. Pt will need to hold norvasc morning of test.

## 2017-04-30 NOTE — Telephone Encounter (Signed)
Called pt. Left message for pt to return call. Placed order for lexi. Sent front office a message to schedule.

## 2017-05-01 NOTE — Telephone Encounter (Signed)
Spoke with pt. she is aware of stress test appointment. She voiced understanding.

## 2017-05-14 ENCOUNTER — Ambulatory Visit (HOSPITAL_COMMUNITY): Payer: Medicare Other

## 2017-05-14 ENCOUNTER — Encounter (HOSPITAL_COMMUNITY): Payer: Medicare Other

## 2017-05-29 DIAGNOSIS — Z7901 Long term (current) use of anticoagulants: Secondary | ICD-10-CM | POA: Diagnosis not present

## 2017-05-29 DIAGNOSIS — I482 Chronic atrial fibrillation: Secondary | ICD-10-CM | POA: Diagnosis not present

## 2017-05-29 DIAGNOSIS — Z6823 Body mass index (BMI) 23.0-23.9, adult: Secondary | ICD-10-CM | POA: Diagnosis not present

## 2017-05-29 DIAGNOSIS — A6920 Lyme disease, unspecified: Secondary | ICD-10-CM | POA: Diagnosis not present

## 2017-06-12 ENCOUNTER — Ambulatory Visit: Payer: Medicare Other | Admitting: Diagnostic Neuroimaging

## 2017-07-27 DIAGNOSIS — Z8679 Personal history of other diseases of the circulatory system: Secondary | ICD-10-CM | POA: Diagnosis not present

## 2017-07-27 DIAGNOSIS — I482 Chronic atrial fibrillation: Secondary | ICD-10-CM | POA: Diagnosis not present

## 2017-07-27 DIAGNOSIS — D473 Essential (hemorrhagic) thrombocythemia: Secondary | ICD-10-CM | POA: Diagnosis not present

## 2017-07-27 DIAGNOSIS — I10 Essential (primary) hypertension: Secondary | ICD-10-CM | POA: Diagnosis not present

## 2017-07-27 DIAGNOSIS — S0570XA Avulsion of unspecified eye, initial encounter: Secondary | ICD-10-CM | POA: Diagnosis not present

## 2017-07-27 DIAGNOSIS — Z6823 Body mass index (BMI) 23.0-23.9, adult: Secondary | ICD-10-CM | POA: Diagnosis not present

## 2017-07-27 DIAGNOSIS — Z954 Presence of other heart-valve replacement: Secondary | ICD-10-CM | POA: Diagnosis not present

## 2017-07-27 DIAGNOSIS — D696 Thrombocytopenia, unspecified: Secondary | ICD-10-CM | POA: Diagnosis not present

## 2017-07-27 DIAGNOSIS — Z7901 Long term (current) use of anticoagulants: Secondary | ICD-10-CM | POA: Diagnosis not present

## 2017-08-07 ENCOUNTER — Encounter (INDEPENDENT_AMBULATORY_CARE_PROVIDER_SITE_OTHER): Payer: Self-pay

## 2017-08-07 ENCOUNTER — Encounter: Payer: Self-pay | Admitting: Diagnostic Neuroimaging

## 2017-08-07 ENCOUNTER — Ambulatory Visit (INDEPENDENT_AMBULATORY_CARE_PROVIDER_SITE_OTHER): Payer: Medicare Other | Admitting: Diagnostic Neuroimaging

## 2017-08-07 VITALS — BP 164/88 | HR 75 | Ht 65.0 in | Wt 150.6 lb

## 2017-08-07 DIAGNOSIS — R200 Anesthesia of skin: Secondary | ICD-10-CM

## 2017-08-07 DIAGNOSIS — R413 Other amnesia: Secondary | ICD-10-CM

## 2017-08-07 DIAGNOSIS — R252 Cramp and spasm: Secondary | ICD-10-CM
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
## 2017-08-17 DIAGNOSIS — R5381 Other malaise: Secondary | ICD-10-CM | POA: Diagnosis not present

## 2017-08-17 DIAGNOSIS — N951 Menopausal and female climacteric states: Secondary | ICD-10-CM | POA: Diagnosis not present

## 2017-08-23 ENCOUNTER — Telehealth: Payer: Self-pay | Admitting: *Deleted

## 2017-08-23 NOTE — Telephone Encounter (Signed)
LVM requesting call back re: MRI questions.

## 2017-08-24 NOTE — Telephone Encounter (Signed)
LVM informing patient that Dr Leta Baptist wasn't sure what she is referring to about "clutters" on her brain. Advised her that per Dr Leta Baptist she has remote (old) small strokes  in one part of her brain. No acute or major findings. Advised her the office is closed, opens Mon at 8 am. Left number and informed her there is a dr on call. Advised she call Monday for any further questions.

## 2017-11-12 ENCOUNTER — Other Ambulatory Visit (HOSPITAL_COMMUNITY): Payer: Self-pay | Admitting: Family Medicine

## 2017-11-12 DIAGNOSIS — I10 Essential (primary) hypertension: Secondary | ICD-10-CM | POA: Diagnosis not present

## 2017-11-12 DIAGNOSIS — E041 Nontoxic single thyroid nodule: Secondary | ICD-10-CM

## 2017-11-12 DIAGNOSIS — N959 Unspecified menopausal and perimenopausal disorder: Secondary | ICD-10-CM | POA: Diagnosis not present

## 2017-11-12 DIAGNOSIS — E039 Hypothyroidism, unspecified: Secondary | ICD-10-CM | POA: Diagnosis not present

## 2017-11-12 DIAGNOSIS — R5383 Other fatigue: Secondary | ICD-10-CM | POA: Diagnosis not present

## 2017-11-16 ENCOUNTER — Ambulatory Visit (HOSPITAL_COMMUNITY)
Admission: RE | Admit: 2017-11-16 | Discharge: 2017-11-16 | Disposition: A | Discharge status: Home / Self Care | Payer: Medicare Other | Source: Ambulatory Visit | Attending: Family Medicine | Admitting: Family Medicine

## 2017-11-16 DIAGNOSIS — E041 Nontoxic single thyroid nodule: Secondary | ICD-10-CM | POA: Insufficient documentation

## 2018-01-15 DIAGNOSIS — I10 Essential (primary) hypertension: Secondary | ICD-10-CM | POA: Diagnosis not present

## 2018-01-15 DIAGNOSIS — A6920 Lyme disease, unspecified: Secondary | ICD-10-CM | POA: Diagnosis not present

## 2018-01-15 DIAGNOSIS — E782 Mixed hyperlipidemia: Secondary | ICD-10-CM | POA: Diagnosis not present

## 2018-01-15 DIAGNOSIS — Z7901 Long term (current) use of anticoagulants: Secondary | ICD-10-CM | POA: Diagnosis not present

## 2018-01-15 DIAGNOSIS — Z8679 Personal history of other diseases of the circulatory system: Secondary | ICD-10-CM | POA: Diagnosis not present

## 2018-01-15 DIAGNOSIS — H5440 Blindness, one eye, unspecified eye: Secondary | ICD-10-CM | POA: Diagnosis not present

## 2018-01-15 DIAGNOSIS — Z6823 Body mass index (BMI) 23.0-23.9, adult: Secondary | ICD-10-CM | POA: Diagnosis not present

## 2018-01-15 DIAGNOSIS — D696 Thrombocytopenia, unspecified: Secondary | ICD-10-CM | POA: Diagnosis not present

## 2018-01-15 DIAGNOSIS — S0570XA Avulsion of unspecified eye, initial encounter: Secondary | ICD-10-CM | POA: Diagnosis not present

## 2018-01-15 DIAGNOSIS — I482 Chronic atrial fibrillation: Secondary | ICD-10-CM | POA: Diagnosis not present

## 2018-01-15 DIAGNOSIS — R7301 Impaired fasting glucose: Secondary | ICD-10-CM | POA: Diagnosis not present

## 2018-01-15 DIAGNOSIS — Z954 Presence of other heart-valve replacement: Secondary | ICD-10-CM | POA: Diagnosis not present

## 2018-01-15 DIAGNOSIS — D473 Essential (hemorrhagic) thrombocythemia: Secondary | ICD-10-CM | POA: Diagnosis not present

## 2018-01-15 DIAGNOSIS — Z8639 Personal history of other endocrine, nutritional and metabolic disease: Secondary | ICD-10-CM | POA: Diagnosis not present

## 2018-02-04 DIAGNOSIS — M797 Fibromyalgia: Secondary | ICD-10-CM | POA: Diagnosis not present

## 2018-02-04 DIAGNOSIS — E049 Nontoxic goiter, unspecified: Secondary | ICD-10-CM | POA: Diagnosis not present

## 2018-02-04 DIAGNOSIS — R5381 Other malaise: Secondary | ICD-10-CM | POA: Diagnosis not present

## 2018-02-04 DIAGNOSIS — R634 Abnormal weight loss: Secondary | ICD-10-CM | POA: Diagnosis not present

## 2018-02-04 DIAGNOSIS — E063 Autoimmune thyroiditis: Secondary | ICD-10-CM | POA: Diagnosis not present

## 2018-02-04 DIAGNOSIS — E119 Type 2 diabetes mellitus without complications: Secondary | ICD-10-CM | POA: Diagnosis not present

## 2018-02-04 DIAGNOSIS — I10 Essential (primary) hypertension: Secondary | ICD-10-CM | POA: Diagnosis not present

## 2018-03-01 DIAGNOSIS — I482 Chronic atrial fibrillation, unspecified: Secondary | ICD-10-CM | POA: Diagnosis not present

## 2018-03-01 DIAGNOSIS — Z954 Presence of other heart-valve replacement: Secondary | ICD-10-CM | POA: Diagnosis not present

## 2018-03-01 DIAGNOSIS — Z7901 Long term (current) use of anticoagulants: Secondary | ICD-10-CM | POA: Diagnosis not present

## 2018-03-13 DIAGNOSIS — Z7901 Long term (current) use of anticoagulants: Secondary | ICD-10-CM | POA: Diagnosis not present

## 2018-03-13 DIAGNOSIS — I482 Chronic atrial fibrillation, unspecified: Secondary | ICD-10-CM | POA: Diagnosis not present

## 2018-03-13 DIAGNOSIS — Z954 Presence of other heart-valve replacement: Secondary | ICD-10-CM | POA: Diagnosis not present

## 2018-03-13 DIAGNOSIS — Z0000 Encounter for general adult medical examination without abnormal findings: Secondary | ICD-10-CM | POA: Diagnosis not present

## 2018-03-27 ENCOUNTER — Ambulatory Visit: Payer: Medicare Other | Admitting: Cardiology

## 2018-04-30 ENCOUNTER — Other Ambulatory Visit (HOSPITAL_COMMUNITY): Payer: Self-pay | Admitting: Family Medicine

## 2018-04-30 DIAGNOSIS — E042 Nontoxic multinodular goiter: Secondary | ICD-10-CM

## 2018-05-06 DIAGNOSIS — E049 Nontoxic goiter, unspecified: Secondary | ICD-10-CM | POA: Diagnosis not present

## 2018-05-06 DIAGNOSIS — E063 Autoimmune thyroiditis: Secondary | ICD-10-CM | POA: Diagnosis not present

## 2018-05-06 DIAGNOSIS — I10 Essential (primary) hypertension: Secondary | ICD-10-CM | POA: Diagnosis not present

## 2018-05-06 DIAGNOSIS — R7309 Other abnormal glucose: Secondary | ICD-10-CM | POA: Diagnosis not present

## 2018-05-06 DIAGNOSIS — R5381 Other malaise: Secondary | ICD-10-CM | POA: Diagnosis not present

## 2018-05-07 ENCOUNTER — Ambulatory Visit (HOSPITAL_COMMUNITY)
Admission: RE | Admit: 2018-05-07 | Discharge: 2018-05-07 | Disposition: A | Discharge status: Home / Self Care | Payer: Medicare Other | Source: Ambulatory Visit | Attending: Family Medicine | Admitting: Family Medicine

## 2018-05-07 ENCOUNTER — Ambulatory Visit (HOSPITAL_COMMUNITY): Admission: RE | Admit: 2018-05-07 | Payer: 59 | Source: Ambulatory Visit

## 2018-05-07 DIAGNOSIS — E042 Nontoxic multinodular goiter: Secondary | ICD-10-CM

## 2018-05-09 ENCOUNTER — Ambulatory Visit (INDEPENDENT_AMBULATORY_CARE_PROVIDER_SITE_OTHER): Payer: Medicare Other | Admitting: Cardiology

## 2018-05-09 ENCOUNTER — Encounter: Payer: Self-pay | Admitting: Cardiology

## 2018-05-09 ENCOUNTER — Encounter: Payer: Self-pay | Admitting: *Deleted

## 2018-05-09 VITALS — BP 154/74 | HR 81 | Ht 64.0 in | Wt 143.0 lb

## 2018-05-09 DIAGNOSIS — I351 Nonrheumatic aortic (valve) insufficiency: Secondary | ICD-10-CM | POA: Diagnosis not present

## 2018-05-09 DIAGNOSIS — I10 Essential (primary) hypertension: Secondary | ICD-10-CM

## 2018-05-09 MED ORDER — AMOXICILLIN 500 MG PO CAPS: 2000.0000 mg | ORAL_CAPSULE | Freq: Once | ORAL | 6 refills | Status: DC | PRN

## 2018-05-09 NOTE — Patient Instructions (Addendum)
Medication Instructions:   Amoxicillin 2g - to be taken 1 hour prior to dental procedures.   Continue all other medications.    Labwork: none  Testing/Procedures: none  Follow-Up: Your physician wants you to follow up in:  1 year.  You will receive a reminder letter in the mail one-two months in advance.  If you don't receive a letter, please call our office to schedule the follow up appointment   Any Other Special Instructions Will Be Listed Below (If Applicable). DASH diet info sheet given today.   If you need a refill on your cardiac medications before your next appointment, please call your pharmacy.
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
## 2018-05-22 ENCOUNTER — Other Ambulatory Visit (HOSPITAL_COMMUNITY): Payer: 59

## 2018-07-31 DIAGNOSIS — N951 Menopausal and female climacteric states: Secondary | ICD-10-CM | POA: Diagnosis not present

## 2018-07-31 DIAGNOSIS — E049 Nontoxic goiter, unspecified: Secondary | ICD-10-CM | POA: Diagnosis not present

## 2018-07-31 DIAGNOSIS — R634 Abnormal weight loss: Secondary | ICD-10-CM | POA: Diagnosis not present

## 2018-07-31 DIAGNOSIS — R5381 Other malaise: Secondary | ICD-10-CM | POA: Diagnosis not present

## 2018-07-31 DIAGNOSIS — I10 Essential (primary) hypertension: Secondary | ICD-10-CM | POA: Diagnosis not present

## 2018-07-31 DIAGNOSIS — R7309 Other abnormal glucose: Secondary | ICD-10-CM | POA: Diagnosis not present

## 2018-09-11 DIAGNOSIS — Z7901 Long term (current) use of anticoagulants: Secondary | ICD-10-CM | POA: Diagnosis not present

## 2018-09-11 DIAGNOSIS — Z954 Presence of other heart-valve replacement: Secondary | ICD-10-CM | POA: Diagnosis not present

## 2018-10-22 DIAGNOSIS — Z954 Presence of other heart-valve replacement: Secondary | ICD-10-CM | POA: Diagnosis not present

## 2018-10-22 DIAGNOSIS — Z7901 Long term (current) use of anticoagulants: Secondary | ICD-10-CM | POA: Diagnosis not present

## 2018-10-22 DIAGNOSIS — I4891 Unspecified atrial fibrillation: Secondary | ICD-10-CM | POA: Diagnosis not present

## 2018-11-22 IMAGING — CT CT ABD-PELV W/ CM
2 of 5 series · 16 of 46 positions shown, 18 images · IV contrast (Isovue)
Comparison: Ultrasound from earlier in the same day.

CLINICAL DATA: Right upper quadrant pain for several hours

EXAM:
CT ABDOMEN AND PELVIS WITH CONTRAST
TECHNIQUE: Multidetector CT imaging of the abdomen and pelvis was performed
using the standard protocol following bolus administration of
intravenous contrast.
CONTRAST:  100mL [id number] IOPAMIDOL ([id number]) INJECTION 61%

[Series 2: axial st · axial · 0.76mm/px · z∈[+960,+1355]mm · 13 of 91 slices shown, 15 images]
[im 6/91  soft-tissue]
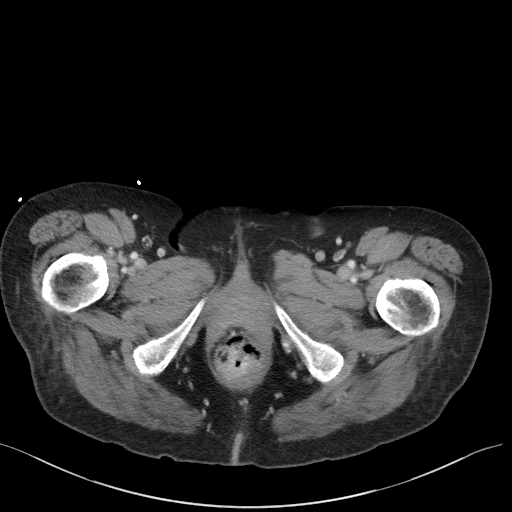
[im 6/91  bone]
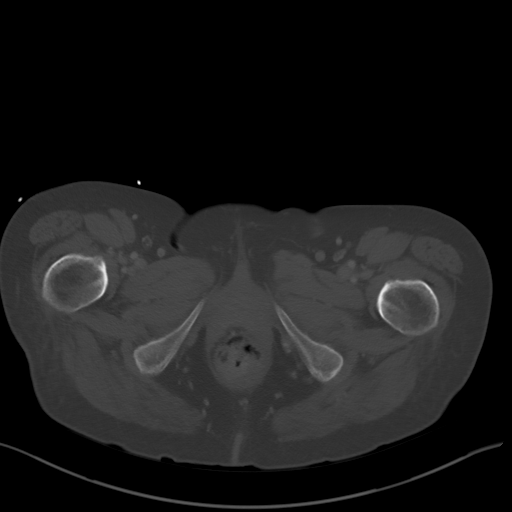
[im 12/91  soft-tissue]
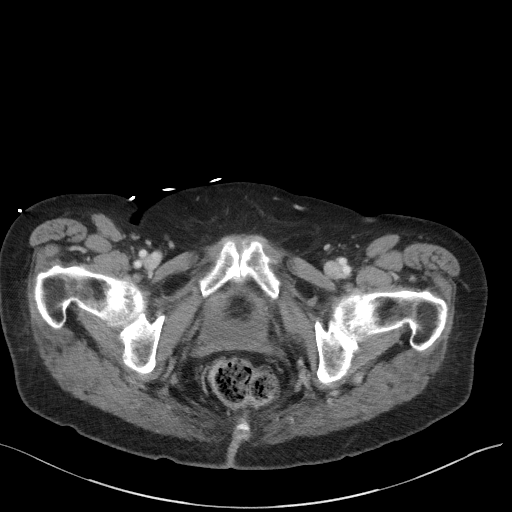
[im 17/91  soft-tissue]
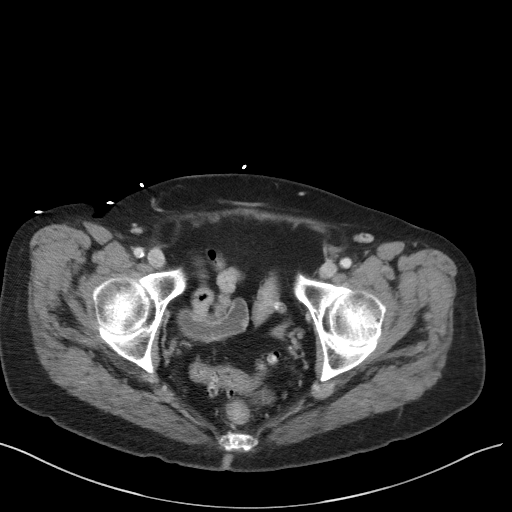
[im 29/91  soft-tissue]
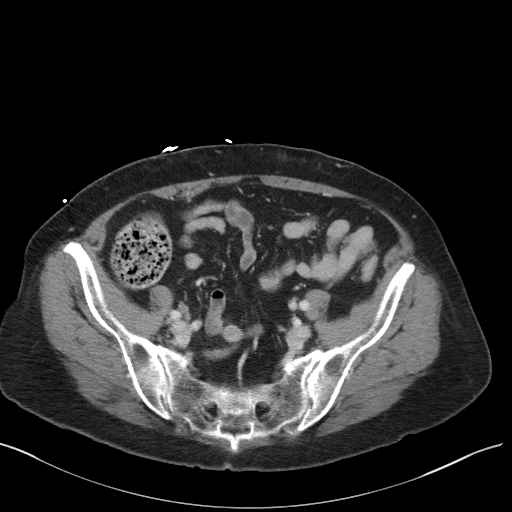
[im 34/91  soft-tissue]
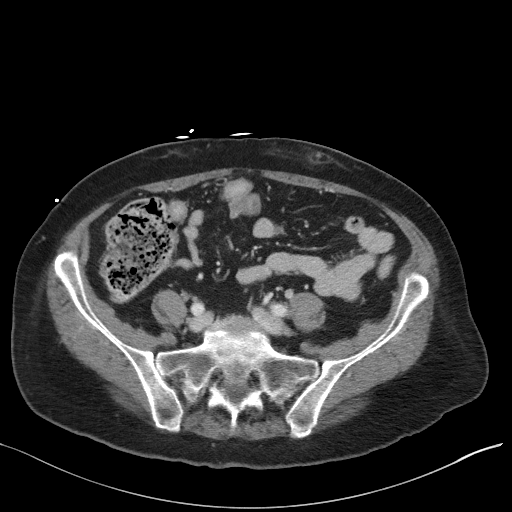
[im 40/91  soft-tissue]
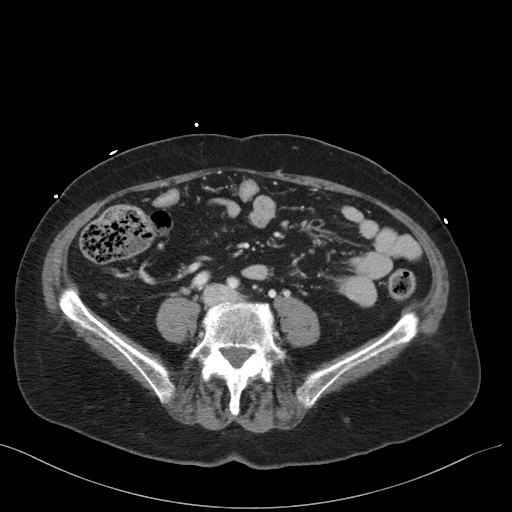
[im 46/91  soft-tissue]
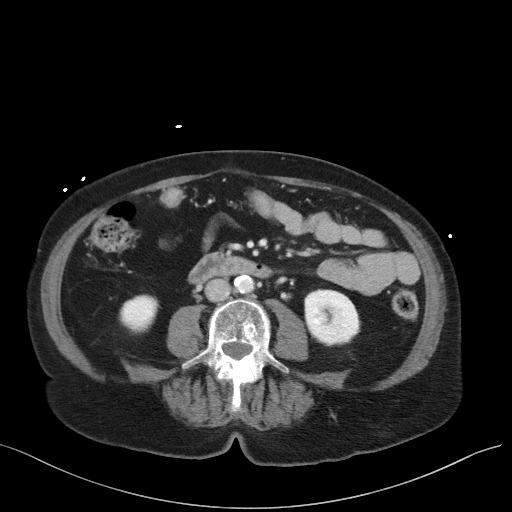
[im 51/91  soft-tissue]
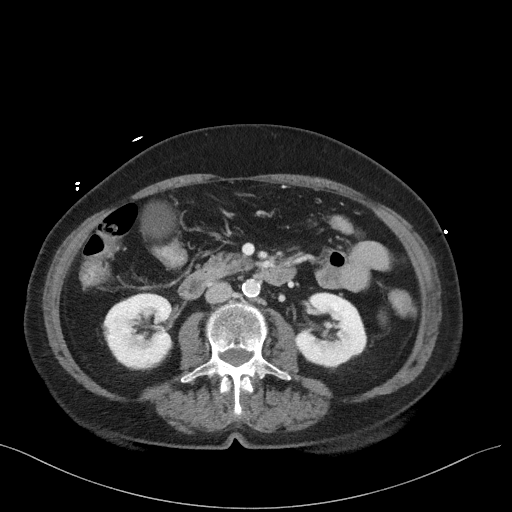
[im 57/91  soft-tissue]
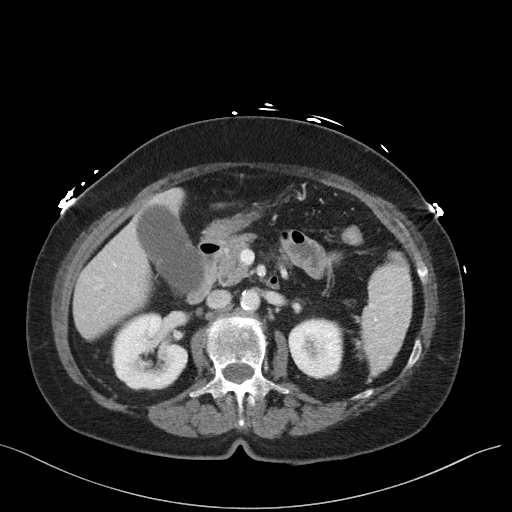
[im 57/91  bone]
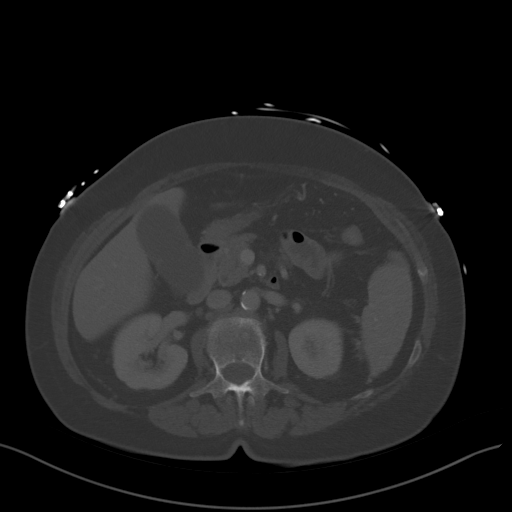
[im 62/91  soft-tissue]
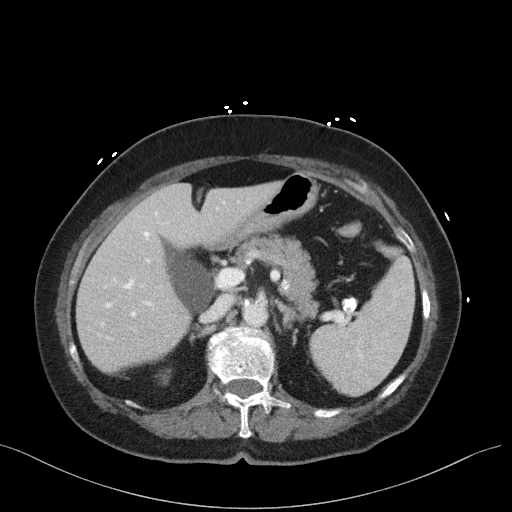
[im 74/91  soft-tissue]
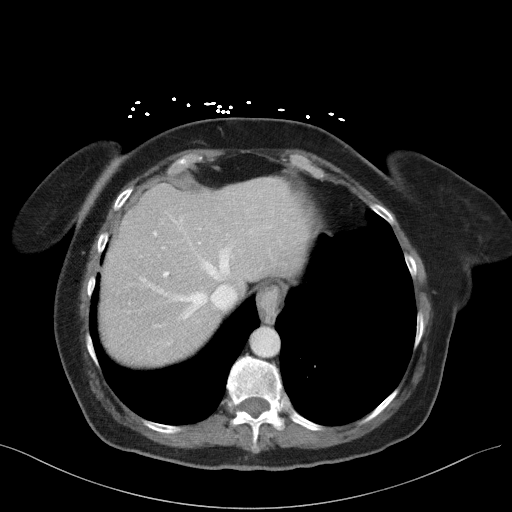
[im 79/91  soft-tissue]
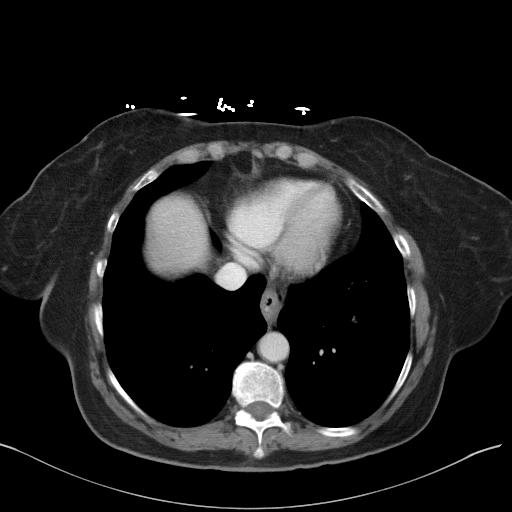
[im 85/91  soft-tissue]
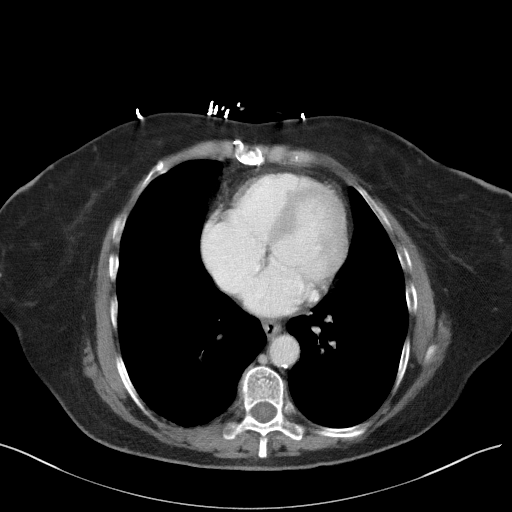

[Series 6: coronal st · coronal · 0.68mm/px · 3 of 88 slices shown]
[im 30/88  soft-tissue]
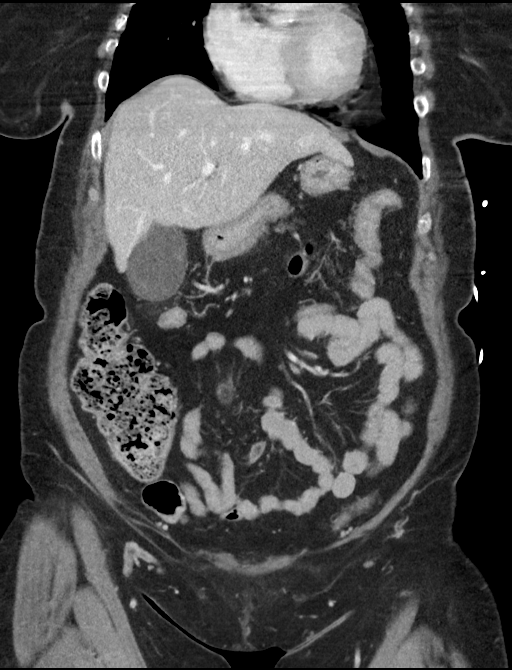
[im 39/88  soft-tissue]
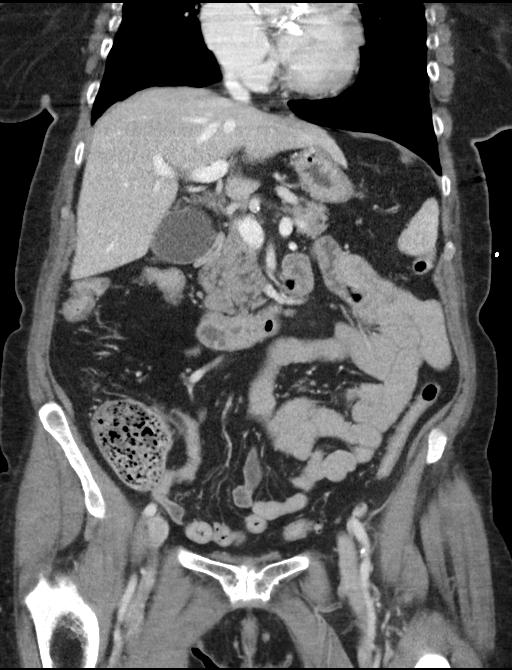
[im 49/88  soft-tissue]
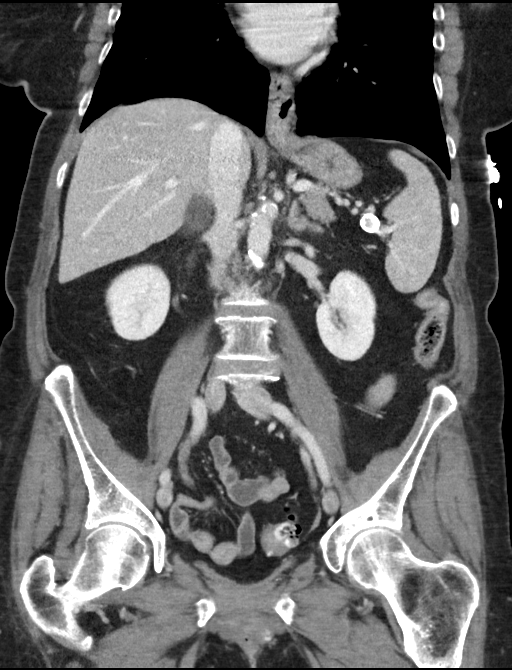

[16 of 46 positions shown; findings below may reference images not displayed]

FINDINGS: Lower chest: No acute abnormality.

Hepatobiliary: Gallbladder is well distended. No pericholecystic
inflammatory changes are noted. The known gallstones are not as well
appreciated on this study. The liver shows no focal mass.

Pancreas: Unremarkable. No pancreatic ductal dilatation or
surrounding inflammatory changes.

Spleen: Heavily calcified splenic artery aneurysm is noted measuring
13 mm. No other focal splenic abnormality is noted.

Adrenals/Urinary Tract: Adrenal glands are unremarkable. Kidneys are
normal, without renal calculi, focal lesion, or hydronephrosis.
Bladder is decompressed.

Stomach/Bowel: Diffuse diverticular change is noted without evidence
of diverticulitis. No obstructive changes are seen.

Vascular/Lymphatic: Aortic atherosclerosis. No enlarged abdominal or
pelvic lymph nodes.

Reproductive: Status post hysterectomy. No adnexal masses.

Other: No abdominal wall hernia or abnormality. No abdominopelvic
ascites.

Musculoskeletal: Degenerative changes of lumbar spine are noted. No
other focal abnormality is seen.
IMPRESSION: Previously seen gallstones are not well appreciated on this exam. No
inflammatory changes of the gallbladder are seen.

Chronic changes as described above without acute abnormality.

## 2018-12-12 ENCOUNTER — Other Ambulatory Visit: Payer: Self-pay

## 2018-12-17 DIAGNOSIS — I4891 Unspecified atrial fibrillation: Secondary | ICD-10-CM | POA: Diagnosis not present

## 2018-12-17 DIAGNOSIS — Z7901 Long term (current) use of anticoagulants: Secondary | ICD-10-CM | POA: Diagnosis not present

## 2018-12-17 DIAGNOSIS — Z954 Presence of other heart-valve replacement: Secondary | ICD-10-CM | POA: Diagnosis not present

## 2018-12-17 DIAGNOSIS — D6869 Other thrombophilia: Secondary | ICD-10-CM | POA: Diagnosis not present

## 2019-02-07 ENCOUNTER — Telehealth: Payer: Self-pay | Admitting: Cardiology

## 2019-02-07 NOTE — Telephone Encounter (Signed)
Returned a call to the patient and instructed pt since she is a pt of Dr. Harl Bowie that we could see her in our Anticoagulation Clinic. She states that her PCP is only open for some appts and not open daily. She would like to know how much it will cost, advised that since I do not do billing that I don't know, when she ask how will it be billed, advised I do not have the answer to that but our billing dept could assist her with that and I stated it is best to speak with them. She politely took the number and tho billing and given option 7 and she will call on Monday and a call back if she decides to see [name]. Advised that we will assist in getting her an appt when she decides.

## 2019-02-07 NOTE — Telephone Encounter (Signed)
Wants to start getting INR checked in [place]. Was going to doctor in [place] but they are not seeing patients at this time

## 2019-02-10 ENCOUNTER — Telehealth: Payer: Self-pay | Admitting: Cardiology

## 2019-02-10 NOTE — Telephone Encounter (Signed)
Per Suanne Marker patient is asking to have her CCR checked in [place] office this week. Please call patient and advise what time or let me know and I will call .   We have not been checking her.  Her doctor in [place] has been

## 2019-02-10 NOTE — Telephone Encounter (Signed)
Attempted to call pt back to schedule her a new patient appointment in the [place] office as requested.  LMOM TCB for appt.  Pt will be new to our clinic, but not new to coumadin.

## 2019-02-26 ENCOUNTER — Other Ambulatory Visit: Payer: Self-pay

## 2019-02-26 ENCOUNTER — Ambulatory Visit (INDEPENDENT_AMBULATORY_CARE_PROVIDER_SITE_OTHER): Payer: Medicare Other | Admitting: *Deleted

## 2019-02-26 DIAGNOSIS — Z952 Presence of prosthetic heart valve: Secondary | ICD-10-CM | POA: Diagnosis not present

## 2019-02-26 DIAGNOSIS — Z5181 Encounter for therapeutic drug level monitoring: Secondary | ICD-10-CM

## 2019-02-26 LAB — POCT INR: INR: 3.4 — AB (ref 2.0–3.0)

## 2019-02-26 NOTE — Patient Instructions (Signed)
INR 3.4 Pt new to office today.  Previously managed in [facility].  She has warfarin 5mg  tablets, 6mg  tablets and 1mg  tablets.  She is taking 10mg  (5mg  tab x 2) on Mondays, Wednesday and Fridays and 7mg  (6mg  tablet and 1mg  tablet) on Sundays, Tuesdays and Thursdays for a total of 58mg /week.  Asked pt if she would like decrease # of pills she has by using different dosages but she declined.  Did not push the issue since she might be going back to [place].  She will continue above dose and F/U in 6 weeks.

## 2019-04-09 ENCOUNTER — Other Ambulatory Visit: Payer: Self-pay

## 2019-04-09 ENCOUNTER — Ambulatory Visit (INDEPENDENT_AMBULATORY_CARE_PROVIDER_SITE_OTHER): Payer: Medicare Other | Admitting: *Deleted

## 2019-04-09 DIAGNOSIS — Z952 Presence of prosthetic heart valve: Secondary | ICD-10-CM

## 2019-04-09 DIAGNOSIS — Z5181 Encounter for therapeutic drug level monitoring: Secondary | ICD-10-CM | POA: Diagnosis not present

## 2019-04-09 LAB — POCT INR: INR: 4.3 — AB (ref 2.0–3.0)

## 2019-04-09 NOTE — Patient Instructions (Signed)
Hold warfarin tonight then decrease dose to 7mg  daily Recheck in 2 wks

## 2019-04-21 ENCOUNTER — Other Ambulatory Visit (HOSPITAL_COMMUNITY): Payer: Self-pay | Admitting: Family Medicine

## 2019-04-21 ENCOUNTER — Other Ambulatory Visit: Payer: Self-pay | Admitting: Family Medicine

## 2019-04-21 DIAGNOSIS — E049 Nontoxic goiter, unspecified: Secondary | ICD-10-CM

## 2019-04-23 ENCOUNTER — Ambulatory Visit (INDEPENDENT_AMBULATORY_CARE_PROVIDER_SITE_OTHER): Payer: Medicare Other | Admitting: *Deleted

## 2019-04-23 ENCOUNTER — Other Ambulatory Visit: Payer: Self-pay

## 2019-04-23 DIAGNOSIS — Z952 Presence of prosthetic heart valve: Secondary | ICD-10-CM | POA: Diagnosis not present

## 2019-04-23 DIAGNOSIS — Z5181 Encounter for therapeutic drug level monitoring: Secondary | ICD-10-CM

## 2019-04-23 LAB — POCT INR: INR: 2.7 (ref 2.0–3.0)

## 2019-04-23 NOTE — Patient Instructions (Signed)
Continue warfarin 7.5mg  daily Pt will be seeing MD in [facility] 12/28 and will change coumadin management there

## 2019-04-28 ENCOUNTER — Other Ambulatory Visit: Payer: Self-pay

## 2019-04-28 ENCOUNTER — Ambulatory Visit (HOSPITAL_COMMUNITY)
Admission: RE | Admit: 2019-04-28 | Discharge: 2019-04-28 | Disposition: A | Discharge status: Home / Self Care | Payer: Medicare Other | Source: Ambulatory Visit | Attending: Family Medicine | Admitting: Family Medicine

## 2019-04-28 DIAGNOSIS — E049 Nontoxic goiter, unspecified: Secondary | ICD-10-CM | POA: Diagnosis present

## 2019-11-06 ENCOUNTER — Ambulatory Visit (INDEPENDENT_AMBULATORY_CARE_PROVIDER_SITE_OTHER): Payer: Medicare Other | Admitting: Cardiology

## 2019-11-06 ENCOUNTER — Encounter: Payer: Self-pay | Admitting: Cardiology

## 2019-11-06 ENCOUNTER — Other Ambulatory Visit: Payer: Self-pay

## 2019-11-06 VITALS — BP 134/70 | HR 82 | Ht 64.0 in | Wt 133.4 lb

## 2019-11-06 DIAGNOSIS — I10 Essential (primary) hypertension: Secondary | ICD-10-CM | POA: Diagnosis not present

## 2019-11-06 DIAGNOSIS — Z952 Presence of prosthetic heart valve: Secondary | ICD-10-CM

## 2019-11-06 NOTE — Patient Instructions (Signed)
Medication Instructions:   Your physician recommends that you continue on your current medications as directed. Please refer to the Current Medication list given to you today.  *If you need a refill on your cardiac medications before your next appointment, please call your pharmacy*   Lab Work: None today  If you have labs (blood work) drawn today and your tests are completely normal, you will receive your results only by: . MyChart Message (if you have MyChart) OR . A paper copy in the mail If you have any lab test that is abnormal or we need to change your treatment, we will call you to review the results.   Testing/Procedures: None today   Follow-Up: At [facility], you and your health needs are our priority.  As part of our continuing mission to provide you with exceptional heart care, we have created designated Provider Care Teams.  These Care Teams include your primary Cardiologist (physician) and Advanced Practice Providers (APPs -  Physician Assistants and Nurse Practitioners) who all work together to provide you with the care you need, when you need it.  We recommend signing up for the patient portal called "MyChart".  Sign up information is provided on this After Visit Summary.  MyChart is used to connect with patients for Virtual Visits (Telemedicine).  Patients are able to view lab/test results, encounter notes, upcoming appointments, etc.  Non-urgent messages can be sent to your provider as well.   To learn more about what you can do with MyChart, go to [email or web address].    Your next appointment:   12 month(s)  The format for your next appointment:   In Person  Provider:   Jonathan Branch, MD   Other Instructions None      Thank you for choosing [facility] !         
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
## 2020-01-01 DIAGNOSIS — I4891 Unspecified atrial fibrillation: Secondary | ICD-10-CM | POA: Diagnosis not present

## 2020-01-01 DIAGNOSIS — Z7901 Long term (current) use of anticoagulants: Secondary | ICD-10-CM | POA: Diagnosis not present

## 2020-01-01 DIAGNOSIS — A6929 Other conditions associated with Lyme disease: Secondary | ICD-10-CM | POA: Diagnosis not present

## 2020-01-01 DIAGNOSIS — D6869 Other thrombophilia: Secondary | ICD-10-CM | POA: Diagnosis not present

## 2020-01-01 DIAGNOSIS — Z954 Presence of other heart-valve replacement: Secondary | ICD-10-CM | POA: Diagnosis not present

## 2020-01-01 DIAGNOSIS — Z0001 Encounter for general adult medical examination with abnormal findings: Secondary | ICD-10-CM | POA: Diagnosis not present

## 2020-02-10 DIAGNOSIS — I6523 Occlusion and stenosis of bilateral carotid arteries: Secondary | ICD-10-CM | POA: Diagnosis not present

## 2020-02-10 DIAGNOSIS — R2689 Other abnormalities of gait and mobility: Secondary | ICD-10-CM | POA: Diagnosis not present

## 2020-02-10 DIAGNOSIS — I10 Essential (primary) hypertension: Secondary | ICD-10-CM | POA: Diagnosis not present

## 2020-02-10 DIAGNOSIS — R2681 Unsteadiness on feet: Secondary | ICD-10-CM | POA: Diagnosis not present

## 2020-02-10 DIAGNOSIS — Z09 Encounter for follow-up examination after completed treatment for conditions other than malignant neoplasm: Secondary | ICD-10-CM | POA: Diagnosis not present

## 2020-02-10 DIAGNOSIS — Z8673 Personal history of transient ischemic attack (TIA), and cerebral infarction without residual deficits: Secondary | ICD-10-CM | POA: Diagnosis not present

## 2020-02-10 DIAGNOSIS — Z9181 History of falling: Secondary | ICD-10-CM | POA: Diagnosis not present

## 2020-02-10 DIAGNOSIS — I6501 Occlusion and stenosis of right vertebral artery: Secondary | ICD-10-CM | POA: Diagnosis not present

## 2020-02-10 DIAGNOSIS — I6782 Cerebral ischemia: Secondary | ICD-10-CM | POA: Diagnosis not present

## 2020-02-10 DIAGNOSIS — D72829 Elevated white blood cell count, unspecified: Secondary | ICD-10-CM | POA: Diagnosis not present

## 2020-02-10 DIAGNOSIS — Z7901 Long term (current) use of anticoagulants: Secondary | ICD-10-CM | POA: Diagnosis not present

## 2020-02-10 DIAGNOSIS — H5440 Blindness, one eye, unspecified eye: Secondary | ICD-10-CM | POA: Diagnosis not present

## 2020-02-10 DIAGNOSIS — Z954 Presence of other heart-valve replacement: Secondary | ICD-10-CM | POA: Diagnosis not present

## 2020-02-10 DIAGNOSIS — Z79899 Other long term (current) drug therapy: Secondary | ICD-10-CM | POA: Diagnosis not present

## 2020-02-10 DIAGNOSIS — Z952 Presence of prosthetic heart valve: Secondary | ICD-10-CM | POA: Diagnosis not present

## 2020-02-10 DIAGNOSIS — R55 Syncope and collapse: Secondary | ICD-10-CM | POA: Diagnosis not present

## 2020-02-11 DIAGNOSIS — Z952 Presence of prosthetic heart valve: Secondary | ICD-10-CM | POA: Diagnosis not present

## 2020-02-11 DIAGNOSIS — R55 Syncope and collapse: Secondary | ICD-10-CM | POA: Diagnosis not present

## 2020-02-11 DIAGNOSIS — I348 Other nonrheumatic mitral valve disorders: Secondary | ICD-10-CM | POA: Diagnosis not present

## 2020-02-11 DIAGNOSIS — Q210 Ventricular septal defect: Secondary | ICD-10-CM | POA: Diagnosis not present

## 2020-02-11 DIAGNOSIS — Z8673 Personal history of transient ischemic attack (TIA), and cerebral infarction without residual deficits: Secondary | ICD-10-CM | POA: Diagnosis not present

## 2020-02-11 DIAGNOSIS — H5440 Blindness, one eye, unspecified eye: Secondary | ICD-10-CM | POA: Diagnosis not present

## 2020-02-11 DIAGNOSIS — Z953 Presence of xenogenic heart valve: Secondary | ICD-10-CM | POA: Diagnosis not present

## 2020-02-11 DIAGNOSIS — I6501 Occlusion and stenosis of right vertebral artery: Secondary | ICD-10-CM | POA: Diagnosis not present

## 2020-02-11 DIAGNOSIS — I059 Rheumatic mitral valve disease, unspecified: Secondary | ICD-10-CM | POA: Diagnosis not present

## 2020-02-11 DIAGNOSIS — I6523 Occlusion and stenosis of bilateral carotid arteries: Secondary | ICD-10-CM | POA: Diagnosis not present

## 2020-02-11 DIAGNOSIS — I10 Essential (primary) hypertension: Secondary | ICD-10-CM | POA: Diagnosis not present

## 2020-02-12 DIAGNOSIS — Z952 Presence of prosthetic heart valve: Secondary | ICD-10-CM | POA: Diagnosis not present

## 2020-02-12 DIAGNOSIS — D72829 Elevated white blood cell count, unspecified: Secondary | ICD-10-CM | POA: Diagnosis not present

## 2020-02-12 DIAGNOSIS — I10 Essential (primary) hypertension: Secondary | ICD-10-CM | POA: Diagnosis not present

## 2020-02-12 DIAGNOSIS — R55 Syncope and collapse: Secondary | ICD-10-CM | POA: Diagnosis not present

## 2020-02-12 DIAGNOSIS — H5440 Blindness, one eye, unspecified eye: Secondary | ICD-10-CM | POA: Diagnosis not present

## 2020-02-12 DIAGNOSIS — Z8673 Personal history of transient ischemic attack (TIA), and cerebral infarction without residual deficits: Secondary | ICD-10-CM | POA: Diagnosis not present

## 2020-02-12 DIAGNOSIS — Z09 Encounter for follow-up examination after completed treatment for conditions other than malignant neoplasm: Secondary | ICD-10-CM | POA: Diagnosis not present

## 2020-03-04 ENCOUNTER — Inpatient Hospital Stay (HOSPITAL_COMMUNITY): Payer: Medicare Other

## 2020-03-04 ENCOUNTER — Emergency Department (HOSPITAL_COMMUNITY): Payer: Medicare Other

## 2020-03-04 ENCOUNTER — Other Ambulatory Visit: Payer: Self-pay

## 2020-03-04 ENCOUNTER — Encounter (HOSPITAL_COMMUNITY): Payer: Self-pay | Admitting: Emergency Medicine

## 2020-03-04 ENCOUNTER — Inpatient Hospital Stay (HOSPITAL_COMMUNITY)
Admission: EM | Admit: 2020-03-04 | Discharge: 2020-03-09 | DRG: 481 | Disposition: A | Discharge status: Skilled Nursing Facility | Payer: Medicare Other | Attending: Internal Medicine | Admitting: Internal Medicine

## 2020-03-04 DIAGNOSIS — S72142A Displaced intertrochanteric fracture of left femur, initial encounter for closed fracture: Secondary | ICD-10-CM | POA: Diagnosis not present

## 2020-03-04 DIAGNOSIS — E8889 Other specified metabolic disorders: Secondary | ICD-10-CM | POA: Diagnosis present

## 2020-03-04 DIAGNOSIS — R739 Hyperglycemia, unspecified: Secondary | ICD-10-CM

## 2020-03-04 DIAGNOSIS — D849 Immunodeficiency, unspecified: Secondary | ICD-10-CM | POA: Diagnosis not present

## 2020-03-04 DIAGNOSIS — I10 Essential (primary) hypertension: Secondary | ICD-10-CM | POA: Diagnosis not present

## 2020-03-04 DIAGNOSIS — G319 Degenerative disease of nervous system, unspecified: Secondary | ICD-10-CM | POA: Diagnosis not present

## 2020-03-04 DIAGNOSIS — W010XXA Fall on same level from slipping, tripping and stumbling without subsequent striking against object, initial encounter: Secondary | ICD-10-CM | POA: Diagnosis present

## 2020-03-04 DIAGNOSIS — Z419 Encounter for procedure for purposes other than remedying health state, unspecified: Secondary | ICD-10-CM

## 2020-03-04 DIAGNOSIS — S72102A Unspecified trochanteric fracture of left femur, initial encounter for closed fracture: Secondary | ICD-10-CM | POA: Diagnosis not present

## 2020-03-04 DIAGNOSIS — Z20822 Contact with and (suspected) exposure to covid-19: Secondary | ICD-10-CM | POA: Diagnosis present

## 2020-03-04 DIAGNOSIS — Z806 Family history of leukemia: Secondary | ICD-10-CM | POA: Diagnosis not present

## 2020-03-04 DIAGNOSIS — Y92009 Unspecified place in unspecified non-institutional (private) residence as the place of occurrence of the external cause: Secondary | ICD-10-CM

## 2020-03-04 DIAGNOSIS — W19XXXA Unspecified fall, initial encounter: Secondary | ICD-10-CM | POA: Diagnosis not present

## 2020-03-04 DIAGNOSIS — Z7401 Bed confinement status: Secondary | ICD-10-CM | POA: Diagnosis not present

## 2020-03-04 DIAGNOSIS — Z8619 Personal history of other infectious and parasitic diseases: Secondary | ICD-10-CM | POA: Diagnosis not present

## 2020-03-04 DIAGNOSIS — Z952 Presence of prosthetic heart valve: Secondary | ICD-10-CM | POA: Diagnosis not present

## 2020-03-04 DIAGNOSIS — S7222XA Displaced subtrochanteric fracture of left femur, initial encounter for closed fracture: Secondary | ICD-10-CM | POA: Insufficient documentation

## 2020-03-04 DIAGNOSIS — E876 Hypokalemia: Secondary | ICD-10-CM | POA: Diagnosis not present

## 2020-03-04 DIAGNOSIS — D72829 Elevated white blood cell count, unspecified: Secondary | ICD-10-CM | POA: Diagnosis not present

## 2020-03-04 DIAGNOSIS — I639 Cerebral infarction, unspecified: Secondary | ICD-10-CM | POA: Diagnosis not present

## 2020-03-04 DIAGNOSIS — S72142D Displaced intertrochanteric fracture of left femur, subsequent encounter for closed fracture with routine healing: Secondary | ICD-10-CM | POA: Diagnosis not present

## 2020-03-04 DIAGNOSIS — J42 Unspecified chronic bronchitis: Secondary | ICD-10-CM | POA: Diagnosis not present

## 2020-03-04 DIAGNOSIS — D62 Acute posthemorrhagic anemia: Secondary | ICD-10-CM | POA: Diagnosis not present

## 2020-03-04 DIAGNOSIS — R5382 Chronic fatigue, unspecified: Secondary | ICD-10-CM | POA: Diagnosis not present

## 2020-03-04 DIAGNOSIS — E785 Hyperlipidemia, unspecified: Secondary | ICD-10-CM | POA: Diagnosis not present

## 2020-03-04 DIAGNOSIS — R0902 Hypoxemia: Secondary | ICD-10-CM | POA: Diagnosis not present

## 2020-03-04 DIAGNOSIS — J99 Respiratory disorders in diseases classified elsewhere: Secondary | ICD-10-CM | POA: Diagnosis not present

## 2020-03-04 DIAGNOSIS — Z8249 Family history of ischemic heart disease and other diseases of the circulatory system: Secondary | ICD-10-CM

## 2020-03-04 DIAGNOSIS — D75839 Thrombocytosis, unspecified: Secondary | ICD-10-CM | POA: Diagnosis not present

## 2020-03-04 DIAGNOSIS — E611 Iron deficiency: Secondary | ICD-10-CM | POA: Diagnosis not present

## 2020-03-04 DIAGNOSIS — R262 Difficulty in walking, not elsewhere classified: Secondary | ICD-10-CM | POA: Diagnosis not present

## 2020-03-04 DIAGNOSIS — S72145A Nondisplaced intertrochanteric fracture of left femur, initial encounter for closed fracture: Secondary | ICD-10-CM | POA: Diagnosis not present

## 2020-03-04 DIAGNOSIS — S72002A Fracture of unspecified part of neck of left femur, initial encounter for closed fracture: Secondary | ICD-10-CM

## 2020-03-04 DIAGNOSIS — Z87891 Personal history of nicotine dependence: Secondary | ICD-10-CM

## 2020-03-04 DIAGNOSIS — M1612 Unilateral primary osteoarthritis, left hip: Secondary | ICD-10-CM | POA: Diagnosis not present

## 2020-03-04 DIAGNOSIS — D509 Iron deficiency anemia, unspecified: Secondary | ICD-10-CM | POA: Diagnosis not present

## 2020-03-04 DIAGNOSIS — J439 Emphysema, unspecified: Secondary | ICD-10-CM | POA: Diagnosis not present

## 2020-03-04 DIAGNOSIS — Z4789 Encounter for other orthopedic aftercare: Secondary | ICD-10-CM | POA: Diagnosis not present

## 2020-03-04 DIAGNOSIS — S7292XA Unspecified fracture of left femur, initial encounter for closed fracture: Secondary | ICD-10-CM | POA: Diagnosis present

## 2020-03-04 DIAGNOSIS — I69354 Hemiplegia and hemiparesis following cerebral infarction affecting left non-dominant side: Secondary | ICD-10-CM

## 2020-03-04 DIAGNOSIS — Z970 Presence of artificial eye: Secondary | ICD-10-CM | POA: Diagnosis not present

## 2020-03-04 DIAGNOSIS — I739 Peripheral vascular disease, unspecified: Secondary | ICD-10-CM | POA: Diagnosis not present

## 2020-03-04 DIAGNOSIS — E559 Vitamin D deficiency, unspecified: Secondary | ICD-10-CM | POA: Diagnosis not present

## 2020-03-04 DIAGNOSIS — W19XXXD Unspecified fall, subsequent encounter: Secondary | ICD-10-CM | POA: Diagnosis not present

## 2020-03-04 DIAGNOSIS — K589 Irritable bowel syndrome without diarrhea: Secondary | ICD-10-CM | POA: Diagnosis not present

## 2020-03-04 DIAGNOSIS — M2550 Pain in unspecified joint: Secondary | ICD-10-CM | POA: Diagnosis not present

## 2020-03-04 DIAGNOSIS — F29 Unspecified psychosis not due to a substance or known physiological condition: Secondary | ICD-10-CM | POA: Diagnosis not present

## 2020-03-04 DIAGNOSIS — Z01818 Encounter for other preprocedural examination: Secondary | ICD-10-CM | POA: Diagnosis not present

## 2020-03-04 DIAGNOSIS — R531 Weakness: Secondary | ICD-10-CM | POA: Diagnosis not present

## 2020-03-04 DIAGNOSIS — I6389 Other cerebral infarction: Secondary | ICD-10-CM | POA: Diagnosis not present

## 2020-03-04 DIAGNOSIS — M6281 Muscle weakness (generalized): Secondary | ICD-10-CM | POA: Diagnosis not present

## 2020-03-04 DIAGNOSIS — I708 Atherosclerosis of other arteries: Secondary | ICD-10-CM | POA: Diagnosis not present

## 2020-03-04 LAB — CBC WITH DIFFERENTIAL/PLATELET
Abs Immature Granulocytes: 0.16 10*3/uL — ABNORMAL HIGH (ref 0.00–0.07)
Basophils Absolute: 0.1 10*3/uL (ref 0.0–0.1)
Basophils Relative: 0 %
Eosinophils Absolute: 0 10*3/uL (ref 0.0–0.5)
Eosinophils Relative: 0 %
HCT: 42 % (ref 36.0–46.0)
Hemoglobin: 14 g/dL (ref 12.0–15.0)
Immature Granulocytes: 1 %
Lymphocytes Relative: 4 %
Lymphs Abs: 0.8 10*3/uL (ref 0.7–4.0)
MCH: 29.8 pg (ref 26.0–34.0)
MCHC: 33.3 g/dL (ref 30.0–36.0)
MCV: 89.4 fL (ref 80.0–100.0)
Monocytes Absolute: 1.5 10*3/uL — ABNORMAL HIGH (ref 0.1–1.0)
Monocytes Relative: 7 %
Neutro Abs: 19.7 10*3/uL — ABNORMAL HIGH (ref 1.7–7.7)
Neutrophils Relative %: 88 %
Platelets: 1054 10*3/uL (ref 150–400)
RBC: 4.7 MIL/uL (ref 3.87–5.11)
RDW: 14.4 % (ref 11.5–15.5)
WBC: 22.3 10*3/uL — ABNORMAL HIGH (ref 4.0–10.5)
nRBC: 0 % (ref 0.0–0.2)

## 2020-03-04 LAB — URINALYSIS, ROUTINE W REFLEX MICROSCOPIC
Bilirubin Urine: NEGATIVE
Glucose, UA: 50 mg/dL — AB
Hgb urine dipstick: NEGATIVE
Ketones, ur: 5 mg/dL — AB
Leukocytes,Ua: NEGATIVE
Nitrite: NEGATIVE
Protein, ur: NEGATIVE mg/dL
Specific Gravity, Urine: 1.018 (ref 1.005–1.030)
pH: 6 (ref 5.0–8.0)

## 2020-03-04 LAB — PROTIME-INR
INR: 2.6 — ABNORMAL HIGH (ref 0.8–1.2)
Prothrombin Time: 27 seconds — ABNORMAL HIGH (ref 11.4–15.2)

## 2020-03-04 LAB — COMPREHENSIVE METABOLIC PANEL
ALT: 24 U/L (ref 0–44)
AST: 28 U/L (ref 15–41)
Albumin: 4.4 g/dL (ref 3.5–5.0)
Alkaline Phosphatase: 53 U/L (ref 38–126)
Anion gap: 11 (ref 5–15)
BUN: 26 mg/dL — ABNORMAL HIGH (ref 8–23)
CO2: 25 mmol/L (ref 22–32)
Calcium: 9 mg/dL (ref 8.9–10.3)
Chloride: 100 mmol/L (ref 98–111)
Creatinine, Ser: 0.67 mg/dL (ref 0.44–1.00)
GFR, Estimated: >60 mL/min (ref >60)
Glucose, Bld: 161 mg/dL — ABNORMAL HIGH (ref 70–99)
Potassium: 4 mmol/L (ref 3.5–5.1)
Sodium: 136 mmol/L (ref 135–145)
Total Bilirubin: 0.7 mg/dL (ref 0.3–1.2)
Total Protein: 7.5 g/dL (ref 6.5–8.1)

## 2020-03-04 LAB — RESPIRATORY PANEL BY RT PCR (FLU A&B, COVID)
Influenza A by PCR: NEGATIVE
Influenza B by PCR: NEGATIVE
SARS Coronavirus 2 by RT PCR: NEGATIVE

## 2020-03-04 LAB — APTT: aPTT: 46 seconds — ABNORMAL HIGH (ref 24–36)

## 2020-03-04 MED ORDER — CEFAZOLIN SODIUM-DEXTROSE 2-4 GM/100ML-% IV SOLN
2.0000 g | INTRAVENOUS | Status: AC
  Administered 2020-03-05: 2 g via INTRAVENOUS
  Filled 2020-03-04: qty 100

## 2020-03-04 MED ORDER — ENSURE PRE-SURGERY PO LIQD
296.0000 mL | Freq: Once | ORAL | Status: AC
  Administered 2020-03-05: 296 mL via ORAL
  Filled 2020-03-04: qty 296

## 2020-03-04 MED ORDER — AMLODIPINE BESYLATE 10 MG PO TABS
10.0000 mg | ORAL_TABLET | Freq: Every evening | ORAL | Status: DC
  Administered 2020-03-05 – 2020-03-08 (×5): 10 mg via ORAL
  Filled 2020-03-04 (×5): qty 1

## 2020-03-04 MED ORDER — HYDROMORPHONE HCL 1 MG/ML IJ SOLN
0.5000 mg | INTRAMUSCULAR | Status: DC | PRN
  Administered 2020-03-04 – 2020-03-05 (×8): 0.5 mg via INTRAVENOUS
  Filled 2020-03-04 (×2): qty 1
  Filled 2020-03-04 (×2): qty 0.5
  Filled 2020-03-04 (×4): qty 1

## 2020-03-04 MED ORDER — POVIDONE-IODINE 10 % EX SWAB: 2.0000 "application " | Freq: Once | CUTANEOUS | Status: DC

## 2020-03-04 MED ORDER — SODIUM CHLORIDE 0.9 % IV SOLN: INTRAVENOUS | Status: DC

## 2020-03-04 MED ORDER — MELATONIN 3 MG PO TABS
3.0000 mg | ORAL_TABLET | Freq: Every day | ORAL | Status: DC
  Administered 2020-03-05 – 2020-03-08 (×5): 3 mg via ORAL
  Filled 2020-03-04 (×5): qty 1

## 2020-03-04 MED ORDER — MAGNESIUM OXIDE 400 (241.3 MG) MG PO TABS
400.0000 mg | ORAL_TABLET | Freq: Every day | ORAL | Status: DC
  Administered 2020-03-05 – 2020-03-09 (×6): 400 mg via ORAL
  Filled 2020-03-04 (×6): qty 1

## 2020-03-04 MED ORDER — CHLORHEXIDINE GLUCONATE 4 % EX LIQD
60.0000 mL | Freq: Once | CUTANEOUS | Status: AC
  Administered 2020-03-05: 4 via TOPICAL
  Filled 2020-03-04: qty 60

## 2020-03-04 MED ORDER — FENTANYL CITRATE (PF) 100 MCG/2ML IJ SOLN
50.0000 ug | Freq: Once | INTRAMUSCULAR | Status: AC
  Administered 2020-03-04: 50 ug via INTRAVENOUS
  Filled 2020-03-04: qty 2

## 2020-03-04 MED ORDER — FENTANYL CITRATE (PF) 100 MCG/2ML IJ SOLN
50.0000 ug | INTRAMUSCULAR | Status: DC | PRN
  Administered 2020-03-04 (×2): 50 ug via INTRAVENOUS
  Filled 2020-03-04 (×2): qty 2

## 2020-03-04 MED ORDER — VITAMIN K1 10 MG/ML IJ SOLN
5.0000 mg | Freq: Once | INTRAVENOUS | Status: AC
  Administered 2020-03-04: 5 mg via INTRAVENOUS
  Filled 2020-03-04: qty 0.5

## 2020-03-04 MED ORDER — OYSTER CALCIUM 500 MG PO TABS
500.0000 mg | ORAL_TABLET | Freq: Two times a day (BID) | ORAL | Status: DC
  Administered 2020-03-05 – 2020-03-09 (×10): 500 mg via ORAL
  Filled 2020-03-04 (×20): qty 1

## 2020-03-04 NOTE — ED Notes (Signed)
Pt reports she took 2 hydrocodone at 2000 and 2 more at 0015-expiration date on bottle of 08/13/2015
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
## 2020-03-04 NOTE — ED Triage Notes (Signed)
Pt reports she fell on a concrete floor last night at 2000; pt c/o left hip pain

## 2020-03-04 NOTE — ED Notes (Signed)
Assumed care of pt at 0700

## 2020-03-04 NOTE — Progress Notes (Signed)
Patient is now requesting her Calcium 500 mg and her Melatonin 1mg  tab.  I will contact the on call again to get orders.
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
## 2020-03-04 NOTE — Progress Notes (Addendum)
Patient seen and examined and agree with plan of care  74 year old lady admitted recently 02/10/2019 recent stroke unresponsiveness CTA showed PT for effusion MRI showed acute to subacute infarct which are chronic-she was started on aspirin patient-she declined statin 6.  She also had a CVA in 2018  She has been seen from prior Lyme disease as a history of hypertension aortic valve repair 1998 with St. Jude mechanical and also has a history of chronic RMSF   she was working yesterday with her family and sustained a mechanical fall and sustained a left hip fracture  CT head been ordered fall and possible concussion 27 replacement she is not using blood transfusion but she is a Sales promotion account executive Witness  Notable that she has thrombocytosis, her peripheral smear is pending she also has leukocytosis of the stool may indicate myelodysplasia which will need to be worked up but does not have any impact on surgery  Given she has sustained a recent stroke she is intermediate to high risk for procedure if she goes under general anesthesia--- if this was an elective surgery however since somewhat emergent hand pain may be an issue risk-benefit discussion should occur between anesthesia and orthopedics   Otherwise she is hemodynamically stable and she will be transferred to [facility] for definitive management and discussion She will probably not get the surgery evaluated and placed on a diet started IV fluids

## 2020-03-04 NOTE — Progress Notes (Signed)
Patient has arrived to [address] from [facility].  She is requesting her evening meds of Magnesium 1200 mg PO and her Norvasc 10 mg.  Contacted on call MD through [facility] for orders.

## 2020-03-04 NOTE — ED Notes (Signed)
Date and time results received: 03/04/20 0540 (use smartphrase ".now" to insert current time)  Test: platelets Critical Value: 1054  Name of Provider Notified: Dr Tomi Bamberger  Orders Received? Or Actions Taken?: Actions Taken: no orders received

## 2020-03-04 NOTE — ED Notes (Signed)
Pt educated on pain medication and frequency.  Pt verbalized understanding

## 2020-03-05 ENCOUNTER — Inpatient Hospital Stay (HOSPITAL_COMMUNITY): Payer: Medicare Other | Admitting: Certified Registered"

## 2020-03-05 ENCOUNTER — Inpatient Hospital Stay (HOSPITAL_COMMUNITY): Payer: Medicare Other

## 2020-03-05 ENCOUNTER — Encounter (HOSPITAL_COMMUNITY)
Admission: EM | Disposition: A | Discharge status: Skilled Nursing Facility | Payer: Self-pay | Source: Home / Self Care | Attending: Internal Medicine

## 2020-03-05 ENCOUNTER — Encounter (HOSPITAL_COMMUNITY): Payer: Self-pay | Admitting: Internal Medicine

## 2020-03-05 DIAGNOSIS — S72142A Displaced intertrochanteric fracture of left femur, initial encounter for closed fracture: Secondary | ICD-10-CM | POA: Diagnosis not present

## 2020-03-05 HISTORY — PX: INTRAMEDULLARY (IM) NAIL INTERTROCHANTERIC: SHX5875

## 2020-03-05 LAB — NO BLOOD PRODUCTS

## 2020-03-05 LAB — CBC WITH DIFFERENTIAL/PLATELET
Abs Immature Granulocytes: 0.08 10*3/uL — ABNORMAL HIGH (ref 0.00–0.07)
Basophils Absolute: 0.1 10*3/uL (ref 0.0–0.1)
Basophils Relative: 1 %
Eosinophils Absolute: 0.1 10*3/uL (ref 0.0–0.5)
Eosinophils Relative: 1 %
HCT: 40.2 % (ref 36.0–46.0)
Hemoglobin: 13 g/dL (ref 12.0–15.0)
Immature Granulocytes: 1 %
Lymphocytes Relative: 7 %
Lymphs Abs: 1 10*3/uL (ref 0.7–4.0)
MCH: 29.5 pg (ref 26.0–34.0)
MCHC: 32.3 g/dL (ref 30.0–36.0)
MCV: 91.4 fL (ref 80.0–100.0)
Monocytes Absolute: 2.5 10*3/uL — ABNORMAL HIGH (ref 0.1–1.0)
Monocytes Relative: 17 %
Neutro Abs: 11.1 10*3/uL — ABNORMAL HIGH (ref 1.7–7.7)
Neutrophils Relative %: 73 %
Platelets: 765 10*3/uL — ABNORMAL HIGH (ref 150–400)
RBC: 4.4 MIL/uL (ref 3.87–5.11)
RDW: 14.5 % (ref 11.5–15.5)
WBC: 14.9 10*3/uL — ABNORMAL HIGH (ref 4.0–10.5)
nRBC: 0 % (ref 0.0–0.2)

## 2020-03-05 LAB — SURGICAL PCR SCREEN
MRSA, PCR: NEGATIVE
Staphylococcus aureus: NEGATIVE

## 2020-03-05 LAB — BASIC METABOLIC PANEL
Anion gap: 10 (ref 5–15)
BUN: 13 mg/dL (ref 8–23)
CO2: 25 mmol/L (ref 22–32)
Calcium: 8.6 mg/dL — ABNORMAL LOW (ref 8.9–10.3)
Chloride: 102 mmol/L (ref 98–111)
Creatinine, Ser: 0.72 mg/dL (ref 0.44–1.00)
GFR, Estimated: >60 mL/min (ref >60)
Glucose, Bld: 118 mg/dL — ABNORMAL HIGH (ref 70–99)
Potassium: 3.9 mmol/L (ref 3.5–5.1)
Sodium: 137 mmol/L (ref 135–145)

## 2020-03-05 LAB — PATHOLOGIST SMEAR REVIEW

## 2020-03-05 LAB — PROTIME-INR
INR: 1.3 — ABNORMAL HIGH (ref 0.8–1.2)
Prothrombin Time: 16 seconds — ABNORMAL HIGH (ref 11.4–15.2)

## 2020-03-05 SURGERY — FIXATION, FRACTURE, INTERTROCHANTERIC, WITH INTRAMEDULLARY ROD
Anesthesia: General | Laterality: Left

## 2020-03-05 MED ORDER — 0.9 % SODIUM CHLORIDE (POUR BTL) OPTIME
TOPICAL | Status: DC | PRN
  Administered 2020-03-05: 1000 mL

## 2020-03-05 MED ORDER — MAGNESIUM HYDROXIDE 400 MG/5ML PO SUSP: 30.0000 mL | Freq: Every day | ORAL | Status: DC | PRN

## 2020-03-05 MED ORDER — ONDANSETRON HCL 4 MG/2ML IJ SOLN: 4.0000 mg | Freq: Four times a day (QID) | INTRAMUSCULAR | Status: DC | PRN

## 2020-03-05 MED ORDER — ONDANSETRON HCL 4 MG/2ML IJ SOLN
INTRAMUSCULAR | Status: DC | PRN
  Administered 2020-03-05: 4 mg via INTRAVENOUS

## 2020-03-05 MED ORDER — OXYCODONE HCL 5 MG PO TABS: 5.0000 mg | ORAL_TABLET | Freq: Once | ORAL | Status: DC | PRN

## 2020-03-05 MED ORDER — FENTANYL CITRATE (PF) 100 MCG/2ML IJ SOLN
INTRAMUSCULAR | Status: DC | PRN
  Administered 2020-03-05: 50 ug via INTRAVENOUS

## 2020-03-05 MED ORDER — PROPOFOL 10 MG/ML IV BOLUS
INTRAVENOUS | Status: DC | PRN
  Administered 2020-03-05: 110 mg via INTRAVENOUS

## 2020-03-05 MED ORDER — ROCURONIUM BROMIDE 10 MG/ML (PF) SYRINGE
PREFILLED_SYRINGE | INTRAVENOUS | Status: DC | PRN
  Administered 2020-03-05: 20 mg via INTRAVENOUS
  Administered 2020-03-05: 50 mg via INTRAVENOUS

## 2020-03-05 MED ORDER — CHLORHEXIDINE GLUCONATE 0.12 % MT SOLN
OROMUCOSAL | Status: AC
  Administered 2020-03-05: 15 mL via OROMUCOSAL
  Filled 2020-03-05: qty 15

## 2020-03-05 MED ORDER — CHLORHEXIDINE GLUCONATE CLOTH 2 % EX PADS
6.0000 | MEDICATED_PAD | Freq: Every day | CUTANEOUS | Status: DC
  Administered 2020-03-07 – 2020-03-09 (×3): 6 via TOPICAL

## 2020-03-05 MED ORDER — ACETAMINOPHEN 500 MG PO TABS
500.0000 mg | ORAL_TABLET | Freq: Three times a day (TID) | ORAL | Status: DC
  Administered 2020-03-05 – 2020-03-08 (×4): 500 mg via ORAL
  Filled 2020-03-05 (×7): qty 1

## 2020-03-05 MED ORDER — PROPOFOL 10 MG/ML IV BOLUS
INTRAVENOUS | Status: AC
  Filled 2020-03-05: qty 20

## 2020-03-05 MED ORDER — CHLORHEXIDINE GLUCONATE 0.12 % MT SOLN: 15.0000 mL | Freq: Once | OROMUCOSAL | Status: AC

## 2020-03-05 MED ORDER — LIDOCAINE 2% (20 MG/ML) 5 ML SYRINGE
INTRAMUSCULAR | Status: DC | PRN
  Administered 2020-03-05: 40 mg via INTRAVENOUS

## 2020-03-05 MED ORDER — HYDROCODONE-ACETAMINOPHEN 5-325 MG PO TABS
1.0000 | ORAL_TABLET | ORAL | Status: DC | PRN
  Administered 2020-03-05 – 2020-03-06 (×4): 1 via ORAL
  Filled 2020-03-05 (×4): qty 1

## 2020-03-05 MED ORDER — ONDANSETRON HCL 4 MG PO TABS: 4.0000 mg | ORAL_TABLET | Freq: Four times a day (QID) | ORAL | Status: DC | PRN

## 2020-03-05 MED ORDER — ACETAMINOPHEN 10 MG/ML IV SOLN
INTRAVENOUS | Status: AC
  Filled 2020-03-05: qty 100

## 2020-03-05 MED ORDER — DEXMEDETOMIDINE HCL IN NACL 80 MCG/20ML IV SOLN
INTRAVENOUS | Status: AC
  Filled 2020-03-05: qty 20

## 2020-03-05 MED ORDER — FENTANYL CITRATE (PF) 100 MCG/2ML IJ SOLN: 25.0000 ug | INTRAMUSCULAR | Status: DC | PRN

## 2020-03-05 MED ORDER — NALOXONE HCL 0.4 MG/ML IJ SOLN
INTRAMUSCULAR | Status: AC
  Filled 2020-03-05: qty 1

## 2020-03-05 MED ORDER — SUGAMMADEX SODIUM 200 MG/2ML IV SOLN
INTRAVENOUS | Status: DC | PRN
  Administered 2020-03-05: 125 mg via INTRAVENOUS

## 2020-03-05 MED ORDER — PHENYLEPHRINE HCL-NACL 10-0.9 MG/250ML-% IV SOLN
INTRAVENOUS | Status: DC | PRN
  Administered 2020-03-05: 50 ug/min via INTRAVENOUS

## 2020-03-05 MED ORDER — FENTANYL CITRATE (PF) 250 MCG/5ML IJ SOLN
INTRAMUSCULAR | Status: AC
  Filled 2020-03-05: qty 5

## 2020-03-05 MED ORDER — ALUM & MAG HYDROXIDE-SIMETH 200-200-20 MG/5ML PO SUSP: 30.0000 mL | ORAL | Status: DC | PRN

## 2020-03-05 MED ORDER — MORPHINE SULFATE (PF) 2 MG/ML IV SOLN: 0.5000 mg | INTRAVENOUS | Status: DC | PRN

## 2020-03-05 MED ORDER — ONDANSETRON HCL 4 MG/2ML IJ SOLN: 4.0000 mg | Freq: Once | INTRAMUSCULAR | Status: DC | PRN

## 2020-03-05 MED ORDER — ACETAMINOPHEN 10 MG/ML IV SOLN
INTRAVENOUS | Status: DC | PRN
  Administered 2020-03-05: 1000 mg via INTRAVENOUS

## 2020-03-05 MED ORDER — PHENOL 1.4 % MT LIQD: 1.0000 | OROMUCOSAL | Status: DC | PRN

## 2020-03-05 MED ORDER — LACTATED RINGERS IV SOLN: INTRAVENOUS | Status: DC

## 2020-03-05 MED ORDER — PHENYLEPHRINE HCL (PRESSORS) 10 MG/ML IV SOLN
INTRAVENOUS | Status: DC | PRN
  Administered 2020-03-05: 80 ug via INTRAVENOUS
  Administered 2020-03-05: 120 ug via INTRAVENOUS

## 2020-03-05 MED ORDER — DOCUSATE SODIUM 100 MG PO CAPS
100.0000 mg | ORAL_CAPSULE | Freq: Two times a day (BID) | ORAL | Status: DC
  Administered 2020-03-05 – 2020-03-09 (×8): 100 mg via ORAL
  Filled 2020-03-05 (×8): qty 1

## 2020-03-05 MED ORDER — METOCLOPRAMIDE HCL 5 MG/ML IJ SOLN: 5.0000 mg | Freq: Three times a day (TID) | INTRAMUSCULAR | Status: DC | PRN

## 2020-03-05 MED ORDER — NALOXONE HCL 0.4 MG/ML IJ SOLN
0.4000 mg | INTRAMUSCULAR | Status: DC | PRN
  Administered 2020-03-05 – 2020-03-07 (×2): 0.4 mg via INTRAVENOUS
  Filled 2020-03-05 (×2): qty 1

## 2020-03-05 MED ORDER — MENTHOL 3 MG MT LOZG: 1.0000 | LOZENGE | OROMUCOSAL | Status: DC | PRN

## 2020-03-05 MED ORDER — ACETAMINOPHEN 325 MG PO TABS
325.0000 mg | ORAL_TABLET | Freq: Four times a day (QID) | ORAL | Status: DC | PRN
  Filled 2020-03-05: qty 2

## 2020-03-05 MED ORDER — ENOXAPARIN SODIUM 30 MG/0.3ML ~~LOC~~ SOLN
30.0000 mg | Freq: Two times a day (BID) | SUBCUTANEOUS | Status: DC
  Administered 2020-03-06 – 2020-03-09 (×7): 30 mg via SUBCUTANEOUS
  Filled 2020-03-05 (×7): qty 0.3

## 2020-03-05 MED ORDER — OXYCODONE HCL 5 MG/5ML PO SOLN: 5.0000 mg | Freq: Once | ORAL | Status: DC | PRN

## 2020-03-05 MED ORDER — METOCLOPRAMIDE HCL 5 MG PO TABS: 5.0000 mg | ORAL_TABLET | Freq: Three times a day (TID) | ORAL | Status: DC | PRN

## 2020-03-05 SURGICAL SUPPLY — 50 items
BIT DRILL 4.0X280 (BIT) ×2 IMPLANT
BNDG COHESIVE 6X5 TAN STRL LF (GAUZE/BANDAGES/DRESSINGS) ×2 IMPLANT
BRUSH SCRUB EZ PLAIN DRY (MISCELLANEOUS) ×4 IMPLANT
COVER PERINEAL POST (MISCELLANEOUS) ×2 IMPLANT
COVER SURGICAL LIGHT HANDLE (MISCELLANEOUS) ×4 IMPLANT
COVER WAND RF STERILE (DRAPES) ×2 IMPLANT
DRAPE C-ARMOR (DRAPES) ×2 IMPLANT
DRAPE HALF SHEET 40X57 (DRAPES) ×4 IMPLANT
DRAPE INCISE IOBAN 66X45 STRL (DRAPES) ×2 IMPLANT
DRAPE STERI IOBAN 125X83 (DRAPES) ×2 IMPLANT
DRAPE U-SHAPE 47X51 STRL (DRAPES) ×2 IMPLANT
DRSG EMULSION OIL 3X3 NADH (GAUZE/BANDAGES/DRESSINGS) ×2 IMPLANT
DRSG MEPILEX BORDER 4X4 (GAUZE/BANDAGES/DRESSINGS) ×6 IMPLANT
DRSG MEPILEX BORDER 4X8 (GAUZE/BANDAGES/DRESSINGS) ×2 IMPLANT
ELECT REM PT RETURN 9FT ADLT (ELECTROSURGICAL) ×2
ELECTRODE REM PT RTRN 9FT ADLT (ELECTROSURGICAL) ×1 IMPLANT
GLOVE BIO SURGEON STRL SZ7.5 (GLOVE) ×4 IMPLANT
GLOVE BIO SURGEON STRL SZ8 (GLOVE) ×2 IMPLANT
GLOVE BIOGEL PI IND STRL 7.5 (GLOVE) ×3 IMPLANT
GLOVE BIOGEL PI IND STRL 8 (GLOVE) ×1 IMPLANT
GLOVE BIOGEL PI INDICATOR 7.5 (GLOVE) ×3
GLOVE BIOGEL PI INDICATOR 8 (GLOVE) ×1
GOWN STRL REUS W/ TWL LRG LVL3 (GOWN DISPOSABLE) ×4 IMPLANT
GOWN STRL REUS W/ TWL XL LVL3 (GOWN DISPOSABLE) ×1 IMPLANT
GOWN STRL REUS W/TWL LRG LVL3 (GOWN DISPOSABLE) ×8
GOWN STRL REUS W/TWL XL LVL3 (GOWN DISPOSABLE) ×2
GUIDE PIN 3.2X330 (PIN) ×4
GUIDEWIRE BALL NOSE 3.0X900 (WIRE) ×2 IMPLANT
KIT BASIN OR (CUSTOM PROCEDURE TRAY) ×2 IMPLANT
KIT TURNOVER KIT B (KITS) ×2 IMPLANT
MANIFOLD NEPTUNE II (INSTRUMENTS) ×2 IMPLANT
NAIL 11X36X130D ANGLED ES (Nail) ×2 IMPLANT
NS IRRIG 1000ML POUR BTL (IV SOLUTION) ×2 IMPLANT
PACK GENERAL/GYN (CUSTOM PROCEDURE TRAY) ×2 IMPLANT
PAD ARMBOARD 7.5X6 YLW CONV (MISCELLANEOUS) ×4 IMPLANT
PIN GUIDE 3.2X330 (PIN) ×2 IMPLANT
PIN GUIDE AO 3.2X381 (ORTHOPEDIC DISPOSABLE SUPPLIES) ×2 IMPLANT
SCREW LOCK CORT 5X34 (Screw) ×2 IMPLANT
SCREW LOCK CORT 5X36 (Screw) ×2 IMPLANT
SCREW LTY TIGHT LAG A 10.5X105 (Screw) ×2 IMPLANT
STAPLER VISISTAT 35W (STAPLE) ×2 IMPLANT
SUT ETHILON 3 0 PS 1 (SUTURE) ×4 IMPLANT
SUT VIC AB 0 CT1 27 (SUTURE) ×2
SUT VIC AB 0 CT1 27XBRD ANBCTR (SUTURE) ×1 IMPLANT
SUT VIC AB 2-0 CT1 27 (SUTURE) ×2
SUT VIC AB 2-0 CT1 TAPERPNT 27 (SUTURE) ×1 IMPLANT
SYR BULB IRRIG 60ML STRL (SYRINGE) ×2 IMPLANT
TOWEL GREEN STERILE (TOWEL DISPOSABLE) ×4 IMPLANT
TOWEL GREEN STERILE FF (TOWEL DISPOSABLE) ×2 IMPLANT
WATER STERILE IRR 1000ML POUR (IV SOLUTION) ×2 IMPLANT

## 2020-03-05 NOTE — Op Note (Signed)
03/05/2020  1:12 PM  PATIENT:  Ana [name]  74 y.o. female  PRE-OPERATIVE DIAGNOSIS:  Left hip fx  POST-OPERATIVE DIAGNOSIS:  Left hip fx  PROCEDURE:  INTRAMEDULLARY NAILING OF THE LEFT HIP using a AOS 11 X 360 MM STATICALLY LOCKED NAIL WITH REVERSE THREADED LAG SCREW.  SURGEON:  Astrid Divine. Marcelino Scot, M.D.  ASSISTANT:  1. Ainsley Spinner, PA-C. 2. PA Student  ANESTHESIA:  General.  COMPLICATIONS:  None.  ESTIMATED BLOOD LOSS:  Less than 150 mL.  DISPOSITION:  To PACU.  CONDITION:  Stable.  DELAY START OF DVT PROPHYLAXIS BECAUSE OF BLEEDING RISK: NO  BRIEF SUMMARY AND INDICATION OF PROCEDURE:  [name] is a 74 y.o. year- old with multiple medical problems.  I discussed with the patient and family risks and benefits of surgical treatment including the potential for malunion, nonunion, symptomatic hardware, heart attack, stroke, neurovascular injury, bleeding, and others.  After full discussion, the patient and family wished to proceed.  BRIEF SUMMARY OF PROCEDURE:  The patient was taken to the operating room where general anesthesia was induced.  She was positioned supine on the Hana fracture table.  A closed reduction maneuver was performed of the fractured proximal femur and this was confirmed on both AP and lateral xray views. A thorough scrub and wash with chlorhexidine and then Betadine scrub and paint was performed.  After sterile drapes and time-out, a long instrument was used to identify the appropriate starting position under C-arm on both AP and lateral images.  A 3 cm incision was made proximal to the greater trochanter.  The curved cannulated awl was inserted just medial to the tip of the lateral trochanter and then the starting guidewire advanced into the proximal femur.  This was checked on AP and lateral views.  The starting reamer was engaged with the soft tissue protected by a sleeve.  The curved ball-tipped guidewire was then inserted, making sure it was just  posterior as possible in the distal femur and across the fracture site, which stayed in a reduced position.  A 11 x 360 mm nail inserted to the appropriate depth.  The guidewire for the lag screw was then inserted with the appropriate anteversion to make sure it was in a center-center position. I did use antirotation pin because her bone was so vacuous and soft. I also used a reverse threaded lag screw made for left hips to reduce rotation at the neck. This was measured and the lag screw placed with decent purchase and position checked on both views. Traction was released and compression achieved with the screw.  This was followed by placement of one shaft locking screw off the jig.  This was confirmed on AP and lateral images. Wounds were irrigated thoroughly, closed in a standard layered fashion. Sterile gently compressive dressings were applied.  Ainsley Spinner, PA-C, assisted throughout.  The patient was awakened from anesthesia and transported to the PACU in stable condition.  PROGNOSIS:  The patient will be weightbearing as tolerated with physical therapy beginning DVT prophylaxis as soon as deemed stable by the Primary Care Service.  She has no range of motion precautions.  We will continue to follow through at the hospital.  Anticipate follow up in the office in 2 weeks for removal of sutures and further evaluation.     Astrid Divine. Marcelino Scot, M.D.

## 2020-03-05 NOTE — Progress Notes (Signed)
PROGRESS NOTE    HASEL [name]  CBJ:[id number] DOB: [birth year] DOA: 03/04/2020 PCP: Celene Squibb, MD   Brief Narrative:  HPI: Ana [name] is a 74 y.o. female with medical history significant for hypertension, prosthetic left eye, aortic valve disease ([employer] AVR 1998), chronic Lyme disease, CVA with left-sided weakness who presents to the emergency department due to a fall sustained at home yesterday (10/20).  Patient states that she had a stroke about 3 weeks ago.  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  She denies chest pain, shortness of breath, headache, blurry vision, nausea or vomiting.  ED Course: In the emergency department, BP was elevated at 182/86, but other vital signs are within normal range.  Work-up in the ED showed leukocytosis, thrombocytosis, hyperglycemia, urinalysis was positive for glucosuria and ketonuria.  Chest x-ray showed emphysematous and chronic bronchitis changes in the lungs with no evidence of active pulmonary disease.  Left hip x-ray showed acute comminuted intertrochanteric fracture of the proximal left femur with varus angulation.  She was treated with IV fentanyl 61mcg x 1.  Orthopedic surgeon was consulted per ED physician. Hospitalist was asked to admit patient for further evaluation management.  Assessment & Plan:   Principal Problem:   Closed comminuted intertrochanteric fracture of proximal femur, left, initial encounter [facility]) Active Problems:   Essential hypertension   Thrombocytosis   CVA (cerebral vascular accident) ([place])   Leukocytosis   Fall at home, initial encounter    Hyperglycemia   Closed left femoral fracture ([place])   Close comminuted intertrochanteric fracture of left proximal femur secondary to accidental fall: Seen by orthopedics.  Plan for IM N today.  Management deferred to orthopedics.  History of CVA with left-sided weakness:  Patient had a small recent (late acute to subacute) infarcts and right middle cerebral peduncle at the cortex of the posterior left frontal/parietal region. Continue PT/OT eval and treat  Leukocytosis possibly reactive: Improved from 22.3> 14.9 today. No acute source of any infection at this time Continue to monitor for acute infectious process and treat accordingly.  Thrombocytosis (chronic): Stable.  Monitor.  Hyperglycemia possibly reactive; patient without any history of type 2 diabetes mellitus Blood glucose at 161 at the time of admission.  Will check hemoglobin A1c.  Essential hypertension (uncontrolled): BP 182/77 yesterday, possibly due to uncontrolled pain and patient did not take BP meds yesterday but blood pressure much better today.  Continue home dose of amlodipine.  DVT prophylaxis: SCDs Start: 03/04/20 1607   Code Status: Full Code  Family Communication:  None present at bedside.  Plan of care discussed with patient in length and he verbalized understanding and agreed with it.  Status is: Inpatient  Remains inpatient appropriate because:Inpatient level of care appropriate due to severity of illness   Dispo: The patient is from: Home              Anticipated d/c is to: SNF              Anticipated d/c date is: 3 days  Patient currently is not medically stable to d/c.        Estimated body mass index is 22.31 kg/m as calculated from the following:   Height as of this encounter: 5\' 4"  (1.626 m).   Weight as of this encounter: 59 kg.      Nutritional status:               Consultants:   Orthopedic  Procedures:   None  Antimicrobials:  Anti-infectives  (From admission, onward)   Start     Dose/Rate Route Frequency Ordered Stop   03/05/20 0600  ceFAZolin (ANCEF) IVPB 2g/100 mL premix        2 g 200 mL/hr over 30 Minutes Intravenous To Short Stay 03/04/20 2242 03/05/20 1100         Subjective: Seen and examined.  Complains of hip pain.  Slightly groggy.   Objective: Vitals:   03/05/20 1224 03/05/20 1239 03/05/20 1254 03/05/20 1300  BP: (!) 159/49 (!) 109/50 (!) 114/49   Pulse: 76 73 74 73  Resp: 14 16 16 15   Temp: 98.2 F (36.8 C)     TempSrc:      SpO2: 95% 94% 90% 97%  Weight:      Height:        Intake/Output Summary (Last 24 hours) at 03/05/2020 1316 Last data filed at 03/05/2020 1224 Gross per 24 hour  Intake 1447.32 ml  Output 2600 ml  Net -1152.68 ml   Filed Weights   03/04/20 0229  Weight: 59 kg    Examination:  General exam: Appears calm and comfortable but groggy Respiratory system: Clear to auscultation. Respiratory effort normal. Cardiovascular system: S1 & S2 heard, RRR. No JVD, murmurs, rubs, gallops or clicks. No pedal edema. Gastrointestinal system: Abdomen is nondistended, soft and nontender. No organomegaly or masses felt. Normal bowel sounds heard. Central nervous system: Groggy and oriented x3. No focal neurological deficits. Skin: No rashes, lesions or ulcers Psychiatry: Judgement and insight appear normal. Mood & affect appropriate.    Data Reviewed: I have personally reviewed following labs and imaging studies  CBC: Recent Labs  Lab 03/04/20 0459 03/05/20 0743  WBC 22.3* 14.9*  NEUTROABS 19.7* 11.1*  HGB 14.0 13.0  HCT 42.0 40.2  MCV 89.4 91.4  PLT 1,054* 599*   Basic Metabolic Panel: Recent Labs  Lab 03/04/20 0459 03/05/20 0743  NA 136 137  K 4.0 3.9  CL 100 102  CO2 25 25  GLUCOSE 161* 118*  BUN 26* 13  CREATININE 0.67 0.72  CALCIUM 9.0 8.6*   GFR: Estimated Creatinine Clearance: 54.1 mL/min (by C-G formula based on SCr of 0.72 mg/dL). Liver Function  Tests: Recent Labs  Lab 03/04/20 0459  AST 28  ALT 24  ALKPHOS 53  BILITOT 0.7  PROT 7.5  ALBUMIN 4.4   No results for input(s): LIPASE, AMYLASE in the last 168 hours. No results for input(s): AMMONIA in the last 168 hours. Coagulation Profile: Recent Labs  Lab 03/04/20 0459 03/05/20 0743  INR 2.6* 1.3*   Cardiac Enzymes: No results for input(s): CKTOTAL, CKMB, CKMBINDEX, TROPONINI in the last 168 hours. BNP (last 3 results) No results for input(s): PROBNP in the last 8760 hours. HbA1C: No results for input(s): HGBA1C in the last 72 hours. CBG: No results for input(s): GLUCAP in the last 168 hours. Lipid Profile: No results for input(s): CHOL, HDL, LDLCALC, TRIG, CHOLHDL, LDLDIRECT in the last 72 hours. Thyroid Function Tests: No results for input(s): TSH, T4TOTAL, FREET4,  T3FREE, THYROIDAB in the last 72 hours. Anemia Panel: No results for input(s): VITAMINB12, FOLATE, FERRITIN, TIBC, IRON, RETICCTPCT in the last 72 hours. Sepsis Labs: No results for input(s): PROCALCITON, LATICACIDVEN in the last 168 hours.  Recent Results (from the past 240 hour(s))  Respiratory Panel by RT PCR (Flu A&B, Covid) - Nasopharyngeal Swab     Status: None   Collection Time: 03/04/20  5:43 AM   Specimen: Nasopharyngeal Swab  Result Value Ref Range Status   SARS Coronavirus 2 by RT PCR NEGATIVE NEGATIVE Final    Comment: (NOTE) SARS-CoV-2 target nucleic acids are NOT DETECTED.  The SARS-CoV-2 RNA is generally detectable in upper respiratoy specimens during the acute phase of infection. The lowest concentration of SARS-CoV-2 viral copies this assay can detect is 131 copies/mL. A negative result does not preclude SARS-Cov-2 infection and should not be used as the sole basis for treatment or other patient management decisions. A negative result may occur with  improper specimen collection/handling, submission of specimen other than nasopharyngeal swab, presence of viral mutation(s) within  the areas targeted by this assay, and inadequate number of viral copies (<131 copies/mL). A negative result must be combined with clinical observations, patient history, and epidemiological information. The expected result is Negative.  Fact Sheet for Patients:  PinkCheek.be  Fact Sheet for Healthcare Providers:  GravelBags.it  This test is no t yet approved or cleared by the [place] FDA and  has been authorized for detection and/or diagnosis of SARS-CoV-2 by FDA under an Emergency Use Authorization (EUA). This EUA will remain  in effect (meaning this test can be used) for the duration of the COVID-19 declaration under Section 564(b)(1) of the Act, 21 U.S.C. section 360bbb-3(b)(1), unless the authorization is terminated or revoked sooner.     Influenza A by PCR NEGATIVE NEGATIVE Final   Influenza B by PCR NEGATIVE NEGATIVE Final    Comment: (NOTE) The Xpert Xpress SARS-CoV-2/FLU/RSV assay is intended as an aid in  the diagnosis of influenza from Nasopharyngeal swab specimens and  should not be used as a sole basis for treatment. Nasal washings and  aspirates are unacceptable for Xpert Xpress SARS-CoV-2/FLU/RSV  testing.  Fact Sheet for Patients: PinkCheek.be  Fact Sheet for Healthcare Providers: GravelBags.it  This test is not yet approved or cleared by the [place] FDA and  has been authorized for detection and/or diagnosis of SARS-CoV-2 by  FDA under an Emergency Use Authorization (EUA). This EUA will remain  in effect (meaning this test can be used) for the duration of the  Covid-19 declaration under Section 564(b)(1) of the Act, 21  U.S.C. section 360bbb-3(b)(1), unless the authorization is  terminated or revoked. Performed at [facility], [address] [place], [place] [address]   Surgical pcr screen     Status: None   Collection Time:  03/04/20 11:48 PM   Specimen: Nasal Mucosa; Nasal Swab  Result Value Ref Range Status   MRSA, PCR NEGATIVE NEGATIVE Final   Staphylococcus aureus NEGATIVE NEGATIVE Final    Comment: (NOTE) The Xpert SA Assay (FDA approved for NASAL specimens in patients 16 years of age and older), is one component of a comprehensive surveillance program. It is not intended to diagnose infection nor to guide or monitor treatment. Performed at [facility], [place] [address], [place], [place] [address]       Radiology Studies: CT Head Wo Contrast  Result Date: 03/04/2020 CLINICAL DATA:  Reported recent CVA.  Fall. EXAM: CT HEAD  WITHOUT CONTRAST TECHNIQUE: Contiguous axial images were obtained from the base of the skull through the vertex without intravenous contrast. COMPARISON:  Brain MRI February 16, 2017 FINDINGS: Brain: There is a degree of cerebellar atrophy. Ventricles and sulci elsewhere are commensurate with age. There is no intracranial mass, hemorrhage, extra-axial fluid collection, or midline shift. There is evidence of prior infarct in the right posterior inferior cerebellar region. There is decreased attenuation in a portion of the right dentate nucleus which potentially could represent recent infarct. There is evidence of a prior infarct in the anterior left centrum semiovale. There is patchy small vessel disease in the centra semiovale as well. Vascular: No appreciable hyperdense vessel. There is calcification in each carotid siphon region. Skull: The bony calvarium appears intact. Sinuses/Orbits: There is mucosal thickening in several ethmoid air cells. There is a prosthetic globe on the left. There is a cystic appearing area posterior to the prosthesis in the left lobe region measuring 1.8 x 1.8 cm. The right orbit appears normal. Other: Mastoid air cells are clear. IMPRESSION: 1. Age uncertain but potentially recent infarct in the right cerebellar dentate nucleus. Nearby older infarct in the  posteroinferior cerebellar region, present on prior MR examination. 2. Prior infarct in the anterior left centrum semiovale. There is mild patchy small vessel disease in the centra semiovale bilaterally. 3.  No mass or hemorrhage. 4.  There are foci of arterial vascular calcification. 5. 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 Cystic appearing area is located in the left orbit immediately posterior to the prosthesis on the left of uncertain etiology. 6.  Mucosal thickening in several ethmoid air cells noted. Electronically Signed   By: Lowella Grip III M.D.   On: 03/04/2020 08:35   DG Chest Port 1 View  Result Date: 03/04/2020 CLINICAL DATA:  Preoperative for left hip fracture. History of hypertension, aortic valve replacement, former smoker. EXAM: PORTABLE CHEST 1 VIEW COMPARISON:  04/23/2017 FINDINGS: Postoperative changes in the mediastinum. Normal heart size and pulmonary vascularity. Emphysematous changes in the lungs. Diffuse interstitial pattern to the lungs with peribronchial thickening likely due to chronic bronchitis. No pleural effusions. No focal consolidation. No pneumothorax. Mediastinal contours appear intact. Calcification of the aorta. IMPRESSION: Emphysematous and chronic bronchitic changes in the lungs. No evidence of active pulmonary disease. Electronically Signed   By: Lucienne Capers M.D.   On: 03/04/2020 04:26   DG C-Arm 1-60 Min  Result Date: 03/05/2020 CLINICAL DATA:  Left femoral fracture, ORIF EXAM: DG C-ARM 1-60 MIN; LEFT FEMUR 2 VIEWS CONTRAST:  None FLUOROSCOPY TIME:  Fluoroscopy Time:  1 minutes 39 seconds Radiation Exposure Index (if provided by the fluoroscopic device): 13.51 mGy Number of Acquired Spot Images: 8 COMPARISON:  03/04/2020 FINDINGS: Eight fluoroscopic intraoperative radiographs demonstrate open reduction internal fixation of a intratrochanteric left femoral neck fracture with a a long intramedullary rod and axial screw. No unexpected fracture or dislocation.  Normal alignment on this limited examination. IMPRESSION: Left femoral neck ORIF.  No unexpected fracture or dislocation. Electronically Signed   By: Fidela Salisbury MD   On: 03/05/2020 12:51   DG Hip Unilat W or Wo Pelvis 2-3 Views Left  Result Date: 03/04/2020 CLINICAL DATA:  Hip pain after a fall last night. EXAM: DG HIP (WITH OR WITHOUT PELVIS) 2-3V LEFT COMPARISON:  None. FINDINGS: There is an acute comminuted inter trochanteric fracture of the proximal left femur with varus angulation. No dislocation at the hip joint. Degenerative changes are seen in both hips. Pelvis appears intact. IMPRESSION:  Acute comminuted intertrochanteric fracture of the proximal left femur with varus angulation. Electronically Signed   By: Lucienne Capers M.D.   On: 03/04/2020 03:53   DG FEMUR MIN 2 VIEWS LEFT  Result Date: 03/05/2020 CLINICAL DATA:  Left femoral fracture, ORIF EXAM: DG C-ARM 1-60 MIN; LEFT FEMUR 2 VIEWS CONTRAST:  None FLUOROSCOPY TIME:  Fluoroscopy Time:  1 minutes 39 seconds Radiation Exposure Index (if provided by the fluoroscopic device): 13.51 mGy Number of Acquired Spot Images: 8 COMPARISON:  03/04/2020 FINDINGS: Eight fluoroscopic intraoperative radiographs demonstrate open reduction internal fixation of a intratrochanteric left femoral neck fracture with a a long intramedullary rod and axial screw. No unexpected fracture or dislocation. Normal alignment on this limited examination. IMPRESSION: Left femoral neck ORIF.  No unexpected fracture or dislocation. Electronically Signed   By: Fidela Salisbury MD   On: 03/05/2020 12:51    Scheduled Meds: . [MAR Hold] amLODipine  10 mg Oral QPM  . dexmedetomidine      . [MAR Hold] magnesium oxide  400 mg Oral Daily  . [MAR Hold] melatonin  3 mg Oral QHS  . [MAR Hold] oyster calcium  500 mg of elemental calcium Oral BID  . povidone-iodine  2 application Topical Once   Continuous Infusions: . sodium chloride 75 mL/hr at 03/04/20 2324  . lactated  ringers 10 mL/hr at 03/05/20 1008     LOS: 1 day   Time spent: 35 minutes   Darliss Cheney, MD [facility]  03/05/2020, 1:16 PM   To contact the attending provider between 7A-7P or the covering provider during after hours 7P-7A, please log into the web site [email or web address].

## 2020-03-05 NOTE — Anesthesia Postprocedure Evaluation (Signed)
Anesthesia Post Note  Patient: Ana [name]  Procedure(s) Performed: INTRAMEDULLARY (IM) NAIL INTERTROCHANTRIC (Left )     Patient location during evaluation: PACU Anesthesia Type: General Level of consciousness: awake and alert Pain management: pain level controlled Vital Signs Assessment: post-procedure vital signs reviewed and stable Respiratory status: spontaneous breathing, nonlabored ventilation, respiratory function stable and patient connected to nasal cannula oxygen Cardiovascular status: blood pressure returned to baseline and stable Postop Assessment: no apparent nausea or vomiting Anesthetic complications: no   No complications documented.  Last Vitals:  Vitals:   03/05/20 1400 03/05/20 1415  BP: (!) 118/56 (!) 135/52  Pulse: 65 73  Resp: 15 16  Temp:  37.1 C  SpO2: 92% 98%    Last Pain:  Vitals:   03/05/20 1725  TempSrc:   PainSc: 6                  Mariabella Nilsen COKER

## 2020-03-05 NOTE — Transfer of Care (Signed)
Immediate Anesthesia Transfer of Care Note  Patient: Ana [name]  Procedure(s) Performed: INTRAMEDULLARY (IM) NAIL INTERTROCHANTRIC (Left )  Patient Location: PACU  Anesthesia Type:General  Level of Consciousness: drowsy  Airway & Oxygen Therapy: Patient Spontanous Breathing and Patient connected to face mask oxygen  Post-op Assessment: Report given to RN and Post -op Vital signs reviewed and stable  Post vital signs: Reviewed and stable  Last Vitals:  Vitals Value Taken Time  BP 160/75 03/05/20 1222  Temp    Pulse 83 03/05/20 1224  Resp 19 03/05/20 1224  SpO2 97 % 03/05/20 1224  Vitals shown include unvalidated device data.  Last Pain:  Vitals:   03/05/20 0831  TempSrc: Oral  PainSc:       Patients Stated Pain Goal: 3 (18/48/59 2763)  Complications: No complications documented.

## 2020-03-05 NOTE — Anesthesia Preprocedure Evaluation (Signed)
Anesthesia Evaluation  Patient identified by MRN, date of birth, ID band Patient awake    Reviewed: Allergy & Precautions, NPO status , Patient's Chart, lab work & pertinent test results  Airway Mallampati: II  TM Distance: >3 FB Neck ROM: Full    Dental  (+) Teeth Intact   Pulmonary former smoker,    breath sounds clear to auscultation       Cardiovascular hypertension,  Rhythm:Regular Rate:Normal + Systolic Click    Neuro/Psych    GI/Hepatic   Endo/Other    Renal/GU      Musculoskeletal   Abdominal   Peds  Hematology   Anesthesia Other Findings   Reproductive/Obstetrics                             Anesthesia Physical Anesthesia Plan  ASA: III  Anesthesia Plan: General   Post-op Pain Management:    Induction: Intravenous  PONV Risk Score and Plan: Ondansetron and Dexamethasone  Airway Management Planned: Oral ETT  Additional Equipment:   Intra-op Plan:   Post-operative Plan: Extubation in OR  Informed Consent: I have reviewed the patients History and Physical, chart, labs and discussed the procedure including the risks, benefits and alternatives for the proposed anesthesia with the patient or authorized representative who has indicated his/her understanding and acceptance.     Dental advisory given  Plan Discussed with: CRNA and Anesthesiologist  Anesthesia Plan Comments:         Anesthesia Quick Evaluation

## 2020-03-05 NOTE — Progress Notes (Addendum)
OT Cancellation Note  Patient Details Name: Ana [name] MRN: [id number] DOB: [birth year]   Cancelled Treatment:    Reason Eval/Treat Not Completed: Patient at procedure or test/ unavailable. Pt undergoing surgical intervention for hip fx this AM. Will re-attempt OT eval after surgery.   Layla Maw 03/05/2020, 12:49 PM

## 2020-03-05 NOTE — Progress Notes (Signed)
PT Cancellation Note  Patient Details Name: Ana [name] MRN: [id number] DOB: May 18, 1945   Cancelled Treatment:    Reason Eval/Treat Not Completed: Patient at procedure or test/unavailable (to OR for L hip IMN). Will follow-up for PT evaluation as schedule permits.  Mabeline Caras, PT, DPT Acute Rehabilitation Services  Pager [phone] Office [place] 03/05/2020, 9:56 AM

## 2020-03-05 NOTE — Progress Notes (Signed)
RN went in to assess pt before surgery and pt was snoring soundly and difficult to arouse. Vital signs at time were stable and documented. MD made aware with orders to monitor. Ortho PA came to talk with pt about surgery today and pt was still difficult to arouse. Orders for narcan to be given. Narcan given and documented on MAR, was effective. Pt awake, alert, and oriented.
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
## 2020-03-05 NOTE — Anesthesia Procedure Notes (Signed)
Procedure Name: Intubation Date/Time: 03/05/2020 10:23 AM Performed by: Babs Bertin, CRNA Pre-anesthesia Checklist: Patient identified, Emergency Drugs available, Suction available and Patient being monitored Patient Re-evaluated:Patient Re-evaluated prior to induction Oxygen Delivery Method: Circle System Utilized Preoxygenation: Pre-oxygenation with 100% oxygen Induction Type: IV induction Ventilation: Mask ventilation without difficulty Laryngoscope Size: Mac and 3 Grade View: Grade II Tube type: Oral Tube size: 7.0 mm Number of attempts: 1 Airway Equipment and Method: Stylet and Oral airway Placement Confirmation: ETT inserted through vocal cords under direct vision,  positive ETCO2 and breath sounds checked- equal and bilateral Secured at: 21 cm Tube secured with: Tape Dental Injury: Teeth and Oropharynx as per pre-operative assessment

## 2020-03-06 DIAGNOSIS — S72142A Displaced intertrochanteric fracture of left femur, initial encounter for closed fracture: Secondary | ICD-10-CM | POA: Diagnosis not present

## 2020-03-06 LAB — CBC
HCT: 36.2 % (ref 36.0–46.0)
Hemoglobin: 11.8 g/dL — ABNORMAL LOW (ref 12.0–15.0)
MCH: 29.9 pg (ref 26.0–34.0)
MCHC: 32.6 g/dL (ref 30.0–36.0)
MCV: 91.9 fL (ref 80.0–100.0)
Platelets: 720 10*3/uL — ABNORMAL HIGH (ref 150–400)
RBC: 3.94 MIL/uL (ref 3.87–5.11)
RDW: 14.2 % (ref 11.5–15.5)
WBC: 12.6 10*3/uL — ABNORMAL HIGH (ref 4.0–10.5)
nRBC: 0 % (ref 0.0–0.2)

## 2020-03-06 LAB — BASIC METABOLIC PANEL
Anion gap: 6 (ref 5–15)
BUN: 10 mg/dL (ref 8–23)
CO2: 30 mmol/L (ref 22–32)
Calcium: 8.8 mg/dL — ABNORMAL LOW (ref 8.9–10.3)
Chloride: 103 mmol/L (ref 98–111)
Creatinine, Ser: 0.65 mg/dL (ref 0.44–1.00)
GFR, Estimated: >60 mL/min (ref >60)
Glucose, Bld: 131 mg/dL — ABNORMAL HIGH (ref 70–99)
Potassium: 3.8 mmol/L (ref 3.5–5.1)
Sodium: 139 mmol/L (ref 135–145)

## 2020-03-06 LAB — VITAMIN D 25 HYDROXY (VIT D DEFICIENCY, FRACTURES): Vit D, 25-Hydroxy: 56.24 ng/mL (ref 30–100)

## 2020-03-06 LAB — HEMOGLOBIN A1C
Hgb A1c MFr Bld: 5.6 % (ref 4.8–5.6)
Mean Plasma Glucose: 114 mg/dL

## 2020-03-06 LAB — PROTIME-INR
INR: 1.3 — ABNORMAL HIGH (ref 0.8–1.2)
Prothrombin Time: 15.7 seconds — ABNORMAL HIGH (ref 11.4–15.2)

## 2020-03-06 MED ORDER — WARFARIN - PHARMACIST DOSING INPATIENT: Freq: Every day | Status: DC

## 2020-03-06 MED ORDER — WARFARIN SODIUM 5 MG PO TABS
10.0000 mg | ORAL_TABLET | Freq: Once | ORAL | Status: AC
  Administered 2020-03-06: 10 mg via ORAL
  Filled 2020-03-06: qty 2

## 2020-03-06 NOTE — Plan of Care (Signed)
  Problem: Activity: Goal: Ability to ambulate and perform ADLs will improve Outcome: Progressing   Problem: Pain Management: Goal: Pain level will decrease Outcome: Progressing   Problem: Nutrition: Goal: Adequate nutrition will be maintained Outcome: Progressing   Problem: Pain Managment: Goal: General experience of comfort will improve Outcome: Progressing   Problem: Safety: Goal: Ability to remain free from injury will improve Outcome: Progressing

## 2020-03-06 NOTE — Evaluation (Signed)
Physical Therapy Evaluation Patient Details Name: Ana [name] MRN: [id number] DOB: 07/04/1945 Today's Date: 03/06/2020   History of Present Illness  Ana [name] is a 74 y.o. female with medical history significant for hypertension, prosthetic left eye, aortic valve disease ([place] AVR 1998), chronic Lyme disease, CVA with left-sided weakness who presents to the emergency department due to a fall sustained at home yesterday (10/20). she states that she was in a warehouse with her son, she lost her balance and fell landing on a concrete floor with her left hip without hitting her head or losing consciousness, however, she complained of severe left hip pain and difficulty in being able to bear weight on the left hip.  . Pt found to have hip fx and underwent IM nailing on 10/23.    Clinical Impression  Patient received in bed. States "I doubt it" when asked if she could possibly get up to recliner. Patient reports severe pain with minimal movement. Requires increased time with all mobility due to wanting to move at very slow pace and take multiple rest breaks. She required max assist +2 for supine >< sit. Min +2 assist for sit to stand and she was able to take a few side steps along edge of bed. Patient will require encouragement to progress mobility. She will continue to benefit from skilled PT while here to improve strength, functional independence and safety.        Follow Up Recommendations SNF    Equipment Recommendations  None recommended by PT;Other (comment) (TBD)    Recommendations for Other Services       Precautions / Restrictions Precautions Precautions: Fall Restrictions Weight Bearing Restrictions: Yes LLE Weight Bearing: Weight bearing as tolerated      Mobility  Bed Mobility Overal bed mobility: Needs Assistance Bed Mobility: Supine to Sit;Sit to Supine     Supine to sit: Max assist;+2 for physical assistance;HOB elevated Sit to supine: Max assist;+2 for physical  assistance;HOB elevated   General bed mobility comments: Max A x 2 with increased time/effort with pt frequently requesting rest breaks due to lyme disease. Heavy assist for trunk and advancement of operated leg Patient Response: Anxious  Transfers Overall transfer level: Needs assistance Equipment used: Rolling walker (2 wheeled) Transfers: Sit to/from Stand Sit to Stand: Min assist;+2 physical assistance;From elevated surface         General transfer comment: Pt demo Min A x 2 for power up, cued for hand placement. Pt able to take side steps to [facility] with Min A x 2 and assistance to manuever RW. Pt declined further mobility or transfer to chair at this time  Ambulation/Gait Ambulation/Gait assistance: Min assist;+2 physical assistance Gait Distance (Feet): 3 Feet Assistive device: Rolling walker (2 wheeled) Gait Pattern/deviations: Step-to pattern;Decreased stride length;Shuffle Gait velocity: decr   General Gait Details: able to take side steps along edge of bed with cues and assist to advance RW  Stairs            Wheelchair Mobility    Modified Rankin (Stroke Patients Only)       Balance Overall balance assessment: Needs assistance;History of Falls Sitting-balance support: Feet supported Sitting balance-Leahy Scale: Good     Standing balance support: Bilateral upper extremity supported;During functional activity Standing balance-Leahy Scale: Fair Standing balance comment: reliant on UE support                             Pertinent  Vitals/Pain Pain Assessment: Faces Faces Pain Scale: Hurts even more Pain Location: R hip with movement, even when adjusting bed position Pain Descriptors / Indicators: Discomfort;Grimacing;Operative site guarding;Guarding Pain Intervention(s): Monitored during session;Limited activity within patient's tolerance;Repositioned;Premedicated before session    [place] expects to be discharged to::  Skilled nursing facility Living Arrangements: Children Available Help at Discharge: Family Type of Home: House Home Access: Level entry     Home Layout: One level Home Equipment: None Additional Comments: Son works    Prior Function Level of Independence: Needs assistance   Gait / Transfers Assistance Needed: able to walk without AD. Pt denies other falls aside from surgical site  ADL's / Homemaking Assistance Needed: Able to complete ADLs without assist. Son does cooking. Pt reports "I can only do one thing a day" when asked about iADLs secondary to fatigue from lyme disease        Hand Dominance   Dominant Hand: Right    Extremity/Trunk Assessment   Upper Extremity Assessment Upper Extremity Assessment: Generalized weakness    Lower Extremity Assessment Lower Extremity Assessment: LLE deficits/detail LLE: Unable to fully assess due to pain LLE Coordination: decreased gross motor    Cervical / Trunk Assessment Cervical / Trunk Assessment: Kyphotic  Communication   Communication: No difficulties  Cognition Arousal/Alertness: Awake/alert Behavior During Therapy: Agitated Overall Cognitive Status: No family/caregiver present to determine baseline cognitive functioning Area of Impairment: Attention;Following commands;Memory;Safety/judgement;Problem solving;Awareness                   Current Attention Level: Sustained Memory: Decreased short-term memory Following Commands: Follows one step commands with increased time Safety/Judgement: Decreased awareness of safety;Decreased awareness of deficits Awareness: Emergent Problem Solving: Decreased initiation;Difficulty sequencing;Requires verbal cues;Requires tactile cues General Comments: Pt with frequent tangential conversation, perseverating on lyme disease as the reason behind difficulties participating with therapy. Pt with decreased awareness of deficits, but aware of need for more rehab prior to return home.  Pt with intermittent agitation (reports due to lyme disease)       General Comments General comments (skin integrity, edema, etc.): Pt received on 1 L O2 post op, SpO2 on monitor 90%. With therapist, pulse ox SpO2 95%. Pt decided to remove sensor and O2. RN aware    Exercises     Assessment/Plan    PT Assessment Patient needs continued PT services  PT Problem List Decreased strength;Decreased mobility;Decreased activity tolerance;Decreased balance;Decreased knowledge of use of DME;Pain;Decreased knowledge of precautions;Decreased safety awareness       PT Treatment Interventions DME instruction;Therapeutic activities;Gait training;Therapeutic exercise;Patient/family education;Balance training;Functional mobility training;Neuromuscular re-education    PT Goals (Current goals can be found in the Care Plan section)  Acute Rehab PT Goals Patient Stated Goal: pain control, be able to rest PT Goal Formulation: With patient Time For Goal Achievement: 03/19/20 Potential to Achieve Goals: Good    Frequency Min 3X/week   Barriers to discharge Decreased caregiver support      Co-evaluation PT/OT/SLP Co-Evaluation/Treatment: Yes Reason for Co-Treatment: For patient/therapist safety;To address functional/ADL transfers;Necessary to address cognition/behavior during functional activity PT goals addressed during session: Mobility/safety with mobility;Balance OT goals addressed during session: ADL's and self-care;Other (comment) (transfers)       AM-PAC PT "6 Clicks" Mobility  Outcome Measure Help needed turning from your back to your side while in a flat bed without using bedrails?: A Lot Help needed moving from lying on your back to sitting on the side of a flat bed without using bedrails?: A  Lot Help needed moving to and from a bed to a chair (including a wheelchair)?: A Lot Help needed standing up from a chair using your arms (e.g., wheelchair or bedside chair)?: A Little Help needed  to walk in hospital room?: A Lot Help needed climbing 3-5 steps with a railing? : Total 6 Click Score: 12    End of Session Equipment Utilized During Treatment: Gait belt;Oxygen Activity Tolerance: Patient limited by pain;Patient limited by fatigue Patient left: in bed;with call bell/phone within reach;with bed alarm set;with SCD's reapplied Nurse Communication: Mobility status PT Visit Diagnosis: Muscle weakness (generalized) (M62.81);History of falling (Z91.81);Other abnormalities of gait and mobility (R26.89);Pain;Difficulty in walking, not elsewhere classified (R26.2) Pain - Right/Left: Left Pain - part of body: Leg    Time: 9791-5041 PT Time Calculation (min) (ACUTE ONLY): 33 min   Charges:   PT Evaluation $PT Eval Moderate Complexity: 1 Mod          Hersh Minney, PT, GCS 03/06/20,1:59 PM

## 2020-03-06 NOTE — Plan of Care (Signed)

## 2020-03-06 NOTE — Plan of Care (Signed)
  Problem: Education: Goal: Verbalization of understanding the information provided (i.e., activity precautions, restrictions, etc) will improve Outcome: Progressing Goal: Individualized Educational Video(s) Outcome: Progressing   Problem: Education: Goal: Individualized Educational Video(s) Outcome: Progressing   Problem: Activity: Goal: Ability to ambulate and perform ADLs will improve Outcome: Progressing   Problem: Clinical Measurements: Goal: Postoperative complications will be avoided or minimized Outcome: Progressing   Problem: Self-Concept: Goal: Ability to maintain and perform role responsibilities to the fullest extent possible will improve Outcome: Progressing

## 2020-03-06 NOTE — Evaluation (Signed)
Occupational Therapy Evaluation Patient Details Name: BRITTAINY BUCKER MRN: [id number] DOB: 1945-09-22 Today's Date: 03/06/2020    History of Present Illness AGNES BRIGHTBILL is a 74 y.o. female with medical history significant for hypertension, prosthetic left eye, aortic valve disease ([place] AVR 1998), chronic Lyme disease, CVA with left-sided weakness who presents to the emergency department due to a fall sustained at home yesterday (10/20). she states that she was in a warehouse with her son, she lost her balance and fell landing on a concrete floor with her left hip without hitting her head or losing consciousness, however, she complained of severe left hip pain and difficulty in being able to bear weight on the left hip.  . Pt found to have hip fx and underwent IM nailing on 10/23.   Clinical Impression   PTA, pt lives with son and reports Independence with ADLs and mobility, but difficulty completing IADLs without assist due to chronic fatigue from Lyme disease. Pt presents now s/p surgery with deficits in endurance, safety, balance, strength and cognition. Pt perseverating throughout session on Lyme disease hindering her ability to complete tasks, along with R hip pain. Pt overall Max A x 2 for bed mobility to sit EOB, then demonstrated ability to stand and side step up to [facility] with RW and Min A x 2. Pt requires Min A for UB ADLs, Max A for LB ADLs due to deficits. Recommend SNF for short term rehab as pt unable to care for self and poses a high fall risk. Plan to progress endurance and OOB ADL activities during next session.     Follow Up Recommendations  SNF;Supervision/Assistance - 24 hour    Equipment Recommendations  3 in 1 bedside commode;Other (comment) (RW)    Recommendations for Other Services       Precautions / Restrictions Precautions Precautions: Fall Restrictions Weight Bearing Restrictions: Yes LLE Weight Bearing: Weight bearing as tolerated      Mobility Bed  Mobility Overal bed mobility: Needs Assistance Bed Mobility: Supine to Sit;Sit to Supine     Supine to sit: Max assist;+2 for physical assistance;+2 for safety/equipment;HOB elevated Sit to supine: Max assist;+2 for physical assistance;+2 for safety/equipment   General bed mobility comments: Max A x 2 with increased time/effort with pt frequently requesting rest breaks due to lyme disease. Heavy assist for trunk and advancement of operated leg    Transfers Overall transfer level: Needs assistance Equipment used: Rolling walker (2 wheeled) Transfers: Sit to/from Stand Sit to Stand: Min assist;+2 safety/equipment;From elevated surface         General transfer comment: Pt demo Min A x 2 for power up, cued for hand placement. Pt able to take side steps to [facility] with Min A x 2 and assistance to manuever RW. Pt declined further mobility or transfer to chair at this time    Balance Overall balance assessment: Needs assistance;History of Falls Sitting-balance support: No upper extremity supported;Feet supported Sitting balance-Leahy Scale: Fair     Standing balance support: Bilateral upper extremity supported;During functional activity Standing balance-Leahy Scale: Poor Standing balance comment: reliant on UE support                           ADL either performed or assessed with clinical judgement   ADL Overall ADL's : Needs assistance/impaired Eating/Feeding: Set up;Sitting   Grooming: Set up;Sitting   Upper Body Bathing: Minimal assistance;Sitting   Lower Body Bathing: Maximal assistance;Sit to/from stand  Upper Body Dressing : Minimal assistance;Sitting   Lower Body Dressing: Maximal assistance;Sit to/from stand   Toilet Transfer: Minimal assistance;BSC;RW;Stand-pivot   Toileting- Clothing Manipulation and Hygiene: Maximal assistance;Sit to/from stand       Functional mobility during ADLs: Minimal assistance;+2 for physical assistance;+2 for  safety/equipment;Rolling walker;Cueing for sequencing;Cueing for safety General ADL Comments: Pt with pain and reported lyme disease as limitations. 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) Patient Visual Report: No change from baseline Vision Assessment?: Vision impaired- to be further tested in functional context Additional Comments: hx of L eye visual impairment from lyme disease, causes issues with balance, depth perception     Perception     Praxis      Pertinent Vitals/Pain Pain Assessment: Faces Faces Pain Scale: Hurts little more Pain Location: R hip with movement Pain Descriptors / Indicators: Discomfort;Grimacing;Operative site guarding Pain Intervention(s): Limited activity within patient's tolerance;Monitored during session;Repositioned;Premedicated before session     Hand Dominance Right   Extremity/Trunk Assessment Upper Extremity Assessment Upper Extremity Assessment: Generalized weakness   Lower Extremity Assessment Lower Extremity Assessment: Defer to PT evaluation   Cervical / Trunk Assessment Cervical / Trunk Assessment: Kyphotic   Communication Communication Communication: No difficulties   Cognition Arousal/Alertness: Awake/alert Behavior During Therapy: Agitated;Anxious;Flat affect Overall Cognitive Status: Impaired/Different from baseline Area of Impairment: Attention;Following commands;Memory;Safety/judgement;Problem solving;Awareness                   Current Attention Level: Sustained Memory: Decreased short-term memory Following Commands: Follows one step commands with increased time Safety/Judgement: Decreased awareness of safety;Decreased awareness of deficits Awareness: Emergent Problem Solving: Decreased initiation;Difficulty sequencing;Requires verbal cues;Requires tactile cues General  Comments: Pt with frequent tangential conversation, perseverating on lyme disease as the reason behind difficulties participating with therapy. Pt with decreased awareness of deficits, but aware of need for more rehab prior to return home. Pt with intermittent agitation (reports due to lyme disease)    General Comments  Pt received on 1 L O2 post op, SpO2 on monitor 90%. With therapist, pulse ox SpO2 95%. Pt decided to remove sensor and O2. RN aware    Exercises     Shoulder Instructions      Home Living Family/patient expects to be discharged to:: Private residence Living Arrangements: Children (son) Available Help at Discharge: Family Type of Home: House Home Access: Level entry     Home Layout: One level     Bathroom Shower/Tub: Occupational psychologist: Standard (low toilet)     Home Equipment: None   Additional Comments: Son works      Prior Functioning/Environment Level of Independence: Needs assistance  Gait / Transfers Assistance Needed: able to walk without AD. Pt denies other falls aside from surgical site ADL's / Homemaking Assistance Needed: Able to complete ADLs without assist. Son does cooking. Pt reports "I can only do one thing a day" when asked about iADLs secondary to fatigue from lyme disease            OT Problem List: Decreased strength;Decreased activity tolerance;Impaired balance (sitting and/or standing);Decreased cognition;Decreased safety awareness;Decreased knowledge of use of DME or AE;Pain      OT Treatment/Interventions: Self-care/ADL training;Therapeutic exercise;Energy conservation;DME and/or AE instruction;Therapeutic activities;Patient/family education    OT Goals(Current goals can be found in the care plan section) Acute Rehab OT Goals Patient Stated Goal: pain  control, be able to rest OT Goal Formulation: With patient Time For Goal Achievement: 03/20/20 Potential to Achieve Goals: Good ADL Goals Pt Will Perform Grooming:  with modified independence;standing Pt Will Perform Lower Body Bathing: with min assist;sit to/from stand Pt Will Perform Lower Body Dressing: with min assist;sit to/from stand Pt Will Transfer to Toilet: with min guard assist;ambulating;bedside commode Pt Will Perform Toileting - Clothing Manipulation and hygiene: with min assist;sit to/from stand;sitting/lateral leans  OT Frequency: Min 2X/week   Barriers to D/C:            Co-evaluation PT/OT/SLP Co-Evaluation/Treatment: Yes Reason for Co-Treatment: For patient/therapist safety;Other (comment);To address functional/ADL transfers;Necessary to address cognition/behavior during functional activity (pain levels)   OT goals addressed during session: ADL's and self-care;Other (comment) (transfers)      AM-PAC OT "6 Clicks" Daily Activity     Outcome Measure Help from another person eating meals?: A Little Help from another person taking care of personal grooming?: A Little Help from another person toileting, which includes using toliet, bedpan, or urinal?: A Lot Help from another person bathing (including washing, rinsing, drying)?: A Lot Help from another person to put on and taking off regular upper body clothing?: A Little Help from another person to put on and taking off regular lower body clothing?: A Lot 6 Click Score: 15   End of Session Equipment Utilized During Treatment: Gait belt;Rolling walker;Oxygen Nurse Communication: Mobility status  Activity Tolerance: Patient limited by pain Patient left: in bed;with call bell/phone within reach;with bed alarm set  OT Visit Diagnosis: Unsteadiness on feet (R26.81);Other abnormalities of gait and mobility (R26.89);Muscle weakness (generalized) (M62.81);History of falling (Z91.81);Pain Pain - Right/Left: Left Pain - part of body: Hip;Leg                Time: 8032-1224 OT Time Calculation (min): 38 min Charges:  OT General Charges $OT Visit: 1 Visit OT Evaluation $OT Eval  Moderate Complexity: 1 Mod OT Treatments $Self Care/Home Management : 8-22 mins  Layla Maw, OTR/L  Layla Maw 03/06/2020, 12:31 PM

## 2020-03-06 NOTE — Progress Notes (Addendum)
PROGRESS NOTE    Ana [name]  XBD:[id number] DOB: 1945/06/07 DOA: 03/04/2020 PCP: Celene Squibb, MD   Brief Narrative:  HPI: Ana [name] is a 74 y.o. female with medical history significant for hypertension, prosthetic left eye, aortic valve disease (Saint Jude AVR 1998), chronic Lyme disease, CVA with left-sided weakness who presents to the emergency department due to a fall sustained at home yesterday (10/20).  Patient states that she had a stroke about 3 weeks ago.  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  She denies chest pain, shortness of breath, headache, blurry vision, nausea or vomiting.  ED Course: In the emergency department, BP was elevated at 182/86, but other vital signs are within normal range.  Work-up in the ED showed leukocytosis, thrombocytosis, hyperglycemia, urinalysis was positive for glucosuria and ketonuria.  Chest x-ray showed emphysematous and chronic bronchitis changes in the lungs with no evidence of active pulmonary disease.  Left hip x-ray showed acute comminuted intertrochanteric fracture of the proximal left femur with varus angulation.  She was treated with IV fentanyl 61mcg x 1.  Orthopedic surgeon was consulted per ED physician. Hospitalist was asked to admit patient for further evaluation management.  Assessment & Plan:   Principal Problem:   Closed comminuted intertrochanteric fracture of proximal femur, left, initial encounter [facility]) Active Problems:   Essential hypertension   Thrombocytosis   CVA (cerebral vascular accident) ([place])   Leukocytosis   Fall at home, initial encounter    Hyperglycemia   Closed left femoral fracture ([place])   Close comminuted intertrochanteric fracture of left proximal femur secondary to accidental fall: Seen by orthopedics. S/p IMN 03/05/2020. Pain controlled. Management deferred to orthopedics.  Acute blood loss anemia: Hemoglobin dropped from 14 preoperatively to 11.8 today. No indication of transfusion. CBC daily.  History of CVA with left-sided weakness:  Patient had a small recent (late acute to subacute) infarcts and right middle cerebral peduncle at the cortex of the posterior left frontal/parietal region. Continue PT/OT eval and treat. Patient does not have any residual deficit on exam.  Leukocytosis possibly reactive: Improved from 22.3> 14.9> 12.6 today. No acute source of any infection at this time Continue to monitor for acute infectious process and treat accordingly.  Thrombocytosis (chronic): Stable.  Monitor.  Hyperglycemia possibly reactive; patient without any history of type 2 diabetes mellitus. Hemoglobin A1c 5.6.  Essential hypertension (uncontrolled): Blood pressure much better controlled. Continue current regimen.   History of Saint Jude AVR: Patient on Coumadin prior to admission. This was held due to surgery. INR subtherapeutic. Coumadin resumed, pharmacy managing dose. Will consult pharmacy to initiate bridging heparin and continue until INR becomes therapeutic.  DVT prophylaxis: enoxaparin (LOVENOX) injection 30 mg Start: 03/06/20 1000 SCDs Start: 03/05/20 1426 SCDs Start: 03/04/20 0925   Code Status: Full Code  Family Communication:  None present at bedside.  Plan of care discussed with patient in length and he verbalized understanding and agreed with it.  Status is: Inpatient  Remains inpatient appropriate because:Inpatient level of care appropriate due to severity of illness   Dispo: The patient is from: Home  Anticipated d/c is to: SNF              Anticipated d/c date is: 3 days               Patient currently is not medically stable to d/c.        Estimated body mass index is 22.31 kg/m as calculated from the following:   Height as of this encounter: 5\' 4"  (1.626 m).   Weight as of this encounter: 59 kg.      Nutritional status:               Consultants:   Orthopedic  Procedures:   IMN  Antimicrobials:  Anti-infectives (From admission, onward)   Start     Dose/Rate Route Frequency Ordered Stop   03/05/20 0600  ceFAZolin (ANCEF) IVPB 2g/100 mL premix        2 g 200 mL/hr over 30 Minutes Intravenous To Short Stay 03/04/20 2242 03/05/20 1100         Subjective: Seen and examined. Complains of pain all over the body but looks very comfortable.  Objective: Vitals:   03/05/20 2055 03/06/20 0012 03/06/20 0429 03/06/20 0742  BP: (!) 131/57 131/61 (!) 146/65 (!) 150/65  Pulse: 79 72 70 80  Resp: 18 17 17 20   Temp: 98 F (36.7 C) 98 F (36.7 C) 98.3 F (36.8 C) 98.7 F (37.1 C)  TempSrc: Oral Oral Oral Oral  SpO2: 98% 97% 97% 98%  Weight:      Height:        Intake/Output Summary (Last 24 hours) at 03/06/2020 0808 Last data filed at 03/06/2020 0430 Gross per 24 hour  Intake 3411.78 ml  Output 1600 ml  Net 1811.78 ml   Filed Weights   03/04/20 0229  Weight: 59 kg    Examination:  General exam: Appears calm and comfortable  Respiratory system: Clear to auscultation. Respiratory effort normal. Cardiovascular system: S1 & S2 heard, RRR. No JVD, murmurs, rubs, gallops or clicks. No pedal edema. Gastrointestinal system: Abdomen is nondistended, soft and nontender. No organomegaly or masses felt. Normal bowel sounds heard. Central nervous system: Alert and oriented. No focal neurological deficits. Skin: No rashes, lesions or ulcers.  Psychiatry: Judgement and insight appear normal. Mood & affect appropriate.   Data Reviewed: I have personally reviewed following labs and imaging studies  CBC: Recent Labs  Lab  03/04/20 0459 03/05/20 0743 03/06/20 0205  WBC 22.3* 14.9* 12.6*  NEUTROABS 19.7* 11.1*  --   HGB 14.0 13.0 11.8*  HCT 42.0 40.2 36.2  MCV 89.4 91.4 91.9  PLT 1,054* 765* 250*   Basic Metabolic Panel: Recent Labs  Lab 03/04/20 0459 03/05/20 0743 03/06/20 0205  NA 136 137 139  K 4.0 3.9 3.8  CL 100 102 103  CO2 25 25 30   GLUCOSE 161* 118* 131*  BUN 26* 13 10  CREATININE 0.67 0.72 0.65  CALCIUM 9.0 8.6* 8.8*   GFR: Estimated Creatinine Clearance: 54.1 mL/min (by C-G formula based on SCr of 0.65 mg/dL). Liver Function Tests: Recent Labs  Lab 03/04/20 0459  AST 28  ALT 24  ALKPHOS 53  BILITOT 0.7  PROT 7.5  ALBUMIN 4.4   No results for input(s): LIPASE, AMYLASE in the last 168 hours. No results for input(s): AMMONIA in the last 168 hours. Coagulation Profile: Recent Labs  Lab 03/04/20 0459 03/05/20 0743 03/06/20 0205  INR 2.6* 1.3* 1.3*   Cardiac Enzymes: No results for input(s): CKTOTAL, CKMB, CKMBINDEX,  TROPONINI in the last 168 hours. BNP (last 3 results) No results for input(s): PROBNP in the last 8760 hours. HbA1C: Recent Labs    03/05/20 1400  HGBA1C 5.6   CBG: No results for input(s): GLUCAP in the last 168 hours. Lipid Profile: No results for input(s): CHOL, HDL, LDLCALC, TRIG, CHOLHDL, LDLDIRECT in the last 72 hours. Thyroid Function Tests: No results for input(s): TSH, T4TOTAL, FREET4, T3FREE, THYROIDAB in the last 72 hours. Anemia Panel: No results for input(s): VITAMINB12, FOLATE, FERRITIN, TIBC, IRON, RETICCTPCT in the last 72 hours. Sepsis Labs: No results for input(s): PROCALCITON, LATICACIDVEN in the last 168 hours.  Recent Results (from the past 240 hour(s))  Respiratory Panel by RT PCR (Flu A&B, Covid) - Nasopharyngeal Swab     Status: None   Collection Time: 03/04/20  5:43 AM   Specimen: Nasopharyngeal Swab  Result Value Ref Range Status   SARS Coronavirus 2 by RT PCR NEGATIVE NEGATIVE Final    Comment: (NOTE) SARS-CoV-2  target nucleic acids are NOT DETECTED.  The SARS-CoV-2 RNA is generally detectable in upper respiratoy specimens during the acute phase of infection. The lowest concentration of SARS-CoV-2 viral copies this assay can detect is 131 copies/mL. A negative result does not preclude SARS-Cov-2 infection and should not be used as the sole basis for treatment or other patient management decisions. A negative result may occur with  improper specimen collection/handling, submission of specimen other than nasopharyngeal swab, presence of viral mutation(s) within the areas targeted by this assay, and inadequate number of viral copies (<131 copies/mL). A negative result must be combined with clinical observations, patient history, and epidemiological information. The expected result is Negative.  Fact Sheet for Patients:  PinkCheek.be  Fact Sheet for Healthcare Providers:  GravelBags.it  This test is no t yet approved or cleared by the [place] FDA and  has been authorized for detection and/or diagnosis of SARS-CoV-2 by FDA under an Emergency Use Authorization (EUA). This EUA will remain  in effect (meaning this test can be used) for the duration of the COVID-19 declaration under Section 564(b)(1) of the Act, 21 U.S.C. section 360bbb-3(b)(1), unless the authorization is terminated or revoked sooner.     Influenza A by PCR NEGATIVE NEGATIVE Final   Influenza B by PCR NEGATIVE NEGATIVE Final    Comment: (NOTE) The Xpert Xpress SARS-CoV-2/FLU/RSV assay is intended as an aid in  the diagnosis of influenza from Nasopharyngeal swab specimens and  should not be used as a sole basis for treatment. Nasal washings and  aspirates are unacceptable for Xpert Xpress SARS-CoV-2/FLU/RSV  testing.  Fact Sheet for Patients: PinkCheek.be  Fact Sheet for Healthcare  Providers: GravelBags.it  This test is not yet approved or cleared by the [place] FDA and  has been authorized for detection and/or diagnosis of SARS-CoV-2 by  FDA under an Emergency Use Authorization (EUA). This EUA will remain  in effect (meaning this test can be used) for the duration of the  Covid-19 declaration under Section 564(b)(1) of the Act, 21  U.S.C. section 360bbb-3(b)(1), unless the authorization is  terminated or revoked. Performed at [facility], [address], [place], [place] [address]   Surgical pcr screen     Status: None   Collection Time: 03/04/20 11:48 PM   Specimen: Nasal Mucosa; Nasal Swab  Result Value Ref Range Status   MRSA, PCR NEGATIVE NEGATIVE Final   Staphylococcus aureus NEGATIVE NEGATIVE Final    Comment: (NOTE) The Xpert SA Assay (FDA approved for  NASAL specimens in patients 40 years of age and older), is one component of a comprehensive surveillance program. It is not intended to diagnose infection nor to guide or monitor treatment. Performed at [facility], [place] [address] [place], [place] [address]       Radiology Studies: DG Pelvis 1-2 Views  Result Date: 03/05/2020 CLINICAL DATA:  ORIF left femur fracture EXAM: PELVIS - 1-2 VIEW COMPARISON:  Same day FINDINGS: Bones of the pelvis appear normal. Gamma nail placement on the left for treatment of an intertrochanteric fracture of the femur. Components appear well positioned. No radiographically detectable complication. IMPRESSION: Good appearance following ORIF for intertrochanteric fracture of the left femur. Electronically Signed   By: Nelson Chimes M.D.   On: 03/05/2020 15:36   CT Head Wo Contrast  Result Date: 03/04/2020 CLINICAL DATA:  Reported recent CVA.  Fall. EXAM: CT HEAD WITHOUT CONTRAST TECHNIQUE: Contiguous axial images were obtained from the base of the skull through the vertex without intravenous contrast. COMPARISON:  Brain MRI February 16, 2017 FINDINGS: Brain: There is a degree of cerebellar atrophy. Ventricles and sulci elsewhere are commensurate with age. There is no intracranial mass, hemorrhage, extra-axial fluid collection, or midline shift. There is evidence of prior infarct in the right posterior inferior cerebellar region. There is decreased attenuation in a portion of the right dentate nucleus which potentially could represent recent infarct. There is evidence of a prior infarct in the anterior left centrum semiovale. There is patchy small vessel disease in the centra semiovale as well. Vascular: No appreciable hyperdense vessel. There is calcification in each carotid siphon region. Skull: The bony calvarium appears intact. Sinuses/Orbits: There is mucosal thickening in several ethmoid air cells. There is a prosthetic globe on the left. There is a cystic appearing area posterior to the prosthesis in the left lobe region measuring 1.8 x 1.8 cm. The right orbit appears normal. Other: Mastoid air cells are clear. IMPRESSION: 1. Age uncertain but potentially recent infarct in the right cerebellar dentate nucleus. Nearby older infarct in the posteroinferior cerebellar region, present on prior MR examination. 2. Prior infarct in the anterior left centrum semiovale. There is mild patchy small vessel disease in the centra semiovale bilaterally. 3.  No mass or hemorrhage. 4.  There are foci of arterial vascular calcification. 5. 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 Cystic appearing area is located in the left orbit immediately posterior to the prosthesis on the left of uncertain etiology. 6.  Mucosal thickening in several ethmoid air cells noted. Electronically Signed   By: Lowella Grip III M.D.   On: 03/04/2020 08:35   DG C-Arm 1-60 Min  Result Date: 03/05/2020 CLINICAL DATA:  Left femoral fracture, ORIF EXAM: DG C-ARM 1-60 MIN; LEFT FEMUR 2 VIEWS CONTRAST:  None FLUOROSCOPY TIME:  Fluoroscopy Time:  1 minutes 39 seconds Radiation Exposure  Index (if provided by the fluoroscopic device): 13.51 mGy Number of Acquired Spot Images: 8 COMPARISON:  03/04/2020 FINDINGS: Eight fluoroscopic intraoperative radiographs demonstrate open reduction internal fixation of a intratrochanteric left femoral neck fracture with a a long intramedullary rod and axial screw. No unexpected fracture or dislocation. Normal alignment on this limited examination. IMPRESSION: Left femoral neck ORIF.  No unexpected fracture or dislocation. Electronically Signed   By: Fidela Salisbury MD   On: 03/05/2020 12:51   DG FEMUR MIN 2 VIEWS LEFT  Result Date: 03/05/2020 CLINICAL DATA:  ORIF femur fracture EXAM: LEFT FEMUR 2 VIEWS COMPARISON:  Earlier same day FINDINGS: Gamma  nail fixation of an intertrochanteric fracture. Alignment appears anatomic. Locking screw in the proximal to mid femoral diaphyseal region. IMPRESSION: Good appearance following ORIF for intertrochanteric fracture. Electronically Signed   By: Nelson Chimes M.D.   On: 03/05/2020 15:35   DG FEMUR MIN 2 VIEWS LEFT  Result Date: 03/05/2020 CLINICAL DATA:  Left femoral fracture, ORIF EXAM: DG C-ARM 1-60 MIN; LEFT FEMUR 2 VIEWS CONTRAST:  None FLUOROSCOPY TIME:  Fluoroscopy Time:  1 minutes 39 seconds Radiation Exposure Index (if provided by the fluoroscopic device): 13.51 mGy Number of Acquired Spot Images: 8 COMPARISON:  03/04/2020 FINDINGS: Eight fluoroscopic intraoperative radiographs demonstrate open reduction internal fixation of a intratrochanteric left femoral neck fracture with a a long intramedullary rod and axial screw. No unexpected fracture or dislocation. Normal alignment on this limited examination. IMPRESSION: Left femoral neck ORIF.  No unexpected fracture or dislocation. Electronically Signed   By: Fidela Salisbury MD   On: 03/05/2020 12:51    Scheduled Meds: . acetaminophen  500 mg Oral Q8H  . amLODipine  10 mg Oral QPM  . Chlorhexidine Gluconate Cloth  6 each Topical Daily  . docusate sodium   100 mg Oral BID  . enoxaparin (LOVENOX) injection  30 mg Subcutaneous Q12H  . magnesium oxide  400 mg Oral Daily  . melatonin  3 mg Oral QHS  . oyster calcium  500 mg of elemental calcium Oral BID   Continuous Infusions: . sodium chloride 75 mL/hr at 03/05/20 1859  . lactated ringers 10 mL/hr at 03/05/20 1008     LOS: 2 days   Time spent: 30 minutes   Darliss Cheney, MD [facility]  03/06/2020, 8:08 AM   To contact the attending provider between 7A-7P or the covering provider during after hours 7P-7A, please log into the web site [email or web address].
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
## 2020-03-07 DIAGNOSIS — S72142A Displaced intertrochanteric fracture of left femur, initial encounter for closed fracture: Secondary | ICD-10-CM | POA: Diagnosis not present

## 2020-03-07 LAB — CBC
HCT: 34.8 % — ABNORMAL LOW (ref 36.0–46.0)
Hemoglobin: 11.3 g/dL — ABNORMAL LOW (ref 12.0–15.0)
MCH: 29.8 pg (ref 26.0–34.0)
MCHC: 32.5 g/dL (ref 30.0–36.0)
MCV: 91.8 fL (ref 80.0–100.0)
Platelets: 678 10*3/uL — ABNORMAL HIGH (ref 150–400)
RBC: 3.79 MIL/uL — ABNORMAL LOW (ref 3.87–5.11)
RDW: 14.3 % (ref 11.5–15.5)
WBC: 11.4 10*3/uL — ABNORMAL HIGH (ref 4.0–10.5)
nRBC: 0 % (ref 0.0–0.2)

## 2020-03-07 LAB — BASIC METABOLIC PANEL
Anion gap: 7 (ref 5–15)
BUN: 8 mg/dL (ref 8–23)
CO2: 30 mmol/L (ref 22–32)
Calcium: 8.4 mg/dL — ABNORMAL LOW (ref 8.9–10.3)
Chloride: 103 mmol/L (ref 98–111)
Creatinine, Ser: 0.7 mg/dL (ref 0.44–1.00)
GFR, Estimated: >60 mL/min (ref >60)
Glucose, Bld: 117 mg/dL — ABNORMAL HIGH (ref 70–99)
Potassium: 3.4 mmol/L — ABNORMAL LOW (ref 3.5–5.1)
Sodium: 140 mmol/L (ref 135–145)

## 2020-03-07 LAB — PROTIME-INR
INR: 1.3 — ABNORMAL HIGH (ref 0.8–1.2)
Prothrombin Time: 15.9 seconds — ABNORMAL HIGH (ref 11.4–15.2)

## 2020-03-07 LAB — GLUCOSE, CAPILLARY: Glucose-Capillary: 127 mg/dL — ABNORMAL HIGH (ref 70–99)

## 2020-03-07 MED ORDER — WARFARIN SODIUM 5 MG PO TABS
10.0000 mg | ORAL_TABLET | Freq: Once | ORAL | Status: AC
  Administered 2020-03-07: 10 mg via ORAL
  Filled 2020-03-07: qty 2

## 2020-03-07 MED ORDER — KETOROLAC TROMETHAMINE 15 MG/ML IJ SOLN
15.0000 mg | Freq: Four times a day (QID) | INTRAMUSCULAR | Status: DC | PRN
  Administered 2020-03-08 (×2): 15 mg via INTRAVENOUS
  Filled 2020-03-07 (×2): qty 1

## 2020-03-07 MED ORDER — POTASSIUM CHLORIDE CRYS ER 20 MEQ PO TBCR
40.0000 meq | EXTENDED_RELEASE_TABLET | Freq: Once | ORAL | Status: AC
  Administered 2020-03-07: 40 meq via ORAL
  Filled 2020-03-07: qty 2

## 2020-03-07 NOTE — Progress Notes (Signed)
PROGRESS NOTE    Ana [name]  JGG:[id number] DOB: 1945-11-06 DOA: 03/04/2020 PCP: Celene Squibb, MD     Brief Narrative:  Ana [name] a 74 y.o.femalewith medical history significant forhypertension, prosthetic left eye, aortic valve disease (Saint Jude AVR 1998), chronic Lyme disease, CVA with left-sided weakness who presents to the emergency department due to a fall sustained at home yesterday (10/20). Patient states that she had a stroke about 3 weeks ago. 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 Patient underwent IM nailing of the left hip 10/22 by Dr. Marcelino Scot.  New events last 24 hours / Subjective: Was difficult to arouse this morning by RN, patient received Narcan with improvement in mental status.  On my examination, patient states that she is "confused." When asked to elaborate, patient states that she just needs to rest.  She does not have any other physical complaints.  Assessment & Plan:   Principal Problem:   Closed comminuted intertrochanteric fracture of proximal femur, left, initial encounter [facility]) Active Problems:   Essential hypertension   Thrombocytosis   CVA (cerebral vascular accident) ([place])   Leukocytosis   Fall at home, initial encounter   Hyperglycemia   Closed left femoral fracture ([place])   Close comminuted intertrochanteric fracture of left proximal femur secondary to accidental fall -S/p IMN 03/05/2020 -SNF placement pending -Judicious use of narcotics due to event this  morning  Acute blood loss anemia -Continue to monitor CBC, remains stable  History of CVA with left-sided weakness -Patient had a small recent (late acute to subacute) infarcts and right middle cerebral peduncle at the cortex of the posterior left frontal/parietal region -Continue Coumadin  Essential hypertension -Continue Norvasc  History of Saint Jude AVR -Continue Coumadin, Lovenox bridge  Hypokalemia -Replace, trend   DVT prophylaxis:  enoxaparin (LOVENOX) injection 30 mg Start: 03/06/20 1000 SCDs Start: 03/05/20 1426 SCDs Start: 03/04/20 0925 warfarin (COUMADIN) tablet 10 mg  Code Status: Full code Family Communication: No family at bedside Disposition Plan:  Status is: Inpatient  Remains inpatient appropriate because:Unsafe d/c plan   Dispo: The patient is from: Home              Anticipated d/c is to: SNF              Anticipated d/c date is: 1 day              Patient currently is medically stable to d/c.  SNF placement pending.   Consultants:   Orthopedic surgery   Antimicrobials:  Anti-infectives (From admission, onward)   Start     Dose/Rate Route Frequency Ordered Stop   03/05/20 0600  ceFAZolin (ANCEF) IVPB 2g/100 mL premix        2 g 200 mL/hr over 30 Minutes Intravenous To Short Stay 03/04/20 2242 03/05/20 1100        Objective: Vitals:   03/06/20 1939 03/07/20 0513 03/07/20 0607 03/07/20 0823  BP: (!) 141/66 (!) 182/84 (!) 146/63 121/60  Pulse: 100 (!) 110 97 73  Resp: 18  16 15 16   Temp: 99.8 F (37.7 C) 98.9 F (37.2 C) 99.4 F (37.4 C) 97.6 F (36.4 C)  TempSrc: Oral Oral Oral Oral  SpO2: 91% 90% 99% 97%  Weight:      Height:        Intake/Output Summary (Last 24 hours) at 03/07/2020 1231 Last data filed at 03/06/2020 1800 Gross per 24 hour  Intake 0 ml  Output --  Net 0 ml   Filed Weights   03/04/20 0229  Weight: 59 kg    Examination:  General exam: Appears calm and comfortable  Respiratory system: Clear to  auscultation. Respiratory effort normal. No respiratory distress. No conversational dyspnea.  Cardiovascular system: S1 & S2 heard, RRR. No pedal edema. Gastrointestinal system: Abdomen is nondistended, soft and nontender. Normal bowel sounds heard. Central nervous system: Alert Extremities: Symmetric in appearance  Skin: No rashes, lesions or ulcers on exposed skin   Data Reviewed: I have personally reviewed following labs and imaging studies  CBC: Recent Labs  Lab 03/04/20 0459 03/05/20 0743 03/06/20 0205 03/07/20 0245  WBC 22.3* 14.9* 12.6* 11.4*  NEUTROABS 19.7* 11.1*  --   --   HGB 14.0 13.0 11.8* 11.3*  HCT 42.0 40.2 36.2 34.8*  MCV 89.4 91.4 91.9 91.8  PLT 1,054* 765* 720* 782*   Basic Metabolic Panel: Recent Labs  Lab 03/04/20 0459 03/05/20 0743 03/06/20 0205 03/07/20 0245  NA 136 137 139 140  K 4.0 3.9 3.8 3.4*  CL 100 102 103 103  CO2 25 25 30 30   GLUCOSE 161* 118* 131* 117*  BUN 26* 13 10 8   CREATININE 0.67 0.72 0.65 0.70  CALCIUM 9.0 8.6* 8.8* 8.4*   GFR: Estimated Creatinine Clearance: 54.1 mL/min (by C-G formula based on SCr of 0.7 mg/dL). Liver Function Tests: Recent Labs  Lab 03/04/20 0459  AST 28  ALT 24  ALKPHOS 53  BILITOT 0.7  PROT 7.5  ALBUMIN 4.4   No results for input(s): LIPASE, AMYLASE in the last 168 hours. No results for input(s): AMMONIA in the last 168 hours. Coagulation Profile: Recent Labs  Lab 03/04/20 0459 03/05/20 0743 03/06/20 0205 03/07/20 0245  INR 2.6* 1.3* 1.3* 1.3*   Cardiac Enzymes: No results for input(s): CKTOTAL, CKMB, CKMBINDEX, TROPONINI in the last 168 hours. BNP (last 3 results) No results for input(s): PROBNP in the last 8760 hours. HbA1C: Recent Labs    03/05/20 1400  HGBA1C 5.6   CBG: Recent Labs  Lab 03/07/20 0551  GLUCAP 127*   Lipid Profile: No results for input(s): CHOL, HDL, LDLCALC, TRIG, CHOLHDL, LDLDIRECT in the last 72 hours. Thyroid Function Tests: No results for input(s):  TSH, T4TOTAL, FREET4, T3FREE, THYROIDAB in the last 72 hours. Anemia Panel: No results for input(s): VITAMINB12, FOLATE, FERRITIN, TIBC, IRON, RETICCTPCT in the last 72 hours. Sepsis Labs: No results for input(s): PROCALCITON, LATICACIDVEN in the last 168 hours.  Recent Results (from the past 240 hour(s))  Respiratory Panel by RT PCR (Flu A&B, Covid) - Nasopharyngeal Swab     Status: None   Collection Time: 03/04/20  5:43 AM   Specimen: Nasopharyngeal Swab  Result Value Ref Range Status   SARS Coronavirus 2 by RT PCR NEGATIVE NEGATIVE Final    Comment: (NOTE) SARS-CoV-2 target nucleic acids are NOT DETECTED.  The SARS-CoV-2 RNA is generally detectable in upper respiratoy specimens during the acute phase of infection. The lowest concentration of SARS-CoV-2 viral copies this assay can detect is 131 copies/mL. A negative result does  not preclude SARS-Cov-2 infection and should not be used as the sole basis for treatment or other patient management decisions. A negative result may occur with  improper specimen collection/handling, submission of specimen other than nasopharyngeal swab, presence of viral mutation(s) within the areas targeted by this assay, and inadequate number of viral copies (<131 copies/mL). A negative result must be combined with clinical observations, patient history, and epidemiological information. The expected result is Negative.  Fact Sheet for Patients:  PinkCheek.be  Fact Sheet for Healthcare Providers:  GravelBags.it  This test is no t yet approved or cleared by the [place] FDA and  has been authorized for detection and/or diagnosis of SARS-CoV-2 by FDA under an Emergency Use Authorization (EUA). This EUA will remain  in effect (meaning this test can be used) for the duration of the COVID-19 declaration under Section 564(b)(1) of the Act, 21 U.S.C. section 360bbb-3(b)(1), unless the  authorization is terminated or revoked sooner.     Influenza A by PCR NEGATIVE NEGATIVE Final   Influenza B by PCR NEGATIVE NEGATIVE Final    Comment: (NOTE) The Xpert Xpress SARS-CoV-2/FLU/RSV assay is intended as an aid in  the diagnosis of influenza from Nasopharyngeal swab specimens and  should not be used as a sole basis for treatment. Nasal washings and  aspirates are unacceptable for Xpert Xpress SARS-CoV-2/FLU/RSV  testing.  Fact Sheet for Patients: PinkCheek.be  Fact Sheet for Healthcare Providers: GravelBags.it  This test is not yet approved or cleared by the [place] FDA and  has been authorized for detection and/or diagnosis of SARS-CoV-2 by  FDA under an Emergency Use Authorization (EUA). This EUA will remain  in effect (meaning this test can be used) for the duration of the  Covid-19 declaration under Section 564(b)(1) of the Act, 21  U.S.C. section 360bbb-3(b)(1), unless the authorization is  terminated or revoked. Performed at [facility], [address], [place], [place] [address]   Surgical pcr screen     Status: None   Collection Time: 03/04/20 11:48 PM   Specimen: Nasal Mucosa; Nasal Swab  Result Value Ref Range Status   MRSA, PCR NEGATIVE NEGATIVE Final   Staphylococcus aureus NEGATIVE NEGATIVE Final    Comment: (NOTE) The Xpert SA Assay (FDA approved for NASAL specimens in patients 70 years of age and older), is one component of a comprehensive surveillance program. It is not intended to diagnose infection nor to guide or monitor treatment. Performed at [facility], [place] [address] [place], [place] [address]       Radiology Studies: DG Pelvis 1-2 Views  Result Date: 03/05/2020 CLINICAL DATA:  ORIF left femur fracture EXAM: PELVIS - 1-2 VIEW COMPARISON:  Same day FINDINGS: Bones of the pelvis appear normal. Gamma nail placement on the left for treatment of an intertrochanteric  fracture of the femur. Components appear well positioned. No radiographically detectable complication. IMPRESSION: Good appearance following ORIF for intertrochanteric fracture of the left femur. Electronically Signed   By: Nelson Chimes M.D.   On: 03/05/2020 15:36   DG FEMUR MIN 2 VIEWS LEFT  Result Date: 03/05/2020 CLINICAL DATA:  ORIF femur fracture EXAM: LEFT FEMUR 2 VIEWS COMPARISON:  Earlier same day FINDINGS: Gamma nail fixation of an intertrochanteric fracture. Alignment appears anatomic. Locking screw in the proximal to mid femoral diaphyseal region. IMPRESSION: Good appearance following ORIF for intertrochanteric fracture. Electronically Signed   By: Nelson Chimes M.D.   On: 03/05/2020 15:35      Scheduled Meds: .  acetaminophen  500 mg Oral Q8H  . amLODipine  10 mg Oral QPM  . Chlorhexidine Gluconate Cloth  6 each Topical Daily  . docusate sodium  100 mg Oral BID  . enoxaparin (LOVENOX) injection  30 mg Subcutaneous Q12H  . magnesium oxide  400 mg Oral Daily  . melatonin  3 mg Oral QHS  . oyster calcium  500 mg of elemental calcium Oral BID  . warfarin  10 mg Oral ONCE-1600  . Warfarin - Pharmacist Dosing Inpatient   Does not apply q1600   Continuous Infusions: . lactated ringers 10 mL/hr at 03/05/20 1008     LOS: 3 days      Time spent: 35 minutes   Dessa Phi, DO [facility] 03/07/2020, 12:31 PM   Available via Epic secure chat 7am-7pm After these hours, please refer to coverage provider listed on [email or web address]
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
## 2020-03-07 NOTE — TOC CAGE-AID Note (Signed)
Transition of Care [facility]) - CAGE-AID Screening   Patient Details  Name: Ana [name] MRN: [id number] Date of Birth: 05-13-1946  Transition of Care [facility]) CM/SW Contact:    Joanne Chars, LCSW Phone Number: 03/07/2020, 11:54 AM   Clinical Narrative:CSW met with pt to compelte cage-aid screening.  Pt denies any use of alcohol or illegal drugs or any history of such use.     CAGE-AID Screening:

## 2020-03-07 NOTE — Progress Notes (Signed)
Patient received 1 tablet of hydrocodone 5-325mg  around 2100 last night. RN went to check patient and patient was difficult to arouse this morning. Narcan 0.4mg  IV was given and was effective. Patient awake and alert after 10 minutes and requested to use the bedpan. Will continue to monitor patient.

## 2020-03-07 NOTE — Plan of Care (Signed)

## 2020-03-07 NOTE — TOC Initial Note (Addendum)
Transition of Care [facility]) - Initial/Assessment Note    Patient Details  Name: Ana [name] MRN: [id number] Date of Birth: [birth year]  Transition of Care [facility]) CM/SW Contact:    Joanne Chars, LCSW Phone Number: 03/07/2020, 10:27 AM  Clinical Narrative:    CSW spoke with pt regarding discharge recommendation for SNF.  Pt agreeable to this plan, lives in [place], has not been to SNF previously.  Choice document given.  Permission given to speak to son [name] and to send info out in hub.  Pt is not vaccinated for covid.  PCP in place.  CSW attempted to speak to son [name], no answer, LM.   1150: TC from son, [name].  He is in agreement with plan for SNF.  Discussed choice in bed offers.              Expected Discharge Plan: Skilled Nursing Facility Barriers to Discharge: Continued Medical Work up, SNF Pending bed offer   Patient Goals and CMS Choice Patient states their goals for this hospitalization and ongoing recovery are:: "get back to as normal as I can" CMS Medicare.gov Compare Post Acute Care list provided to:: Patient Choice offered to / list presented to : Patient  Expected Discharge Plan and Services Expected Discharge Plan: [place] Choice: [place] arrangements for the past 2 months: Single Family Home                                      Prior Living Arrangements/Services Living arrangements for the past 2 months: Single Family Home Lives with:: Adult Children Patient language and need for interpreter reviewed:: Yes Do you feel safe going back to the place where you live?: Yes      Need for Family Participation in Patient Care: Yes (Comment) Care giver support system in place?: Yes (comment) Current home services: Other (comment) (none) Criminal Activity/Legal Involvement Pertinent to Current Situation/Hospitalization: No - Comment as needed  Activities of Daily Living Home Assistive  Devices/Equipment: Walker (specify type) ADL Screening (condition at time of admission) Patient's cognitive ability adequate to safely complete daily activities?: Yes Is the patient deaf or have difficulty hearing?: No Does the patient have difficulty seeing, even when wearing glasses/contacts?: Yes (blind in left eye) Does the patient have difficulty concentrating, remembering, or making decisions?: Yes (since her stroke 4 weeks ago) Patient able to express need for assistance with ADLs?: Yes Does the patient have difficulty dressing or bathing?: Yes Independently performs ADLs?: Yes (appropriate for developmental age) Does the patient have difficulty walking or climbing stairs?: Yes (weakness due to lyme disease) Weakness of Legs: Both Weakness of Arms/Hands: Both  Permission Sought/Granted Permission sought to share information with : Facility Sport and exercise psychologist, Family Supports Permission granted to share information with : Yes, Verbal Permission Granted  Share Information with NAME: son [name]  Permission granted to share info w AGENCY: SNF        Emotional Assessment Appearance:: Appears stated age Attitude/Demeanor/Rapport: Lethargic Affect (typically observed): Appropriate Orientation: : Oriented to Self, Oriented to Place, Oriented to  Time, Oriented to Situation Alcohol / Substance Use: Not Applicable Psych Involvement: No (comment)  Admission diagnosis:  Thrombocytosis [D75.839] Closed left femoral fracture (HCC) [S72.92XA] Closed nondisplaced intertrochanteric fracture of left femur, initial encounter ([place]) [S72.145A] Closed left subtrochanteric femur fracture, initial encounter [facility]) [S72.22XA] Patient Active Problem List  Diagnosis Date Noted  . Closed comminuted intertrochanteric fracture of proximal femur, left, initial encounter ([place]) 03/04/2020  . CVA (cerebral vascular accident) ([place]) 03/04/2020  . Leukocytosis 03/04/2020  . Fall at home, initial encounter  03/04/2020  . Hyperglycemia 03/04/2020  . Closed left femoral fracture ([place]) 03/04/2020  . Elevated vitamin B12 level 04/16/2017  . Cholelithiasis 01/29/2017  . Acute cholecystitis 01/29/2017  . Polycythemia 01/29/2017  . Thrombocytosis 01/29/2017  . Chronic anticoagulation 11/30/2016  . Encounter for screening colonoscopy 11/30/2016  . Iron deficiency 11/10/2016  . Displaced comminuted fracture of right patella 07/16/2014  . Patella fracture 07/16/2014  . CHEST PAIN-PRECORDIAL 09/24/2009  . Hyperlipidemia 11/16/2008  . DEPRESSION 11/16/2008  . Essential hypertension 11/16/2008  . AORTIC INSUFFICIENCY 11/16/2008  . IBS 11/16/2008  . ARTHRITIS 11/16/2008  . Seizure disorder ([place]) 11/16/2008  . CHRONIC FATIGUE SYNDROME 11/16/2008  . THYROID DISEASE, HX OF 11/16/2008   PCP:  Celene Squibb, MD Pharmacy:   [facility], [place] AT [place] [address] [place] [address] Phone: [phone] Fax: [phone]     Social Determinants of Health (SDOH) Interventions    Readmission Risk Interventions No flowsheet data found.

## 2020-03-07 NOTE — Plan of Care (Signed)
  Problem: Education: Goal: Verbalization of understanding the information provided (i.e., activity precautions, restrictions, etc) will improve Outcome: Progressing Goal: Individualized Educational Video(s) Outcome: Progressing   Problem: Clinical Measurements: Goal: Postoperative complications will be avoided or minimized Outcome: Progressing   Problem: Self-Concept: Goal: Ability to maintain and perform role responsibilities to the fullest extent possible will improve Outcome: Progressing   Problem: Pain Management: Goal: Pain level will decrease Outcome: Progressing   Problem: Education: Goal: Knowledge of General Education information will improve Description: Including pain rating scale, medication(s)/side effects and non-pharmacologic comfort measures Outcome: Progressing   Problem: Health Behavior/Discharge Planning: Goal: Ability to manage health-related needs will improve Outcome: Progressing   Problem: Clinical Measurements: Goal: Ability to maintain clinical measurements within normal limits will improve Outcome: Progressing Goal: Will remain free from infection Outcome: Progressing Goal: Diagnostic test results will improve Outcome: Progressing Goal: Respiratory complications will improve Outcome: Progressing Goal: Cardiovascular complication will be avoided Outcome: Progressing   Problem: Activity: Goal: Risk for activity intolerance will decrease Outcome: Progressing   Problem: Nutrition: Goal: Adequate nutrition will be maintained Outcome: Progressing   Problem: Coping: Goal: Level of anxiety will decrease Outcome: Progressing   Problem: Elimination: Goal: Will not experience complications related to bowel motility Outcome: Progressing Goal: Will not experience complications related to urinary retention Outcome: Progressing   Problem: Pain Managment: Goal: General experience of comfort will improve Outcome: Progressing   Problem:  Safety: Goal: Ability to remain free from injury will improve Outcome: Progressing   Problem: Skin Integrity: Goal: Risk for impaired skin integrity will decrease Outcome: Progressing Patient denied pain. Tolerated PO meds. Somnolent but easy to awake. All items within reach.

## 2020-03-07 NOTE — NC FL2 (Signed)
[place] LEVEL OF CARE SCREENING TOOL     IDENTIFICATION  Patient Name: Ana [name] Birthdate: [birth year] Sex: female Admission Date (Current Location): 03/04/2020  [facility] and [place] Number:  Herbalist and Address:  [facility], [place] [address] [place], [place] [address]      Provider Number: [id number]  Attending Physician Name and Address:  Dessa Phi, DO  Relative Name and Phone Number:  Markella, Dao [phone]    Current Level of Care: Hospital Recommended Level of Care: [place] Prior Approval Number:    Date Approved/Denied:   PASRR Number: [id number]  Discharge Plan: SNF    Current Diagnoses: Patient Active Problem List   Diagnosis Date Noted  . Closed comminuted intertrochanteric fracture of proximal femur, left, initial encounter ([place]) 03/04/2020  . CVA (cerebral vascular accident) ([place]) 03/04/2020  . Leukocytosis 03/04/2020  . Fall at home, initial encounter 03/04/2020  . Hyperglycemia 03/04/2020  . Closed left femoral fracture ([place]) 03/04/2020  . Elevated vitamin B12 level 04/16/2017  . Cholelithiasis 01/29/2017  . Acute cholecystitis 01/29/2017  . Polycythemia 01/29/2017  . Thrombocytosis 01/29/2017  . Chronic anticoagulation 11/30/2016  . Encounter for screening colonoscopy 11/30/2016  . Iron deficiency 11/10/2016  . Displaced comminuted fracture of right patella 07/16/2014  . Patella fracture 07/16/2014  . CHEST PAIN-PRECORDIAL 09/24/2009  . Hyperlipidemia 11/16/2008  . DEPRESSION 11/16/2008  . Essential hypertension 11/16/2008  . AORTIC INSUFFICIENCY 11/16/2008  . IBS 11/16/2008  . ARTHRITIS 11/16/2008  . Seizure disorder ([place]) 11/16/2008  . CHRONIC FATIGUE SYNDROME 11/16/2008  . THYROID DISEASE, HX OF 11/16/2008    Orientation RESPIRATION BLADDER Height & Weight     Self, Time, Situation, Place  O2 Continent Weight: 130 lb (59 kg) Height:  5\' 4"  (162.6 cm)   BEHAVIORAL SYMPTOMS/MOOD NEUROLOGICAL BOWEL NUTRITION STATUS    Convulsions/Seizures Continent Diet (regular diet.  See discharge summary.)  AMBULATORY STATUS COMMUNICATION OF NEEDS Skin   Limited Assist Verbally Surgical wounds                       Personal Care Assistance Level of Assistance  Bathing, Feeding, Dressing Bathing Assistance: Maximum assistance Feeding assistance: Limited assistance Dressing Assistance: Maximum assistance     Functional Limitations Info  Sight, Hearing, Speech Sight Info: Adequate Hearing Info: Adequate Speech Info: Adequate    SPECIAL CARE FACTORS FREQUENCY  PT (By licensed PT), OT (By licensed OT)     PT Frequency: 5x week OT Frequency: 5x week            Contractures Contractures Info: Not present    Additional Factors Info  Code Status, Allergies Code Status Info: full Allergies Info: Blood-group Specific Substance           Current Medications (03/07/2020):  This is the current hospital active medication list Current Facility-Administered Medications  Medication Dose Route Frequency Provider Last Rate Last Admin  . acetaminophen (TYLENOL) tablet 325-650 mg  325-650 mg Oral Q6H PRN Ainsley Spinner, PA-C      . acetaminophen (TYLENOL) tablet 500 mg  500 mg Oral Q8H Ainsley Spinner, PA-C   500 mg at 03/06/20 0515  . alum & mag hydroxide-simeth (MAALOX/MYLANTA) 200-200-20 MG/5ML suspension 30 mL  30 mL Oral Q4H PRN Ainsley Spinner, PA-C      . amLODipine (NORVASC) tablet 10 mg  10 mg Oral QPM Ainsley Spinner, PA-C   10 mg at 03/06/20 1749  . Chlorhexidine Gluconate  Cloth 2 % PADS 6 each  6 each Topical Daily Darliss Cheney, MD   6 each at 03/07/20 1016  . docusate sodium (COLACE) capsule 100 mg  100 mg Oral BID Ainsley Spinner, PA-C   100 mg at 03/07/20 1016  . enoxaparin (LOVENOX) injection 30 mg  30 mg Subcutaneous Q12H Darliss Cheney, MD   30 mg at 03/07/20 1017  . ketorolac (TORADOL) 15 MG/ML injection 15 mg  15 mg Intravenous Q6H PRN Dessa Phi, DO      . lactated ringers infusion   Intravenous Continuous Ainsley Spinner, PA-C 10 mL/hr at 03/05/20 1008 New Bag at 03/05/20 1137  . magnesium hydroxide (MILK OF MAGNESIA) suspension 30 mL  30 mL Oral Daily PRN Ainsley Spinner, PA-C      . magnesium oxide (MAG-OX) tablet 400 mg  400 mg Oral Daily Ainsley Spinner, PA-C   400 mg at 03/07/20 1016  . melatonin tablet 3 mg  3 mg Oral QHS Ainsley Spinner, PA-C   3 mg at 03/06/20 2107  . menthol-cetylpyridinium (CEPACOL) lozenge 3 mg  1 lozenge Oral PRN Ainsley Spinner, PA-C       Or  . phenol (CHLORASEPTIC) mouth spray 1 spray  1 spray Mouth/Throat PRN Ainsley Spinner, PA-C      . metoCLOPramide (REGLAN) tablet 5-10 mg  5-10 mg Oral Q8H PRN Ainsley Spinner, PA-C       Or  . metoCLOPramide (REGLAN) injection 5-10 mg  5-10 mg Intravenous Q8H PRN Ainsley Spinner, PA-C      . naloxone [facility]) injection 0.4 mg  0.4 mg Intravenous PRN Ainsley Spinner, PA-C   0.4 mg at 03/07/20 0534  . ondansetron (ZOFRAN) tablet 4 mg  4 mg Oral Q6H PRN Ainsley Spinner, PA-C       Or  . ondansetron [facility]) injection 4 mg  4 mg Intravenous Q6H PRN Ainsley Spinner, PA-C      . oyster calcium tablet 500 mg of elemental calcium  500 mg of elemental calcium Oral BID Ainsley Spinner, PA-C   500 mg of elemental calcium at 03/07/20 1016  . warfarin (COUMADIN) tablet 10 mg  10 mg Oral ONCE-1600 Steen[place]k, Yujing Z, RPH      . Warfarin - Pharmacist Dosing Inpatient   Does not apply q1600 Mirian Capuchin, [facility]         Discharge Medications: Please see discharge summary for a list of discharge medications.  Relevant Imaging Results:  Relevant Lab Results:   Additional Information SSN [id number]  Joanne Chars, LCSW

## 2020-03-08 ENCOUNTER — Encounter (HOSPITAL_COMMUNITY): Payer: Self-pay | Admitting: Orthopedic Surgery

## 2020-03-08 DIAGNOSIS — S72142A Displaced intertrochanteric fracture of left femur, initial encounter for closed fracture: Secondary | ICD-10-CM | POA: Diagnosis not present

## 2020-03-08 LAB — BASIC METABOLIC PANEL
Anion gap: 9 (ref 5–15)
BUN: 19 mg/dL (ref 8–23)
CO2: 27 mmol/L (ref 22–32)
Calcium: 8.5 mg/dL — ABNORMAL LOW (ref 8.9–10.3)
Chloride: 103 mmol/L (ref 98–111)
Creatinine, Ser: 0.71 mg/dL (ref 0.44–1.00)
GFR, Estimated: >60 mL/min (ref >60)
Glucose, Bld: 107 mg/dL — ABNORMAL HIGH (ref 70–99)
Potassium: 4 mmol/L (ref 3.5–5.1)
Sodium: 139 mmol/L (ref 135–145)

## 2020-03-08 LAB — CBC
HCT: 35.5 % — ABNORMAL LOW (ref 36.0–46.0)
Hemoglobin: 11.6 g/dL — ABNORMAL LOW (ref 12.0–15.0)
MCH: 29.7 pg (ref 26.0–34.0)
MCHC: 32.7 g/dL (ref 30.0–36.0)
MCV: 90.8 fL (ref 80.0–100.0)
Platelets: 690 10*3/uL — ABNORMAL HIGH (ref 150–400)
RBC: 3.91 MIL/uL (ref 3.87–5.11)
RDW: 14.3 % (ref 11.5–15.5)
WBC: 12.7 10*3/uL — ABNORMAL HIGH (ref 4.0–10.5)
nRBC: 0 % (ref 0.0–0.2)

## 2020-03-08 LAB — SARS CORONAVIRUS 2 BY RT PCR (HOSPITAL ORDER, PERFORMED IN ~~LOC~~ HOSPITAL LAB): SARS Coronavirus 2: NEGATIVE

## 2020-03-08 LAB — MAGNESIUM: Magnesium: 1.8 mg/dL (ref 1.7–2.4)

## 2020-03-08 LAB — PROTIME-INR
INR: 1.3 — ABNORMAL HIGH (ref 0.8–1.2)
Prothrombin Time: 15.4 seconds — ABNORMAL HIGH (ref 11.4–15.2)

## 2020-03-08 MED ORDER — ENOXAPARIN SODIUM 30 MG/0.3ML ~~LOC~~ SOLN: 30.0000 mg | Freq: Two times a day (BID) | SUBCUTANEOUS | 0 refills | Status: DC

## 2020-03-08 MED ORDER — ACETAMINOPHEN 500 MG PO TABS: 500.0000 mg | ORAL_TABLET | Freq: Three times a day (TID) | ORAL | 0 refills | Status: DC | PRN

## 2020-03-08 MED ORDER — IBUPROFEN 800 MG PO TABS: 800.0000 mg | ORAL_TABLET | Freq: Three times a day (TID) | ORAL | 0 refills | Status: DC | PRN

## 2020-03-08 MED ORDER — WARFARIN SODIUM 5 MG PO TABS
10.0000 mg | ORAL_TABLET | Freq: Once | ORAL | Status: AC
  Administered 2020-03-08: 10 mg via ORAL
  Filled 2020-03-08: qty 2

## 2020-03-08 NOTE — Discharge Instructions (Signed)
Orthopaedic Trauma Service Discharge Instructions   General Discharge Instructions  Orthopaedic Injuries:  Left hip fracture treated with intramedullary nailing  WEIGHT BEARING STATUS: weightbear as tolerated L leg. Use walker for ambulation   RANGE OF MOTION/ACTIVITY: unrestricted range of motion L hip and knee   Bone health:  Vitamin d levels look good. Continue with home regimen. Recommend bone density scan in 4-8 weeks to further evaluate your bone health   Wound Care: daily wound care starting  On 03/09/2020.   Discharge Wound Care Instructions  Do NOT apply any ointments, solutions or lotions to pin sites or surgical wounds.  These prevent needed drainage and even though solutions like hydrogen peroxide kill bacteria, they also damage cells lining the pin sites that help fight infection.  Applying lotions or ointments can keep the wounds moist and can cause them to breakdown and open up as well. This can increase the risk for infection. When in doubt call the office.  Surgical incisions should be dressed daily.   If any drainage is noted, use one layer of adaptic, then gauze, and tape. Alternatively you can use a mepilex type dressing   Once the incision is completely dry and without drainage, it may be left open to air out.  Showering may begin 36-48 hours later.  Cleaning gently with soap and water.  DVT/PE prophylaxis: coumadin   Diet: as you were eating previously.  Can use over the counter stool softeners and bowel preparations, such as Miralax, to help with bowel movements.  Narcotics can be constipating.  Be sure to drink plenty of fluids  PAIN MEDICATION USE AND EXPECTATIONS  You have likely been given narcotic medications to help control your pain.  After a traumatic event that results in an fracture (broken bone) with or without surgery, it is ok to use narcotic pain medications to help control one's pain.  We understand that everyone responds to pain differently  and each individual patient will be evaluated on a regular basis for the continued need for narcotic medications. Ideally, narcotic medication use should last no more than 6-8 weeks (coinciding with fracture healing).   As a patient it is your responsibility as well to monitor narcotic medication use and report the amount and frequency you use these medications when you come to your office visit.   We would also advise that if you are using narcotic medications, you should take a dose prior to therapy to maximize you participation.  IF YOU ARE ON NARCOTIC MEDICATIONS IT IS NOT PERMISSIBLE TO OPERATE A MOTOR VEHICLE (MOTORCYCLE/CAR/TRUCK/MOPED) OR HEAVY MACHINERY DO NOT MIX NARCOTICS WITH OTHER CNS (CENTRAL NERVOUS SYSTEM) DEPRESSANTS SUCH AS ALCOHOL   STOP SMOKING OR USING NICOTINE PRODUCTS!!!!  As discussed nicotine severely impairs your body's ability to heal surgical and traumatic wounds but also impairs bone healing.  Wounds and bone heal by forming microscopic blood vessels (angiogenesis) and nicotine is a vasoconstrictor (essentially, shrinks blood vessels).  Therefore, if vasoconstriction occurs to these microscopic blood vessels they essentially disappear and are unable to deliver necessary nutrients to the healing tissue.  This is one modifiable factor that you can do to dramatically increase your chances of healing your injury.    (This means no smoking, no nicotine gum, patches, etc)  DO NOT USE NONSTEROIDAL ANTI-INFLAMMATORY DRUGS (NSAID'S)  Using products such as Advil (ibuprofen), Aleve (naproxen), Motrin (ibuprofen) for additional pain control during fracture healing can delay and/or prevent the healing response.  If you would like to  take over the counter (OTC) medication, Tylenol (acetaminophen) is ok.  However, some narcotic medications that are given for pain control contain acetaminophen as well. Therefore, you should not exceed more than 4000 mg of tylenol in a day if you do not  have liver disease.  Also note that there are may OTC medicines, such as cold medicines and allergy medicines that my contain tylenol as well.  If you have any questions about medications and/or interactions please ask your doctor/PA or your pharmacist.      ICE AND ELEVATE INJURED/OPERATIVE EXTREMITY  Using ice and elevating the injured extremity above your heart can help with swelling and pain control.  Icing in a pulsatile fashion, such as 20 minutes on and 20 minutes off, can be followed.    Do not place ice directly on skin. Make sure there is a barrier between to skin and the ice pack.    Using frozen items such as frozen peas works well as the conform nicely to the are that needs to be iced.  USE AN ACE WRAP OR TED HOSE FOR SWELLING CONTROL  In addition to icing and elevation, Ace wraps or TED hose are used to help limit and resolve swelling.  It is recommended to use Ace wraps or TED hose until you are informed to stop.    When using Ace Wraps start the wrapping distally (farthest away from the body) and wrap proximally (closer to the body)   Example: If you had surgery on your leg or thing and you do not have a splint on, start the ace wrap at the toes and work your way up to the thigh        If you had surgery on your upper extremity and do not have a splint on, start the ace wrap at your fingers and work your way up to the upper arm  IF YOU ARE IN A SPLINT OR CAST DO NOT [place]   If your splint gets wet for any reason please contact the office immediately. You may shower in your splint or cast as long as you keep it dry.  This can be done by wrapping in a cast cover or garbage back (or similar)  Do Not stick any thing down your splint or cast such as pencils, money, or hangers to try and scratch yourself with.  If you feel itchy take benadryl as prescribed on the bottle for itching  IF YOU ARE IN A CAM BOOT (BLACK BOOT)  You may remove boot periodically. Perform daily  dressing changes as noted below.  Wash the liner of the boot regularly and wear a sock when wearing the boot. It is recommended that you sleep in the boot until told otherwise    Call office for the following:  Temperature greater than 101F  Persistent nausea and vomiting  Severe uncontrolled pain  Redness, tenderness, or signs of infection (pain, swelling, redness, odor or green/yellow discharge around the site)  Difficulty breathing, headache or visual disturbances  Hives  Persistent dizziness or light-headedness  Extreme fatigue  Any other questions or concerns you may have after discharge  In an emergency, call 911 or go to an Emergency Department at a nearby hospital  HELPFUL INFORMATION  ? If you had a block, it will wear off between 8-24 hrs postop typically.  This is period when your pain may go from nearly zero to the pain you would have had postop without the block.  This is an abrupt transition but nothing dangerous is happening.  You may take an extra dose of narcotic when this happens.  ? You should wean off your narcotic medicines as soon as you are able.  Most patients will be off or using minimal narcotics before their first postop appointment.   ? We suggest you use the pain medication the first night prior to going to bed, in order to ease any pain when the anesthesia wears off. You should avoid taking pain medications on an empty stomach as it will make you nauseous.  ? Do not drink alcoholic beverages or take illicit drugs when taking pain medications.  ? In most states it is against the law to drive while you are in a splint or sling.  And certainly against the law to drive while taking narcotics.  ? You may return to work/school in the next couple of days when you feel up to it.   ? Pain medication may make you constipated.  Below are a few solutions to try in this order: - Decrease the amount of pain medication if you arent having pain. - Drink lots  of decaffeinated fluids. - Drink prune juice and/or each dried prunes  o If the first 3 dont work start with additional solutions - Take Colace - an over-the-counter stool softener - Take Senokot - an over-the-counter laxative - Take Miralax - a stronger over-the-counter laxative     CALL THE OFFICE WITH ANY QUESTIONS OR CONCERNS: [phone]   VISIT OUR WEBSITE FOR ADDITIONAL INFORMATION: [email or web address]

## 2020-03-08 NOTE — Care Management Important Message (Signed)
Important Message  Patient Details  Name: Ana [name] MRN: [id number] Date of Birth: 1945/07/30   Medicare Important Message Given:  Yes - Important Message mailed due to current National Emergency  Verbal consent obtained due to current National Emergency  Relationship to patient: Self Contact Name: Yuktha Kerchner Call Date: 03/08/20  Time: 1504 Phone: [phone] Outcome: No Answer/Busy Important Message mailed to: Patient address on file    Delorse Lek 03/08/2020, 3:04 PM

## 2020-03-08 NOTE — TOC CAGE-AID Note (Signed)
Transition of Care [facility]) - CAGE-AID Screening   Patient Details  Name: Ana [name] MRN: [id number] Date of Birth: [birth year]  Transition of Care [facility]) CM/SW Contact:    Emeterio Reeve, [place] Phone Number: 03/08/2020, 12:38 PM   Clinical Narrative:  CSW met with pt at bedside. CSW introduced self and explained role at the hospital.  Pt denies alcohol use. Pt denies substance use. Pt did not need any resources at this time.    CAGE-AID Screening:    Have You Ever Felt You Ought to Cut Down on Your Drinking or Drug Use?: No Have People Annoyed You By Critizing Your Drinking Or Drug Use?: No Have You Felt Bad Or Guilty About Your Drinking Or Drug Use?: No Have You Ever Had a Drink or Used Drugs First Thing In The Morning to Steady Your Nerves or to Get Rid of a Hangover?: No CAGE-AID Score: 0  Substance Abuse Education Offered: Yes     Blima Ledger, [place] Social Worker [phone]

## 2020-03-08 NOTE — Consult Note (Signed)
   [facility] CM Inpatient Consult   03/08/2020  Ana [name] Jul 18, 1945 [id number]    [employer] [ACO] Patient: Medicare  Patient will be followed by [place] Management PAC with Medicare [employer]. PCP: Dr. Allyn Kenner Plan: Will alert [facility] RN PAC nurse of disposition.  For questions, please contact:  Natividad Brood, RN BSN [facility] Liaison  [phone] business mobile phone Toll free office [phone]  Fax number: [phone] [name]

## 2020-03-08 NOTE — Progress Notes (Signed)
Physical Therapy Treatment Patient Details Name: Ana [name] MRN: [id number] DOB: 02-25-1946 Today's Date: 03/08/2020    History of Present Illness Ana [name] is a 74 y.o. female with medical history significant for hypertension, prosthetic left eye, aortic valve disease (Powell AVR 1998), chronic Lyme disease, CVA with left-sided weakness who presents to the emergency department due to a fall sustained at home yesterday (10/20). she states that she was in a warehouse with her son, she lost her balance and fell landing on a concrete floor with her left hip without hitting her head or losing consciousness, however, she complained of severe left hip pain and difficulty in being able to bear weight on the left hip.  . Pt found to have hip fx and underwent IM nailing on 10/23.    PT Comments    Pt was distracted by pain, her lack of energy and not getting her "B-12 shot".  Emphasis on ROM/flexion-ext exercise, transition to EOB, sit to stand and short distance ambulation/gait training with the RW.   Follow Up Recommendations  SNF     Equipment Recommendations  None recommended by PT;Other (comment)    Recommendations for Other Services       Precautions / Restrictions Precautions Precautions: Fall Restrictions LLE Weight Bearing: Weight bearing as tolerated    Mobility  Bed Mobility Overal bed mobility: Needs Assistance Bed Mobility: Supine to Sit     Supine to sit: Mod assist;+2 for physical assistance        Transfers Overall transfer level: Needs assistance Equipment used: Rolling walker (2 wheeled) Transfers: Sit to/from Stand Sit to Stand: Min assist;+2 safety/equipment         General transfer comment: pt able to scoot to EOB without assist, but needed forward and boost assist  Ambulation/Gait Ambulation/Gait assistance: Min assist;+2 physical assistance Gait Distance (Feet): 6 Feet (forward and back) Assistive device: Rolling walker (2 wheeled) Gait  Pattern/deviations: Step-to pattern;Decreased step length - right;Decreased step length - left;Decreased stance time - left;Decreased stride length Gait velocity: decr Gait velocity interpretation: <1.31 ft/sec, indicative of household ambulator General Gait Details: pt was antalgic on the L with inability to take a longer step on the R.   Stairs             Wheelchair Mobility    Modified Rankin (Stroke Patients Only)       Balance Overall balance assessment: Needs assistance;History of Falls Sitting-balance support: Feet supported Sitting balance-Leahy Scale: Good       Standing balance-Leahy Scale: Poor (to fair) Standing balance comment: reliant on UE support                            Cognition Arousal/Alertness: Awake/alert Behavior During Therapy: Anxious (mildly agitated) Overall Cognitive Status: No family/caregiver present to determine baseline cognitive functioning (not tested formally)                                 General Comments: followed simple commands, again tangential of thought      Exercises General Exercises - Lower Extremity Heel Slides: AROM;AAROM;Both;10 reps;Supine    General Comments General comments (skin integrity, edema, etc.): pt was self limiting, but did participate.  VSS      Pertinent Vitals/Pain Pain Assessment: Faces Faces Pain Scale: Hurts even more Pain Location: L hip with movement, even when adjusting bed position Pain Intervention(s):  Monitored during session;Premedicated before session    Home Living                      Prior Function            PT Goals (current goals can now be found in the care plan section) Acute Rehab PT Goals PT Goal Formulation: With patient Time For Goal Achievement: 03/19/20 Potential to Achieve Goals: Good Progress towards PT goals: Progressing toward goals    Frequency    Min 3X/week      PT Plan Current plan remains appropriate     Co-evaluation              AM-PAC PT "6 Clicks" Mobility   Outcome Measure  Help needed turning from your back to your side while in a flat bed without using bedrails?: A Lot Help needed moving from lying on your back to sitting on the side of a flat bed without using bedrails?: A Lot Help needed moving to and from a bed to a chair (including a wheelchair)?: A Lot Help needed standing up from a chair using your arms (e.g., wheelchair or bedside chair)?: A Little Help needed to walk in hospital room?: A Lot Help needed climbing 3-5 steps with a railing? : Total 6 Click Score: 12    End of Session   Activity Tolerance: Patient limited by pain;Patient limited by fatigue Patient left: in chair;with call bell/phone within reach;with chair alarm set Nurse Communication: Mobility status PT Visit Diagnosis: Muscle weakness (generalized) (M62.81);History of falling (Z91.81);Other abnormalities of gait and mobility (R26.89);Pain;Difficulty in walking, not elsewhere classified (R26.2) Pain - Right/Left: Left Pain - part of body: Leg     Time: 1030-1314 PT Time Calculation (min) (ACUTE ONLY): 22 min  Charges:  $Gait Training: 8-22 mins                     03/08/2020  Ginger Carne., PT Acute Rehabilitation Services [phone]  (pager) [phone]  (office)   Tessie Fass Yosgart Pavey 03/08/2020, 6:26 PM

## 2020-03-08 NOTE — Progress Notes (Signed)
Report given to [facility] at [place].  A updated covid test has not been done.  Will be collected tonight

## 2020-03-08 NOTE — Progress Notes (Signed)
2050  Patient transport arrived to come pick up patient to take to [facility]. COVID test taken this evening came back negative. Charge nurse Blanch Media notified [facility] to let them know about test results and about transport coming to take the patient. [facility] will not accept new patients after 8:30pm. Blanch Media made me aware and I let the transport team know. Will continue to monitor patient. Expected discharge tomorrow 10/26.

## 2020-03-08 NOTE — TOC Transition Note (Addendum)
Transition of Care [facility]) - CM/SW Discharge Note   Patient Details  Name: ARLESIA KIEL MRN: [id number] Date of Birth: [birth year]  Transition of Care [facility]) CM/SW Contact:  Emeterio Reeve, [place] Phone Number: 03/08/2020, 4:36 PM   Clinical Narrative:     Pt will discharge to [place] via ptar. Pts son [name] was notified.   Nurse to call report to [phone].  Final next level of care: Skilled Nursing Facility Barriers to Discharge: Barriers Resolved   Patient Goals and CMS Choice Patient states their goals for this hospitalization and ongoing recovery are:: "get back to as normal as I can" CMS Medicare.gov Compare Post Acute Care list provided to:: Patient Choice offered to / list presented to : Patient  Discharge Placement              Patient [facility] bed at: [facility] Patient to be transferred to facility by: ptar Name of family member notified: [name] Patient and family notified of of transfer: 03/08/20  Discharge Plan and Services     Post Acute Care Choice: [place]                               Social Determinants of Health (SDOH) Interventions     Readmission Risk Interventions No flowsheet data found.  Emeterio Reeve, Latanya Presser, [place] Social Worker [phone]

## 2020-03-08 NOTE — Plan of Care (Signed)

## 2020-03-08 NOTE — Progress Notes (Signed)
[place] for warfarin and enoxaparin Indication: hx mechanical AVR  Allergies  Allergen Reactions  . Blood-Group Specific Substance     NO BLOOD PRODUCTS    Patient Measurements: Height: 5\' 4"  (162.6 cm) Weight: 59 kg (130 lb) IBW/kg (Calculated) : 54.7  Vital Signs: Temp: 98.5 F (36.9 C) (10/25 0300) Temp Source: Oral (10/25 0300) BP: 130/62 (10/25 0300) Pulse Rate: 93 (10/25 0300)  Labs: Recent Labs    03/06/20 0205 03/06/20 0205 03/07/20 0245 03/08/20 0030  HGB 11.8*   < > 11.3* 11.6*  HCT 36.2  --  34.8* 35.5*  PLT 720*  --  678* 690*  LABPROT 15.7*  --  15.9* 15.4*  INR 1.3*  --  1.3* 1.3*  CREATININE 0.65  --  0.70 0.71   < > = values in this interval not displayed.    Estimated Creatinine Clearance: 54.1 mL/min (by C-G formula based on SCr of 0.71 mg/dL).  Assessment: 83 yoF admitted on 10/21 s/p fall. On warfarin PTA for hx mechanical AVR 1998, last dose taken 10/19. PMH significant for recent CVA and chronic dried periorbital bleeding. Patient is s/p IM nailing of left hip on 10/22. Pharmacy consulted to restart warfarin on POD1 and bridge with half dose enoxaparin.   INR remains subtherapeutic at 1.3 after two doses. CBC is stable. Pt is a Jehovah witness and will refuse blood transfusions.  PTA warfarin dose: 7 mg daily except 9 mg on Mon and Thurs  Goal of Therapy:  INR 2-3 Monitor platelets by anticoagulation protocol: Yes   Plan:  Repeat warfarin 10mg  PO x 1 today - will plan to increase dose tomorrow if no increase in INR Continue bridging with enoxaparin 30 mg SQ BID until INR therapeutic  Monitor daily INR, CBC, and s/s of bleeding  Salome Arnt, PharmD, BCPS Clinical Pharmacist Please see AMION for all pharmacy numbers 03/08/2020 8:20 AM

## 2020-03-08 NOTE — Discharge Summary (Addendum)
Physician Discharge Summary  Ana [name] SFK:[id number] DOB: 20-Jan-1946 DOA: 03/04/2020  PCP: Celene Squibb, MD  Admit date: 03/04/2020 Discharge date: 03/09/2020  Admitted From: Home Disposition:  SNF  Recommendations for Outpatient Follow-up:  Follow up with orthopedic surgery   Discharge Condition: Stable CODE STATUS: Full  Diet recommendation:  Diet Orders (From admission, onward)    Start     Ordered   03/05/20 1426  Diet regular Room service appropriate? Yes; Fluid consistency: Thin  Diet effective now       Question Answer Comment  Room service appropriate? Yes   Fluid consistency: Thin      03/05/20 1425          Brief/Interim Summary: Ana Billing Evansis a 74 y.o.femalewith medical history significant forhypertension, prosthetic left eye, aortic valve disease (Saint Jude AVR 1998), chronic Lyme disease, CVA with left-sided weakness who presents to the emergency department due to a fall sustained at home yesterday (10/20). Patient states that she had a stroke about 3 weeks ago. 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 Patient underwent IM nailing of the left hip 10/22 by Dr. Marcelino Scot. Patient had somnolence after 1 tablet of hydrocodone 5-357m and required narcan on 10/24. On day of discharge, patient was alert, awake, without new complaints.   Discharge Diagnoses:  Principal Problem:   Closed comminuted intertrochanteric fracture of proximal femur, left, initial encounter ([facility]  Problems:   Essential hypertension   Thrombocytosis   [place]cerebral vascular [place]) ([place]   Leukocytosis   Fall at home, initial encounter   Hyperglycemia   Closed left femoral fracture ([place]   Close comminuted intertrochanteric fracture of left proximal femur secondary to accidental fall -S/p IMN10/22/2021 -SNF placement recommended  -Judicious use of narcotics due to somnolence event requiring narcan   Acute blood loss anemia -Continue to monitor CBC, remains stable  History of CVA with left-sided weakness -Patient had a small recent (late acute to subacute) infarcts and right middle cerebral peduncle at the cortex of the posterior left frontal/parietal region -Continue Coumadin, Lovenox bridge  Essential hypertension -Continue Norvasc  History of Saint Jude AVR -Continue Coumadin, Lovenox bridge    Discharge Instructions  Discharge Instructions    Increase activity slowly   Complete by: As directed    No wound care   Complete by: As directed      Allergies as of 03/08/2020      Reactions   Blood-group Specific Substance    NO BLOOD PRODUCTS      Medication List    STOP taking these medications   amoxicillin 500 MG capsule Commonly known as: AMOXIL     TAKE these medications   acetaminophen 500 MG tablet Commonly known as: TYLENOL Take 1 tablet (500 mg total) by mouth every 8 (eight) hours as needed for mild pain or moderate pain.   amLODipine 10 MG tablet Commonly known as: NORVASC Take 10 mg by mouth every evening.   B-12 Compliance Injection 1000 MCG/ML Kit Generic drug: Cyanocobalamin Inject 1 mL  as directed See admin instructions. EVERY 4 DAYS   castor oil liquid daily. Cold Pressed castor oil ophthalmic daily   DHEA PO Take 14 mg by mouth daily.   ibuprofen 800 MG tablet Commonly known as: ADVIL Take 1 tablet (800 mg total) by mouth every 8 (eight) hours as needed for mild pain or moderate pain.   MAGNESIUM PO Take 1 tablet by  mouth daily.   melatonin 1 MG Tabs tablet Take 3 mg by mouth at bedtime.   oyster calcium 500 MG Tabs tablet Take 500 mg of elemental calcium by mouth 2 (two) times daily.   UNABLE TO FIND Take 15 mg by mouth every evening. Med Name: pregnenalone. SUPPLEMENT FOR FATIGUE   warfarin 6 MG tablet Commonly known as: COUMADIN Take 9 mg by mouth daily at 6 PM. Takes 1.5 tablet to equal 9 mg on Mondays and Thursdays and 7 mg all other days of the week   warfarin 1 MG tablet Commonly known as: COUMADIN Take 1 mg by mouth daily at 6 PM. Takes a 1 mg and 6 mg to equal 7 mg all days of the week except on Mondays and Thursdays and takes 9 mg       Contact information for follow-up providers    Altamese Livingston, MD Follow up.   Specialty: Orthopedic Surgery Contact information: [place] [address] [phone]            Contact information for after-discharge care    [facility] .   Service: Skilled Nursing Contact information: [address] [address] [phone]                 Allergies  Allergen Reactions  . Blood-Group Specific Substance     NO BLOOD PRODUCTS    Consultations:  Orthopedic surgery    Procedures/Studies: DG Pelvis 1-2 Views  Result Date: 03/05/2020 CLINICAL DATA:  ORIF left femur fracture EXAM: PELVIS - 1-2 VIEW COMPARISON:  Same day FINDINGS: Bones of the pelvis appear normal. Gamma nail placement on the left for treatment of an intertrochanteric fracture of the femur. Components appear well positioned. No radiographically detectable complication. IMPRESSION: Good appearance following ORIF for intertrochanteric fracture of the left femur. Electronically Signed   By: Nelson Chimes M.D.   On: 03/05/2020 15:36   CT Head Wo Contrast  Result Date: 03/04/2020 CLINICAL DATA:  Reported recent CVA.  Fall. EXAM: CT HEAD WITHOUT CONTRAST TECHNIQUE: Contiguous axial images  were obtained from the base of the skull through the vertex without intravenous contrast. COMPARISON:  Brain MRI February 16, 2017 FINDINGS: Brain: There is a degree of cerebellar atrophy. Ventricles and sulci elsewhere are commensurate with age. There is no intracranial mass, hemorrhage, extra-axial fluid collection, or midline shift. There is evidence of prior infarct in the right posterior inferior cerebellar region. There is decreased attenuation in a portion of the right dentate nucleus which potentially could represent recent infarct. There is evidence of a prior infarct in the anterior left centrum semiovale. There is patchy small vessel disease in the centra semiovale as well. Vascular: No appreciable hyperdense vessel. There is calcification in each carotid siphon region. Skull: The bony calvarium appears intact. Sinuses/Orbits: There is mucosal thickening in several ethmoid air cells. There is a prosthetic globe on the left. There is a cystic appearing area posterior to the prosthesis in the left lobe region measuring 1.8 x 1.8 cm. The  right orbit appears normal. Other: Mastoid air cells are clear. IMPRESSION: 1. Age uncertain but potentially recent infarct in the right cerebellar dentate nucleus. Nearby older infarct in the posteroinferior cerebellar region, present on prior MR examination. 2. Prior infarct in the anterior left centrum semiovale. There is mild patchy small vessel disease in the centra semiovale bilaterally. 3.  No mass or hemorrhage. 4.  There are foci of arterial vascular calcification. 5. 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 Cystic appearing area is located in the left orbit immediately posterior to the prosthesis on the left of uncertain etiology. 6.  Mucosal thickening in several ethmoid air cells noted. Electronically Signed   By: Lowella Grip III M.D.   On: 03/04/2020 08:35   DG Chest Port 1 View  Result Date: 03/04/2020 CLINICAL DATA:  Preoperative for left hip fracture. History  of hypertension, aortic valve replacement, former smoker. EXAM: PORTABLE CHEST 1 VIEW COMPARISON:  04/23/2017 FINDINGS: Postoperative changes in the mediastinum. Normal heart size and pulmonary vascularity. Emphysematous changes in the lungs. Diffuse interstitial pattern to the lungs with peribronchial thickening likely due to chronic bronchitis. No pleural effusions. No focal consolidation. No pneumothorax. Mediastinal contours appear intact. Calcification of the aorta. IMPRESSION: Emphysematous and chronic bronchitic changes in the lungs. No evidence of active pulmonary disease. Electronically Signed   By: Lucienne Capers M.D.   On: 03/04/2020 04:26   DG C-Arm 1-60 Min  Result Date: 03/05/2020 CLINICAL DATA:  Left femoral fracture, ORIF EXAM: DG C-ARM 1-60 MIN; LEFT FEMUR 2 VIEWS CONTRAST:  None FLUOROSCOPY TIME:  Fluoroscopy Time:  1 minutes 39 seconds Radiation Exposure Index (if provided by the fluoroscopic device): 13.51 mGy Number of Acquired Spot Images: 8 COMPARISON:  03/04/2020 FINDINGS: Eight fluoroscopic intraoperative radiographs demonstrate open reduction internal fixation of a intratrochanteric left femoral neck fracture with a a long intramedullary rod and axial screw. No unexpected fracture or dislocation. Normal alignment on this limited examination. IMPRESSION: Left femoral neck ORIF.  No unexpected fracture or dislocation. Electronically Signed   By: Fidela Salisbury MD   On: 03/05/2020 12:51   DG Hip Unilat W or Wo Pelvis 2-3 Views Left  Result Date: 03/04/2020 CLINICAL DATA:  Hip pain after a fall last night. EXAM: DG HIP (WITH OR WITHOUT PELVIS) 2-3V LEFT COMPARISON:  None. FINDINGS: There is an acute comminuted inter trochanteric fracture of the proximal left femur with varus angulation. No dislocation at the hip joint. Degenerative changes are seen in both hips. Pelvis appears intact. IMPRESSION: Acute comminuted intertrochanteric fracture of the proximal left femur with varus  angulation. Electronically Signed   By: Lucienne Capers M.D.   On: 03/04/2020 03:53   DG FEMUR MIN 2 VIEWS LEFT  Result Date: 03/05/2020 CLINICAL DATA:  ORIF femur fracture EXAM: LEFT FEMUR 2 VIEWS COMPARISON:  Earlier same day FINDINGS: Gamma nail fixation of an intertrochanteric fracture. Alignment appears anatomic. Locking screw in the proximal to mid femoral diaphyseal region. IMPRESSION: Good appearance following ORIF for intertrochanteric fracture. Electronically Signed   By: Nelson Chimes M.D.   On: 03/05/2020 15:35   DG FEMUR MIN 2 VIEWS LEFT  Result Date: 03/05/2020 CLINICAL DATA:  Left femoral fracture, ORIF EXAM: DG C-ARM 1-60 MIN; LEFT FEMUR 2 VIEWS CONTRAST:  None FLUOROSCOPY TIME:  Fluoroscopy Time:  1 minutes 39 seconds Radiation Exposure Index (if provided by the fluoroscopic device): 13.51 mGy Number of Acquired Spot Images: 8 COMPARISON:  03/04/2020 FINDINGS: Eight fluoroscopic intraoperative radiographs demonstrate open reduction internal fixation of a  intratrochanteric left femoral neck fracture with a a long intramedullary rod and axial screw. No unexpected fracture or dislocation. Normal alignment on this limited examination. IMPRESSION: Left femoral neck ORIF.  No unexpected fracture or dislocation. Electronically Signed   By: Fidela Salisbury MD   On: 03/05/2020 12:51       Discharge Exam: Vitals:   03/08/20 0300 03/08/20 0827  BP: 130/62 (!) 152/59  Pulse: 93 88  Resp: 18 20  Temp: 98.5 F (36.9 C) 98.2 F (36.8 C)  SpO2: 100% 94%    General: Pt is alert, awake, not in acute distress Cardiovascular: RRR, S1/S2 +, no edema Respiratory: CTA bilaterally, no wheezing, no rhonchi, no respiratory distress, no conversational dyspnea  Abdominal: Soft, NT, ND, bowel sounds + Extremities: no edema, no cyanosis Psych: Normal mood and affect, stable judgement and insight     The results of significant diagnostics from this hospitalization (including imaging,  microbiology, ancillary and laboratory) are listed below for reference.     Microbiology: Recent Results (from the past 240 hour(s))  Respiratory Panel by RT PCR (Flu A&B, Covid) - Nasopharyngeal Swab     Status: None   Collection Time: 03/04/20  5:43 AM   Specimen: Nasopharyngeal Swab  Result Value Ref Range Status   SARS Coronavirus 2 by RT PCR NEGATIVE NEGATIVE Final    Comment: (NOTE) SARS-CoV-2 target nucleic acids are NOT DETECTED.  The SARS-CoV-2 RNA is generally detectable in upper respiratoy specimens during the acute phase of infection. The lowest concentration of SARS-CoV-2 viral copies this assay can detect is 131 copies/mL. A negative result does not preclude SARS-Cov-2 infection and should not be used as the sole basis for treatment or other patient management decisions. A negative result may occur with  improper specimen collection/handling, submission of specimen other than nasopharyngeal swab, presence of viral mutation(s) within the areas targeted by this assay, and inadequate number of viral copies (<131 copies/mL). A negative result must be combined with clinical observations, patient history, and epidemiological information. The expected result is Negative.  Fact Sheet for Patients:  PinkCheek.be  Fact Sheet for Healthcare Providers:  GravelBags.it  This test is no t yet approved or cleared by the [place] FDA and  has been authorized for detection and/or diagnosis of SARS-CoV-2 by FDA under an Emergency Use Authorization (EUA). This EUA will remain  in effect (meaning this test can be used) for the duration of the COVID-19 declaration under Section 564(b)(1) of the Act, 21 U.S.C. section 360bbb-3(b)(1), unless the authorization is terminated or revoked sooner.     Influenza A by PCR NEGATIVE NEGATIVE Final   Influenza B by PCR NEGATIVE NEGATIVE Final    Comment: (NOTE) The Xpert Xpress  SARS-CoV-2/FLU/RSV assay is intended as an aid in  the diagnosis of influenza from Nasopharyngeal swab specimens and  should not be used as a sole basis for treatment. Nasal washings and  aspirates are unacceptable for Xpert Xpress SARS-CoV-2/FLU/RSV  testing.  Fact Sheet for Patients: PinkCheek.be  Fact Sheet for Healthcare Providers: GravelBags.it  This test is not yet approved or cleared by the [place] FDA and  has been authorized for detection and/or diagnosis of SARS-CoV-2 by  FDA under an Emergency Use Authorization (EUA). This EUA will remain  in effect (meaning this test can be used) for the duration of the  Covid-19 declaration under Section 564(b)(1) of the Act, 21  U.S.C. section 360bbb-3(b)(1), unless the authorization is  terminated or revoked. Performed at [facility]  [address] [place], [place] [address]   Surgical pcr screen     Status: None   Collection Time: 03/04/20 11:48 PM   Specimen: Nasal Mucosa; Nasal Swab  Result Value Ref Range Status   MRSA, PCR NEGATIVE NEGATIVE Final   Staphylococcus aureus NEGATIVE NEGATIVE Final    Comment: (NOTE) The Xpert SA Assay (FDA approved for NASAL specimens in patients 74 years of age and older), is one component of a comprehensive surveillance program. It is not intended to diagnose infection nor to guide or monitor treatment. Performed at [facility], [place] [address], [place], [place] [address]      Labs: BNP (last 3 results) No results for input(s): BNP in the last 8760 hours. Basic Metabolic Panel: Recent Labs  Lab 03/04/20 0459 03/05/20 0743 03/06/20 0205 03/07/20 0245 03/08/20 0030  NA 136 137 139 140 139  K 4.0 3.9 3.8 3.4* 4.0  CL 100 102 103 103 103  CO2 25 25 30 30 27   GLUCOSE 161* 118* 131* 117* 107*  BUN 26* 13 10 8 19   CREATININE 0.67 0.72 0.65 0.70 0.71  CALCIUM 9.0 8.6* 8.8* 8.4* 8.5*  MG  --   --   --   --  1.8    Liver Function Tests: Recent Labs  Lab 03/04/20 0459  AST 28  ALT 24  ALKPHOS 53  BILITOT 0.7  PROT 7.5  ALBUMIN 4.4   No results for input(s): LIPASE, AMYLASE in the last 168 hours. No results for input(s): AMMONIA in the last 168 hours. CBC: Recent Labs  Lab 03/04/20 0459 03/05/20 0743 03/06/20 0205 03/07/20 0245 03/08/20 0030  WBC 22.3* 14.9* 12.6* 11.4* 12.7*  NEUTROABS 19.7* 11.1*  --   --   --   HGB 14.0 13.0 11.8* 11.3* 11.6*  HCT 42.0 40.2 36.2 34.8* 35.5*  MCV 89.4 91.4 91.9 91.8 90.8  PLT 1,054* 765* 720* 678* 690*   Cardiac Enzymes: No results for input(s): CKTOTAL, CKMB, CKMBINDEX, TROPONINI in the last 168 hours. BNP: Invalid input(s): POCBNP CBG: Recent Labs  Lab 03/07/20 0551  GLUCAP 127*   D-Dimer No results for input(s): DDIMER in the last 72 hours. Hgb A1c Recent Labs    03/05/20 1400  HGBA1C 5.6   Lipid Profile No results for input(s): CHOL, HDL, LDLCALC, TRIG, CHOLHDL, LDLDIRECT in the last 72 hours. Thyroid function studies No results for input(s): TSH, T4TOTAL, T3FREE, THYROIDAB in the last 72 hours.  Invalid input(s): FREET3 Anemia work up No results for input(s): VITAMINB12, FOLATE, FERRITIN, TIBC, IRON, RETICCTPCT in the last 72 hours. Urinalysis    Component Value Date/Time   COLORURINE YELLOW 03/04/2020 0411   APPEARANCEUR CLEAR 03/04/2020 0411   LABSPEC 1.018 03/04/2020 0411   PHURINE 6.0 03/04/2020 0411   GLUCOSEU 50 (A) 03/04/2020 0411   HGBUR NEGATIVE 03/04/2020 0411   BILIRUBINUR NEGATIVE 03/04/2020 0411   KETONESUR 5 (A) 03/04/2020 0411   PROTEINUR NEGATIVE 03/04/2020 0411   UROBILINOGEN >8.0 (H) 11/27/2010 1021   NITRITE NEGATIVE 03/04/2020 0411   LEUKOCYTESUR NEGATIVE 03/04/2020 0411   Sepsis Labs Invalid input(s): PROCALCITONIN,  WBC,  LACTICIDVEN Microbiology Recent Results (from the past 240 hour(s))  Respiratory Panel by RT PCR (Flu A&B, Covid) - Nasopharyngeal Swab     Status: None   Collection  Time: 03/04/20  5:43 AM   Specimen: Nasopharyngeal Swab  Result Value Ref Range Status   SARS Coronavirus 2 by RT PCR NEGATIVE NEGATIVE Final    Comment: (NOTE)  SARS-CoV-2 target nucleic acids are NOT DETECTED.  The SARS-CoV-2 RNA is generally detectable in upper respiratoy specimens during the acute phase of infection. The lowest concentration of SARS-CoV-2 viral copies this assay can detect is 131 copies/mL. A negative result does not preclude SARS-Cov-2 infection and should not be used as the sole basis for treatment or other patient management decisions. A negative result may occur with  improper specimen collection/handling, submission of specimen other than nasopharyngeal swab, presence of viral mutation(s) within the areas targeted by this assay, and inadequate number of viral copies (<131 copies/mL). A negative result must be combined with clinical observations, patient history, and epidemiological information. The expected result is Negative.  Fact Sheet for Patients:  PinkCheek.be  Fact Sheet for Healthcare Providers:  GravelBags.it  This test is no t yet approved or cleared by the [place] FDA and  has been authorized for detection and/or diagnosis of SARS-CoV-2 by FDA under an Emergency Use Authorization (EUA). This EUA will remain  in effect (meaning this test can be used) for the duration of the COVID-19 declaration under Section 564(b)(1) of the Act, 21 U.S.C. section 360bbb-3(b)(1), unless the authorization is terminated or revoked sooner.     Influenza A by PCR NEGATIVE NEGATIVE Final   Influenza B by PCR NEGATIVE NEGATIVE Final    Comment: (NOTE) The Xpert Xpress SARS-CoV-2/FLU/RSV assay is intended as an aid in  the diagnosis of influenza from Nasopharyngeal swab specimens and  should not be used as a sole basis for treatment. Nasal washings and  aspirates are unacceptable for Xpert Xpress  SARS-CoV-2/FLU/RSV  testing.  Fact Sheet for Patients: PinkCheek.be  Fact Sheet for Healthcare Providers: GravelBags.it  This test is not yet approved or cleared by the [place] FDA and  has been authorized for detection and/or diagnosis of SARS-CoV-2 by  FDA under an Emergency Use Authorization (EUA). This EUA will remain  in effect (meaning this test can be used) for the duration of the  Covid-19 declaration under Section 564(b)(1) of the Act, 21  U.S.C. section 360bbb-3(b)(1), unless the authorization is  terminated or revoked. Performed at [facility], [address], [place], [place] [address]   Surgical pcr screen     Status: None   Collection Time: 03/04/20 11:48 PM   Specimen: Nasal Mucosa; Nasal Swab  Result Value Ref Range Status   MRSA, PCR NEGATIVE NEGATIVE Final   Staphylococcus aureus NEGATIVE NEGATIVE Final    Comment: (NOTE) The Xpert SA Assay (FDA approved for NASAL specimens in patients 74 years of age and older), is one component of a comprehensive surveillance program. It is not intended to diagnose infection nor to guide or monitor treatment. Performed at [facility], [place] [name], [place], [place] [address]      Patient was seen and examined on the day of discharge and was found to be in stable condition. Time coordinating discharge: 35 minutes including assessment and coordination of care, as well as examination of the patient.   SIGNED:  Dessa Phi, DO [facility] 03/08/2020, 11:50 AM

## 2020-03-08 NOTE — Progress Notes (Addendum)
Orthopaedic Trauma Service Progress Note  Patient ID: Ana [name] MRN: [id number] DOB/AGE: 73-12-1945 73 y.o.  Subjective:  Doing fair L hip is sore No specific complaints this am    ROS No CP or SOB  Objective:   VITALS:   Vitals:   03/07/20 1504 03/07/20 1945 03/08/20 0300 03/08/20 0827  BP: (!) 171/79 136/66 130/62 (!) 152/59  Pulse: 100 97 93 88  Resp: 18 17 18 20   Temp: 98.9 F (37.2 C) 98.3 F (36.8 C) 98.5 F (36.9 C) 98.2 F (36.8 C)  TempSrc: Oral Oral Oral Oral  SpO2: 97% 100% 100% 94%  Weight:      Height:        Estimated body mass index is 22.31 kg/m as calculated from the following:   Height as of this encounter: 5\' 4"  (1.626 m).   Weight as of this encounter: 59 kg.   Intake/Output      10/24 0701 - 10/25 0700 10/25 0701 - 10/26 0700   P.O. 240 360   Total Intake(mL/kg) 240 (4.1) 360 (6.1)   Net +240 +360        Urine Occurrence 3 x    Stool Occurrence 1 x      LABS  Results for orders placed or performed during the hospital encounter of 03/04/20 (from the past 24 hour(s))  CBC     Status: Abnormal   Collection Time: 03/08/20 12:30 AM  Result Value Ref Range   WBC 12.7 (H) 4.0 - 10.5 K/uL   RBC 3.91 3.87 - 5.11 MIL/uL   Hemoglobin 11.6 (L) 12.0 - 15.0 g/dL   HCT 35.5 (L) 36 - 46 %   MCV 90.8 80.0 - 100.0 fL   MCH 29.7 26.0 - 34.0 pg   MCHC 32.7 30.0 - 36.0 g/dL   RDW 14.3 11.5 - 15.5 %   Platelets 690 (H) 150 - 400 K/uL   nRBC 0.0 0.0 - 0.2 %  Protime-INR     Status: Abnormal   Collection Time: 03/08/20 12:30 AM  Result Value Ref Range   Prothrombin Time 15.4 (H) 11.4 - 15.2 seconds   INR 1.3 (H) 0.8 - 1.2  Basic metabolic panel     Status: Abnormal   Collection Time: 03/08/20 12:30 AM  Result Value Ref Range   Sodium 139 135 - 145 mmol/L   Potassium 4.0 3.5 - 5.1 mmol/L   Chloride 103 98 - 111 mmol/L   CO2 27 22 - 32 mmol/L   Glucose, Bld 107 (H) 70  - 99 mg/dL   BUN 19 8 - 23 mg/dL   Creatinine, Ser 0.71 0.44 - 1.00 mg/dL   Calcium 8.5 (L) 8.9 - 10.3 mg/dL   GFR, Estimated >60 >60 mL/min   Anion gap 9 5 - 15  Magnesium     Status: None   Collection Time: 03/08/20 12:30 AM  Result Value Ref Range   Magnesium 1.8 1.7 - 2.4 mg/dL     PHYSICAL EXAM:   Gen: resting comfortably in bed, NAD,  Lungs: unlabored Ext:       Left Lower Extremity   Dressings stable   Scant strikethrough noted   Incisions look great  Ext warm  + DP pulse  No DCT   Compartments are soft   No pain with  passive stretch   Distal motor and sensory functions intact   Assessment/Plan: 3 Days Post-Op   Principal Problem:   Closed comminuted intertrochanteric fracture of proximal femur, left, initial encounter [facility]) Active Problems:   Essential hypertension   Thrombocytosis   CVA (cerebral vascular accident) ([place])   Leukocytosis   Fall at home, initial encounter   Hyperglycemia   Closed left femoral fracture ([place])   Anti-infectives (From admission, onward)   Start     Dose/Rate Route Frequency Ordered Stop   03/05/20 0600  ceFAZolin (ANCEF) IVPB 2g/100 mL premix        2 g 200 mL/hr over 30 Minutes Intravenous To Short Stay 03/04/20 2242 03/05/20 1100    .  POD/HD#: 24  74 y/o female s/p fall with L hip fracture   - fall   - L hip fracture s/p IMN   WBAT L leg  ROM as tolerated L hip and knee  Dressing changes PRN   PT/OT  Ice PRN pain/swelling     - Pain management:  Minimize narcotics  Scheduled tylenol  - ABL anemia/Hemodynamics  Stable  Jehovah's witness   - Medical issues   Per primary   - DVT/PE prophylaxis:  Coumadin PTA   lovenox bridge until therapeutic  - ID:   periop abx completed  - Metabolic Bone Disease:  Vitamin d levels look good  Fracture and mechanism suggestive of osteoporosis   - Impediments to fracture healing:  Low energy fracture    - Dispo:  Ortho issues stable  SW consult   Follow up  with ortho in 10-14 days   Jari Pigg, PA-C [phone] (C) 03/08/2020, 10:26 AM  [facility] [place] [address] [phone] Jenetta [phone] (F)    After 5pm and on the weekends please log on to Amion, go to orthopaedics and the look under the [facility] Call for the provider(s) on call. You can also call our office at [phone] and then follow the prompts to be connected to the call team.

## 2020-03-09 DIAGNOSIS — Z8619 Personal history of other infectious and parasitic diseases: Secondary | ICD-10-CM | POA: Diagnosis not present

## 2020-03-09 DIAGNOSIS — E611 Iron deficiency: Secondary | ICD-10-CM | POA: Diagnosis not present

## 2020-03-09 DIAGNOSIS — M2550 Pain in unspecified joint: Secondary | ICD-10-CM | POA: Diagnosis not present

## 2020-03-09 DIAGNOSIS — R262 Difficulty in walking, not elsewhere classified: Secondary | ICD-10-CM | POA: Diagnosis not present

## 2020-03-09 DIAGNOSIS — K589 Irritable bowel syndrome without diarrhea: Secondary | ICD-10-CM | POA: Diagnosis not present

## 2020-03-09 DIAGNOSIS — Z7401 Bed confinement status: Secondary | ICD-10-CM | POA: Diagnosis not present

## 2020-03-09 DIAGNOSIS — Z7689 Persons encountering health services in other specified circumstances: Secondary | ICD-10-CM | POA: Diagnosis not present

## 2020-03-09 DIAGNOSIS — R0902 Hypoxemia: Secondary | ICD-10-CM | POA: Diagnosis not present

## 2020-03-09 DIAGNOSIS — R5382 Chronic fatigue, unspecified: Secondary | ICD-10-CM | POA: Diagnosis not present

## 2020-03-09 DIAGNOSIS — Y92009 Unspecified place in unspecified non-institutional (private) residence as the place of occurrence of the external cause: Secondary | ICD-10-CM | POA: Diagnosis not present

## 2020-03-09 DIAGNOSIS — D649 Anemia, unspecified: Secondary | ICD-10-CM | POA: Diagnosis not present

## 2020-03-09 DIAGNOSIS — Z4789 Encounter for other orthopedic aftercare: Secondary | ICD-10-CM | POA: Diagnosis not present

## 2020-03-09 DIAGNOSIS — D849 Immunodeficiency, unspecified: Secondary | ICD-10-CM | POA: Diagnosis not present

## 2020-03-09 DIAGNOSIS — S72142D Displaced intertrochanteric fracture of left femur, subsequent encounter for closed fracture with routine healing: Secondary | ICD-10-CM | POA: Diagnosis not present

## 2020-03-09 DIAGNOSIS — I639 Cerebral infarction, unspecified: Secondary | ICD-10-CM | POA: Diagnosis not present

## 2020-03-09 DIAGNOSIS — D509 Iron deficiency anemia, unspecified: Secondary | ICD-10-CM | POA: Diagnosis not present

## 2020-03-09 DIAGNOSIS — M6281 Muscle weakness (generalized): Secondary | ICD-10-CM | POA: Diagnosis not present

## 2020-03-09 DIAGNOSIS — D75839 Thrombocytosis, unspecified: Secondary | ICD-10-CM | POA: Diagnosis not present

## 2020-03-09 DIAGNOSIS — J99 Respiratory disorders in diseases classified elsewhere: Secondary | ICD-10-CM | POA: Diagnosis not present

## 2020-03-09 DIAGNOSIS — S72009A Fracture of unspecified part of neck of unspecified femur, initial encounter for closed fracture: Secondary | ICD-10-CM | POA: Diagnosis not present

## 2020-03-09 DIAGNOSIS — I10 Essential (primary) hypertension: Secondary | ICD-10-CM | POA: Diagnosis not present

## 2020-03-09 DIAGNOSIS — F29 Unspecified psychosis not due to a substance or known physiological condition: Secondary | ICD-10-CM | POA: Diagnosis not present

## 2020-03-09 DIAGNOSIS — W19XXXD Unspecified fall, subsequent encounter: Secondary | ICD-10-CM | POA: Diagnosis not present

## 2020-03-09 DIAGNOSIS — J42 Unspecified chronic bronchitis: Secondary | ICD-10-CM | POA: Diagnosis not present

## 2020-03-09 DIAGNOSIS — R531 Weakness: Secondary | ICD-10-CM | POA: Diagnosis not present

## 2020-03-09 DIAGNOSIS — D72829 Elevated white blood cell count, unspecified: Secondary | ICD-10-CM | POA: Diagnosis not present

## 2020-03-09 LAB — PROTIME-INR
INR: 1.6 — ABNORMAL HIGH (ref 0.8–1.2)
Prothrombin Time: 18.5 seconds — ABNORMAL HIGH (ref 11.4–15.2)

## 2020-03-09 MED ORDER — ASCORBIC ACID 500 MG PO TABS
500.0000 mg | ORAL_TABLET | Freq: Every day | ORAL | Status: DC
  Administered 2020-03-09: 500 mg via ORAL
  Filled 2020-03-09: qty 1

## 2020-03-09 MED ORDER — CYANOCOBALAMIN 1000 MCG/ML IJ SOLN
1000.0000 ug | Freq: Once | INTRAMUSCULAR | Status: AC
  Administered 2020-03-09: 1000 ug via INTRAMUSCULAR
  Filled 2020-03-09: qty 1

## 2020-03-09 NOTE — Plan of Care (Signed)

## 2020-03-09 NOTE — Plan of Care (Signed)
See progress note.

## 2020-03-09 NOTE — TOC Transition Note (Signed)
Transition of Care [facility]) - CM/SW Discharge Note   Patient Details  Name: Ana [name] MRN: [id number] Date of Birth: 13-Oct-1945  Transition of Care [facility]) CM/SW Contact:  Emeterio Reeve, [place] Phone Number: 03/09/2020, 10:01 AM   Clinical Narrative:     Pt to discharge to [facility]. Pts covid was negative. Pt and son notified. Ptar has been called  Nurse to call report to [phone].  Final next level of care: Skilled Nursing Facility Barriers to Discharge: Barriers Resolved   Patient Goals and CMS Choice Patient states their goals for this hospitalization and ongoing recovery are:: "get back to as normal as I can" CMS Medicare.gov Compare Post Acute Care list provided to:: Patient Choice offered to / list presented to : Patient  Discharge Placement              Patient chooses bed at: [facility] Patient to be transferred to facility by: ptar Name of family member notified: [name] Patient and family notified of of transfer: 03/08/20  Discharge Plan and Services     Post Acute Care Choice: [place]                               Social Determinants of Health (SDOH) Interventions     Readmission Risk Interventions No flowsheet data found.  Emeterio Reeve, Latanya Presser, [place] Social Worker [phone]

## 2020-03-09 NOTE — Progress Notes (Signed)
  PROGRESS NOTE  Patient remains stable for discharge to SNF. She wants vit B12 shot and vit C before she goes. Ordered.    Dessa Phi, DO [facility] 03/09/2020, 9:14 AM  Available via Epic secure chat 7am-7pm After these hours, please refer to coverage provider listed on [email or web address]

## 2020-03-09 NOTE — Progress Notes (Signed)
Report called to [facility] at [facility]

## 2020-03-11 DIAGNOSIS — I639 Cerebral infarction, unspecified: Secondary | ICD-10-CM | POA: Diagnosis not present

## 2020-03-11 DIAGNOSIS — Z8619 Personal history of other infectious and parasitic diseases: Secondary | ICD-10-CM | POA: Diagnosis not present

## 2020-03-11 DIAGNOSIS — D75839 Thrombocytosis, unspecified: Secondary | ICD-10-CM | POA: Diagnosis not present

## 2020-03-11 DIAGNOSIS — D649 Anemia, unspecified: Secondary | ICD-10-CM | POA: Diagnosis not present

## 2020-03-11 DIAGNOSIS — I10 Essential (primary) hypertension: Secondary | ICD-10-CM | POA: Diagnosis not present

## 2020-03-11 DIAGNOSIS — S72009A Fracture of unspecified part of neck of unspecified femur, initial encounter for closed fracture: Secondary | ICD-10-CM | POA: Diagnosis not present

## 2020-03-16 DIAGNOSIS — Z8619 Personal history of other infectious and parasitic diseases: Secondary | ICD-10-CM | POA: Diagnosis not present

## 2020-03-16 DIAGNOSIS — D649 Anemia, unspecified: Secondary | ICD-10-CM | POA: Diagnosis not present

## 2020-03-16 DIAGNOSIS — Z7689 Persons encountering health services in other specified circumstances: Secondary | ICD-10-CM | POA: Diagnosis not present

## 2020-03-16 DIAGNOSIS — D75839 Thrombocytosis, unspecified: Secondary | ICD-10-CM | POA: Diagnosis not present

## 2020-03-16 DIAGNOSIS — I639 Cerebral infarction, unspecified: Secondary | ICD-10-CM | POA: Diagnosis not present

## 2020-03-16 DIAGNOSIS — I10 Essential (primary) hypertension: Secondary | ICD-10-CM | POA: Diagnosis not present

## 2020-03-16 DIAGNOSIS — S72009A Fracture of unspecified part of neck of unspecified femur, initial encounter for closed fracture: Secondary | ICD-10-CM | POA: Diagnosis not present

## 2020-03-16 DIAGNOSIS — S72142D Displaced intertrochanteric fracture of left femur, subsequent encounter for closed fracture with routine healing: Secondary | ICD-10-CM | POA: Diagnosis not present

## 2020-03-17 ENCOUNTER — Other Ambulatory Visit: Payer: Self-pay | Admitting: *Deleted

## 2020-03-17 NOTE — Patient Outreach (Signed)
[facility] Post- Acute Care Coordinator follow up. Member screened for potential [facility] Care Management needs as a benefit of [place] Medicare.  Ana [name] is receiving skilled therapy at [facility] SNF. Communication sent to facility SW to inquire about anticipated transition plans and potential [facility] Care Management services.  Will continue to follow while member resides in SNF.  Marthenia Rolling, MSN-Ed, RN,BSN [place] Acute Care Coordinator [phone] [facility]) [phone]  (Toll free office)

## 2020-03-23 ENCOUNTER — Other Ambulatory Visit: Payer: Self-pay | Admitting: *Deleted

## 2020-03-23 NOTE — Patient Outreach (Signed)
[place] Coordinator follow up. Member screened for potential [facility] Care Management needs as a benefit of [place] Medicare.  Update previously received from [facility] indicating member transitioned home on 11/5, per family request. [facility] home health was arranged.   Telephone call made to Ms. Amalia Hailey [phone] to discuss [place] Management services. No answer. Left HIPAA compliant voicemail to request return call.  Marthenia Rolling, MSN-Ed, RN,BSN [place] Acute Care Coordinator [phone] [facility]) [phone]  (Toll free office)

## 2020-03-24 DIAGNOSIS — S72142D Displaced intertrochanteric fracture of left femur, subsequent encounter for closed fracture with routine healing: Secondary | ICD-10-CM | POA: Diagnosis not present

## 2020-04-01 ENCOUNTER — Encounter (HOSPITAL_COMMUNITY): Payer: Self-pay | Admitting: *Deleted

## 2020-04-01 ENCOUNTER — Emergency Department (HOSPITAL_COMMUNITY)
Admission: EM | Admit: 2020-04-01 | Discharge: 2020-04-01 | Disposition: A | Discharge status: Home / Self Care | Payer: Medicare Other | Attending: Emergency Medicine | Admitting: Emergency Medicine

## 2020-04-01 ENCOUNTER — Other Ambulatory Visit: Payer: Self-pay

## 2020-04-01 ENCOUNTER — Emergency Department (HOSPITAL_COMMUNITY): Payer: Medicare Other

## 2020-04-01 DIAGNOSIS — Z952 Presence of prosthetic heart valve: Secondary | ICD-10-CM | POA: Diagnosis not present

## 2020-04-01 DIAGNOSIS — Z8673 Personal history of transient ischemic attack (TIA), and cerebral infarction without residual deficits: Secondary | ICD-10-CM | POA: Diagnosis not present

## 2020-04-01 DIAGNOSIS — Z20822 Contact with and (suspected) exposure to covid-19: Secondary | ICD-10-CM | POA: Diagnosis not present

## 2020-04-01 DIAGNOSIS — Z79899 Other long term (current) drug therapy: Secondary | ICD-10-CM | POA: Diagnosis not present

## 2020-04-01 DIAGNOSIS — R791 Abnormal coagulation profile: Secondary | ICD-10-CM | POA: Diagnosis not present

## 2020-04-01 DIAGNOSIS — I10 Essential (primary) hypertension: Secondary | ICD-10-CM | POA: Insufficient documentation

## 2020-04-01 DIAGNOSIS — R319 Hematuria, unspecified: Secondary | ICD-10-CM | POA: Diagnosis present

## 2020-04-01 DIAGNOSIS — R059 Cough, unspecified: Secondary | ICD-10-CM | POA: Insufficient documentation

## 2020-04-01 DIAGNOSIS — R310 Gross hematuria: Secondary | ICD-10-CM

## 2020-04-01 DIAGNOSIS — N390 Urinary tract infection, site not specified: Secondary | ICD-10-CM | POA: Insufficient documentation

## 2020-04-01 DIAGNOSIS — R079 Chest pain, unspecified: Secondary | ICD-10-CM | POA: Diagnosis not present

## 2020-04-01 DIAGNOSIS — R21 Rash and other nonspecific skin eruption: Secondary | ICD-10-CM | POA: Diagnosis not present

## 2020-04-01 DIAGNOSIS — Z87891 Personal history of nicotine dependence: Secondary | ICD-10-CM | POA: Insufficient documentation

## 2020-04-01 DIAGNOSIS — M25551 Pain in right hip: Secondary | ICD-10-CM | POA: Diagnosis not present

## 2020-04-01 HISTORY — DX: Cerebral infarction, unspecified: I63.9

## 2020-04-01 LAB — CBC WITH DIFFERENTIAL/PLATELET
Abs Immature Granulocytes: 0.08 10*3/uL — ABNORMAL HIGH (ref 0.00–0.07)
Basophils Absolute: 0.1 10*3/uL (ref 0.0–0.1)
Basophils Relative: 1 %
Eosinophils Absolute: 0.1 10*3/uL (ref 0.0–0.5)
Eosinophils Relative: 1 %
HCT: 42.2 % (ref 36.0–46.0)
Hemoglobin: 13.5 g/dL (ref 12.0–15.0)
Immature Granulocytes: 1 %
Lymphocytes Relative: 8 %
Lymphs Abs: 1.2 10*3/uL (ref 0.7–4.0)
MCH: 29.1 pg (ref 26.0–34.0)
MCHC: 32 g/dL (ref 30.0–36.0)
MCV: 90.9 fL (ref 80.0–100.0)
Monocytes Absolute: 1.9 10*3/uL — ABNORMAL HIGH (ref 0.1–1.0)
Monocytes Relative: 12 %
Neutro Abs: 12.1 10*3/uL — ABNORMAL HIGH (ref 1.7–7.7)
Neutrophils Relative %: 77 %
Platelets: 890 10*3/uL — ABNORMAL HIGH (ref 150–400)
RBC: 4.64 MIL/uL (ref 3.87–5.11)
RDW: 14.6 % (ref 11.5–15.5)
WBC: 15.4 10*3/uL — ABNORMAL HIGH (ref 4.0–10.5)
nRBC: 0 % (ref 0.0–0.2)

## 2020-04-01 LAB — URINALYSIS, ROUTINE W REFLEX MICROSCOPIC

## 2020-04-01 LAB — RESP PANEL BY RT-PCR (FLU A&B, COVID) ARPGX2
Influenza A by PCR: NEGATIVE
Influenza B by PCR: NEGATIVE
SARS Coronavirus 2 by RT PCR: NEGATIVE

## 2020-04-01 LAB — BASIC METABOLIC PANEL
Anion gap: 9 (ref 5–15)
BUN: 25 mg/dL — ABNORMAL HIGH (ref 8–23)
CO2: 26 mmol/L (ref 22–32)
Calcium: 9.2 mg/dL (ref 8.9–10.3)
Chloride: 102 mmol/L (ref 98–111)
Creatinine, Ser: 0.72 mg/dL (ref 0.44–1.00)
GFR, Estimated: >60 mL/min (ref >60)
Glucose, Bld: 115 mg/dL — ABNORMAL HIGH (ref 70–99)
Potassium: 3.5 mmol/L (ref 3.5–5.1)
Sodium: 137 mmol/L (ref 135–145)

## 2020-04-01 LAB — URINALYSIS, MICROSCOPIC (REFLEX): RBC / HPF: >50 RBC/hpf (ref 0–5)

## 2020-04-01 LAB — PROTIME-INR
INR: 5.6 (ref 0.8–1.2)
Prothrombin Time: 49.1 seconds — ABNORMAL HIGH (ref 11.4–15.2)

## 2020-04-01 MED ORDER — PHYTONADIONE 5 MG PO TABS
2.5000 mg | ORAL_TABLET | Freq: Once | ORAL | Status: DC
  Filled 2020-04-01: qty 1

## 2020-04-01 MED ORDER — CEPHALEXIN 500 MG PO CAPS: 500.0000 mg | ORAL_CAPSULE | Freq: Three times a day (TID) | ORAL | 0 refills | Status: DC

## 2020-04-01 MED ORDER — HYDROCODONE-ACETAMINOPHEN 5-325 MG PO TABS
1.0000 | ORAL_TABLET | Freq: Once | ORAL | Status: AC
  Administered 2020-04-01: 1 via ORAL
  Filled 2020-04-01: qty 1

## 2020-04-01 MED ORDER — SODIUM CHLORIDE 0.9 % IV SOLN
1.0000 g | Freq: Once | INTRAVENOUS | Status: AC
  Administered 2020-04-01: 1 g via INTRAVENOUS
  Filled 2020-04-01: qty 10

## 2020-04-01 NOTE — Discharge Instructions (Signed)
You were seen today for several complaints 1 of which was gross bloody urine.  Your INR was elevated at 5.6.  Hold Coumadin for the next 2 days.  Follow-up with your primary physician for recheck of INR.  Additionally, you may have a UTI.  Take medications as prescribed.  If you develop fevers or back pain, you should be reevaluated.

## 2020-04-01 NOTE — ED Notes (Signed)
Received care of patient resting in bed.  0 s/s acute distress.  Call bell in reach. 

## 2020-04-01 NOTE — ED Triage Notes (Signed)
Pt noted blood in urine the other day.  Pt has one good eye and vision is poor to that eye per pt. Pt c/o right thigh.
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
## 2020-04-01 NOTE — ED Notes (Signed)
Patient discharged home to caregiver.  Patient receptive to discharge instructions and able to demonstrate knowledge/understanding via teachback method.  0 s/s acute distress.  Wheelchair out of ED.

## 2020-04-01 NOTE — ED Notes (Signed)
Patient lying on stretcher with a washcloth over her eyes.  Offered to ambulate in hallway per MD order.  Patient states she feels weak and not up for that right now.

## 2020-04-03 LAB — URINE CULTURE: Culture: <10000 — AB

## 2020-04-06 DIAGNOSIS — D473 Essential (hemorrhagic) thrombocythemia: Secondary | ICD-10-CM | POA: Diagnosis not present

## 2020-04-06 DIAGNOSIS — R0902 Hypoxemia: Secondary | ICD-10-CM | POA: Diagnosis not present

## 2020-04-06 DIAGNOSIS — Z6823 Body mass index (BMI) 23.0-23.9, adult: Secondary | ICD-10-CM | POA: Diagnosis not present

## 2020-04-06 DIAGNOSIS — S0570XA Avulsion of unspecified eye, initial encounter: Secondary | ICD-10-CM | POA: Diagnosis not present

## 2020-04-06 DIAGNOSIS — Z7901 Long term (current) use of anticoagulants: Secondary | ICD-10-CM | POA: Diagnosis not present

## 2020-04-06 DIAGNOSIS — I10 Essential (primary) hypertension: Secondary | ICD-10-CM | POA: Diagnosis not present

## 2020-04-06 DIAGNOSIS — E782 Mixed hyperlipidemia: Secondary | ICD-10-CM | POA: Diagnosis not present

## 2020-04-06 DIAGNOSIS — A6929 Other conditions associated with Lyme disease: Secondary | ICD-10-CM | POA: Diagnosis not present

## 2020-04-06 DIAGNOSIS — I4891 Unspecified atrial fibrillation: Secondary | ICD-10-CM | POA: Diagnosis not present

## 2020-04-06 DIAGNOSIS — Z954 Presence of other heart-valve replacement: Secondary | ICD-10-CM | POA: Diagnosis not present

## 2020-04-06 DIAGNOSIS — Z8679 Personal history of other diseases of the circulatory system: Secondary | ICD-10-CM | POA: Diagnosis not present

## 2020-04-06 DIAGNOSIS — D6869 Other thrombophilia: Secondary | ICD-10-CM | POA: Diagnosis not present

## 2020-04-06 DIAGNOSIS — D696 Thrombocytopenia, unspecified: Secondary | ICD-10-CM | POA: Diagnosis not present

## 2020-04-06 DIAGNOSIS — A6920 Lyme disease, unspecified: Secondary | ICD-10-CM | POA: Diagnosis not present

## 2020-04-13 DIAGNOSIS — A6929 Other conditions associated with Lyme disease: Secondary | ICD-10-CM | POA: Diagnosis not present

## 2020-04-13 DIAGNOSIS — N390 Urinary tract infection, site not specified: Secondary | ICD-10-CM | POA: Diagnosis not present

## 2020-04-13 DIAGNOSIS — Z7901 Long term (current) use of anticoagulants: Secondary | ICD-10-CM | POA: Diagnosis not present

## 2020-04-13 DIAGNOSIS — D6869 Other thrombophilia: Secondary | ICD-10-CM | POA: Diagnosis not present

## 2020-04-13 DIAGNOSIS — Z954 Presence of other heart-valve replacement: Secondary | ICD-10-CM | POA: Diagnosis not present

## 2020-04-13 DIAGNOSIS — R0902 Hypoxemia: Secondary | ICD-10-CM | POA: Diagnosis not present

## 2020-04-21 DIAGNOSIS — S72142D Displaced intertrochanteric fracture of left femur, subsequent encounter for closed fracture with routine healing: Secondary | ICD-10-CM | POA: Diagnosis not present

## 2020-04-22 DIAGNOSIS — Z7901 Long term (current) use of anticoagulants: Secondary | ICD-10-CM | POA: Diagnosis not present

## 2020-04-22 DIAGNOSIS — A6929 Other conditions associated with Lyme disease: Secondary | ICD-10-CM | POA: Diagnosis not present

## 2020-04-22 DIAGNOSIS — D6869 Other thrombophilia: Secondary | ICD-10-CM | POA: Diagnosis not present

## 2020-04-22 DIAGNOSIS — Z954 Presence of other heart-valve replacement: Secondary | ICD-10-CM | POA: Diagnosis not present

## 2020-04-22 DIAGNOSIS — R0902 Hypoxemia: Secondary | ICD-10-CM | POA: Diagnosis not present

## 2020-12-27 ENCOUNTER — Emergency Department (HOSPITAL_COMMUNITY): Payer: Medicare Other

## 2020-12-27 ENCOUNTER — Encounter (HOSPITAL_COMMUNITY): Payer: Self-pay | Admitting: Adult Health

## 2020-12-27 ENCOUNTER — Encounter (HOSPITAL_COMMUNITY): Payer: Self-pay

## 2020-12-27 ENCOUNTER — Inpatient Hospital Stay (HOSPITAL_COMMUNITY)
Admission: EM | Admit: 2020-12-27 | Discharge: 2021-01-05 | DRG: 813 | Disposition: A | Discharge status: Skilled Nursing Facility | Payer: Medicare Other | Attending: Internal Medicine | Admitting: Internal Medicine

## 2020-12-27 ENCOUNTER — Other Ambulatory Visit: Payer: Self-pay

## 2020-12-27 DIAGNOSIS — S300XXA Contusion of lower back and pelvis, initial encounter: Secondary | ICD-10-CM | POA: Diagnosis not present

## 2020-12-27 DIAGNOSIS — T148XXA Other injury of unspecified body region, initial encounter: Secondary | ICD-10-CM | POA: Diagnosis present

## 2020-12-27 DIAGNOSIS — Z20822 Contact with and (suspected) exposure to covid-19: Secondary | ICD-10-CM | POA: Diagnosis present

## 2020-12-27 DIAGNOSIS — B9620 Unspecified Escherichia coli [E. coli] as the cause of diseases classified elsewhere: Secondary | ICD-10-CM | POA: Diagnosis present

## 2020-12-27 DIAGNOSIS — Z043 Encounter for examination and observation following other accident: Secondary | ICD-10-CM | POA: Diagnosis not present

## 2020-12-27 DIAGNOSIS — N3001 Acute cystitis with hematuria: Secondary | ICD-10-CM

## 2020-12-27 DIAGNOSIS — E785 Hyperlipidemia, unspecified: Secondary | ICD-10-CM | POA: Diagnosis present

## 2020-12-27 DIAGNOSIS — D599 Acquired hemolytic anemia, unspecified: Secondary | ICD-10-CM | POA: Diagnosis present

## 2020-12-27 DIAGNOSIS — Z79899 Other long term (current) drug therapy: Secondary | ICD-10-CM | POA: Diagnosis not present

## 2020-12-27 DIAGNOSIS — D6832 Hemorrhagic disorder due to extrinsic circulating anticoagulants: Principal | ICD-10-CM | POA: Diagnosis present

## 2020-12-27 DIAGNOSIS — D6489 Other specified anemias: Secondary | ICD-10-CM | POA: Diagnosis present

## 2020-12-27 DIAGNOSIS — R82998 Other abnormal findings in urine: Secondary | ICD-10-CM | POA: Diagnosis present

## 2020-12-27 DIAGNOSIS — I639 Cerebral infarction, unspecified: Secondary | ICD-10-CM | POA: Diagnosis present

## 2020-12-27 DIAGNOSIS — Z806 Family history of leukemia: Secondary | ICD-10-CM

## 2020-12-27 DIAGNOSIS — Z515 Encounter for palliative care: Secondary | ICD-10-CM | POA: Diagnosis not present

## 2020-12-27 DIAGNOSIS — M1990 Unspecified osteoarthritis, unspecified site: Secondary | ICD-10-CM | POA: Diagnosis present

## 2020-12-27 DIAGNOSIS — H5440 Blindness, one eye, unspecified eye: Secondary | ICD-10-CM | POA: Diagnosis present

## 2020-12-27 DIAGNOSIS — T148XXD Other injury of unspecified body region, subsequent encounter: Secondary | ICD-10-CM | POA: Diagnosis present

## 2020-12-27 DIAGNOSIS — B9629 Other Escherichia coli [E. coli] as the cause of diseases classified elsewhere: Secondary | ICD-10-CM | POA: Diagnosis present

## 2020-12-27 DIAGNOSIS — R791 Abnormal coagulation profile: Secondary | ICD-10-CM | POA: Diagnosis present

## 2020-12-27 DIAGNOSIS — R531 Weakness: Secondary | ICD-10-CM | POA: Diagnosis present

## 2020-12-27 DIAGNOSIS — N390 Urinary tract infection, site not specified: Secondary | ICD-10-CM | POA: Diagnosis present

## 2020-12-27 DIAGNOSIS — Z7189 Other specified counseling: Secondary | ICD-10-CM | POA: Diagnosis not present

## 2020-12-27 DIAGNOSIS — D45 Polycythemia vera: Secondary | ICD-10-CM | POA: Diagnosis present

## 2020-12-27 DIAGNOSIS — I10 Essential (primary) hypertension: Secondary | ICD-10-CM | POA: Diagnosis present

## 2020-12-27 DIAGNOSIS — S279XXA Injury of unspecified intrathoracic organ, initial encounter: Secondary | ICD-10-CM | POA: Diagnosis present

## 2020-12-27 DIAGNOSIS — D751 Secondary polycythemia: Secondary | ICD-10-CM | POA: Diagnosis present

## 2020-12-27 DIAGNOSIS — R739 Hyperglycemia, unspecified: Secondary | ICD-10-CM | POA: Diagnosis present

## 2020-12-27 DIAGNOSIS — R5381 Other malaise: Secondary | ICD-10-CM | POA: Diagnosis present

## 2020-12-27 DIAGNOSIS — R5382 Chronic fatigue, unspecified: Secondary | ICD-10-CM | POA: Diagnosis present

## 2020-12-27 DIAGNOSIS — K449 Diaphragmatic hernia without obstruction or gangrene: Secondary | ICD-10-CM | POA: Diagnosis not present

## 2020-12-27 DIAGNOSIS — D72829 Elevated white blood cell count, unspecified: Secondary | ICD-10-CM | POA: Diagnosis present

## 2020-12-27 DIAGNOSIS — R262 Difficulty in walking, not elsewhere classified: Secondary | ICD-10-CM | POA: Diagnosis present

## 2020-12-27 DIAGNOSIS — Z531 Procedure and treatment not carried out because of patient's decision for reasons of belief and group pressure: Secondary | ICD-10-CM | POA: Diagnosis present

## 2020-12-27 DIAGNOSIS — Z8673 Personal history of transient ischemic attack (TIA), and cerebral infarction without residual deficits: Secondary | ICD-10-CM | POA: Diagnosis not present

## 2020-12-27 DIAGNOSIS — G40909 Epilepsy, unspecified, not intractable, without status epilepticus: Secondary | ICD-10-CM | POA: Diagnosis present

## 2020-12-27 DIAGNOSIS — Z7901 Long term (current) use of anticoagulants: Secondary | ICD-10-CM | POA: Diagnosis not present

## 2020-12-27 DIAGNOSIS — I728 Aneurysm of other specified arteries: Secondary | ICD-10-CM | POA: Diagnosis not present

## 2020-12-27 DIAGNOSIS — S279XXD Injury of unspecified intrathoracic organ, subsequent encounter: Secondary | ICD-10-CM | POA: Diagnosis not present

## 2020-12-27 DIAGNOSIS — T1490XA Injury, unspecified, initial encounter: Secondary | ICD-10-CM

## 2020-12-27 DIAGNOSIS — IMO0001 Reserved for inherently not codable concepts without codable children: Secondary | ICD-10-CM

## 2020-12-27 DIAGNOSIS — D649 Anemia, unspecified: Secondary | ICD-10-CM | POA: Diagnosis not present

## 2020-12-27 DIAGNOSIS — M6281 Muscle weakness (generalized): Secondary | ICD-10-CM | POA: Diagnosis present

## 2020-12-27 DIAGNOSIS — D75839 Thrombocytosis, unspecified: Secondary | ICD-10-CM | POA: Diagnosis present

## 2020-12-27 DIAGNOSIS — T45515A Adverse effect of anticoagulants, initial encounter: Secondary | ICD-10-CM | POA: Diagnosis present

## 2020-12-27 DIAGNOSIS — J90 Pleural effusion, not elsewhere classified: Secondary | ICD-10-CM | POA: Diagnosis not present

## 2020-12-27 DIAGNOSIS — M7981 Nontraumatic hematoma of soft tissue: Secondary | ICD-10-CM | POA: Diagnosis present

## 2020-12-27 DIAGNOSIS — Z87891 Personal history of nicotine dependence: Secondary | ICD-10-CM

## 2020-12-27 DIAGNOSIS — Z8249 Family history of ischemic heart disease and other diseases of the circulatory system: Secondary | ICD-10-CM | POA: Diagnosis not present

## 2020-12-27 DIAGNOSIS — Z7401 Bed confinement status: Secondary | ICD-10-CM

## 2020-12-27 DIAGNOSIS — I359 Nonrheumatic aortic valve disorder, unspecified: Secondary | ICD-10-CM | POA: Diagnosis present

## 2020-12-27 DIAGNOSIS — S0990XA Unspecified injury of head, initial encounter: Secondary | ICD-10-CM | POA: Diagnosis not present

## 2020-12-27 DIAGNOSIS — I6930 Unspecified sequelae of cerebral infarction: Secondary | ICD-10-CM | POA: Diagnosis not present

## 2020-12-27 DIAGNOSIS — Z952 Presence of prosthetic heart valve: Secondary | ICD-10-CM | POA: Diagnosis not present

## 2020-12-27 DIAGNOSIS — S2020XA Contusion of thorax, unspecified, initial encounter: Secondary | ICD-10-CM | POA: Diagnosis not present

## 2020-12-27 DIAGNOSIS — S301XXA Contusion of abdominal wall, initial encounter: Secondary | ICD-10-CM | POA: Diagnosis not present

## 2020-12-27 DIAGNOSIS — Z8619 Personal history of other infectious and parasitic diseases: Secondary | ICD-10-CM | POA: Diagnosis not present

## 2020-12-27 DIAGNOSIS — I6523 Occlusion and stenosis of bilateral carotid arteries: Secondary | ICD-10-CM | POA: Diagnosis not present

## 2020-12-27 DIAGNOSIS — H5462 Unqualified visual loss, left eye, normal vision right eye: Secondary | ICD-10-CM | POA: Diagnosis present

## 2020-12-27 DIAGNOSIS — K589 Irritable bowel syndrome without diarrhea: Secondary | ICD-10-CM | POA: Diagnosis present

## 2020-12-27 DIAGNOSIS — D688 Other specified coagulation defects: Secondary | ICD-10-CM | POA: Diagnosis present

## 2020-12-27 LAB — CBC WITH DIFFERENTIAL/PLATELET
Abs Immature Granulocytes: 0.24 10*3/uL — ABNORMAL HIGH (ref 0.00–0.07)
Basophils Absolute: 0.2 10*3/uL — ABNORMAL HIGH (ref 0.0–0.1)
Basophils Relative: 1 %
Eosinophils Absolute: 0 10*3/uL (ref 0.0–0.5)
Eosinophils Relative: 0 %
HCT: 36.4 % (ref 36.0–46.0)
Hemoglobin: 12.2 g/dL (ref 12.0–15.0)
Immature Granulocytes: 1 %
Lymphocytes Relative: 3 %
Lymphs Abs: 0.9 10*3/uL (ref 0.7–4.0)
MCH: 30.3 pg (ref 26.0–34.0)
MCHC: 33.5 g/dL (ref 30.0–36.0)
MCV: 90.3 fL (ref 80.0–100.0)
Monocytes Absolute: 2.5 10*3/uL — ABNORMAL HIGH (ref 0.1–1.0)
Monocytes Relative: 8 %
Neutro Abs: 27.5 10*3/uL — ABNORMAL HIGH (ref 1.7–7.7)
Neutrophils Relative %: 87 %
Platelets: 1180 10*3/uL (ref 150–400)
RBC: 4.03 MIL/uL (ref 3.87–5.11)
RDW: 14 % (ref 11.5–15.5)
WBC: 31.3 10*3/uL — ABNORMAL HIGH (ref 4.0–10.5)
nRBC: 0 % (ref 0.0–0.2)

## 2020-12-27 LAB — URINALYSIS, ROUTINE W REFLEX MICROSCOPIC
Bilirubin Urine: NEGATIVE
Glucose, UA: 50 mg/dL — AB
Ketones, ur: 5 mg/dL — AB
Nitrite: POSITIVE — AB
Protein, ur: 30 mg/dL — AB
Specific Gravity, Urine: 1.025 (ref 1.005–1.030)
pH: 5 (ref 5.0–8.0)

## 2020-12-27 LAB — RESP PANEL BY RT-PCR (FLU A&B, COVID) ARPGX2
Influenza A by PCR: NEGATIVE
Influenza B by PCR: NEGATIVE
SARS Coronavirus 2 by RT PCR: NEGATIVE

## 2020-12-27 LAB — BASIC METABOLIC PANEL
Anion gap: 9 (ref 5–15)
BUN: 27 mg/dL — ABNORMAL HIGH (ref 8–23)
CO2: 24 mmol/L (ref 22–32)
Calcium: 8.8 mg/dL — ABNORMAL LOW (ref 8.9–10.3)
Chloride: 101 mmol/L (ref 98–111)
Creatinine, Ser: 0.93 mg/dL (ref 0.44–1.00)
GFR, Estimated: >60 mL/min (ref >60)
Glucose, Bld: 212 mg/dL — ABNORMAL HIGH (ref 70–99)
Potassium: 3.6 mmol/L (ref 3.5–5.1)
Sodium: 134 mmol/L — ABNORMAL LOW (ref 135–145)

## 2020-12-27 LAB — PROTIME-INR
INR: >10 (ref 0.8–1.2)
Prothrombin Time: >90 seconds — ABNORMAL HIGH (ref 11.4–15.2)

## 2020-12-27 LAB — LACTIC ACID, PLASMA
Lactic Acid, Venous: 1.5 mmol/L (ref 0.5–1.9)
Lactic Acid, Venous: 1.1 mmol/L (ref 0.5–1.9)

## 2020-12-27 MED ORDER — ACETAMINOPHEN 325 MG PO TABS
650.0000 mg | ORAL_TABLET | Freq: Once | ORAL | Status: AC
  Administered 2020-12-27: 650 mg via ORAL

## 2020-12-27 MED ORDER — PHYTONADIONE 5 MG PO TABS
2.5000 mg | ORAL_TABLET | Freq: Once | ORAL | Status: AC
  Administered 2020-12-27: 2.5 mg via ORAL
  Filled 2020-12-27: qty 1

## 2020-12-27 MED ORDER — SODIUM CHLORIDE 0.9 % IV BOLUS
500.0000 mL | Freq: Once | INTRAVENOUS | Status: AC
  Administered 2020-12-27: 500 mL via INTRAVENOUS

## 2020-12-27 MED ORDER — IOHEXOL 350 MG/ML SOLN
80.0000 mL | Freq: Once | INTRAVENOUS | Status: AC | PRN
  Administered 2020-12-27: 80 mL via INTRAVENOUS

## 2020-12-27 MED ORDER — SODIUM CHLORIDE 0.9 % IV SOLN
1.0000 g | Freq: Once | INTRAVENOUS | Status: AC
  Administered 2020-12-27: 1 g via INTRAVENOUS
  Filled 2020-12-27: qty 10
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
## 2020-12-27 NOTE — ED Notes (Signed)
Pt wants to be checked for her old bruising to left lower leg.  Pt nor son can explain the bruising.

## 2020-12-27 NOTE — ED Triage Notes (Signed)
Pt. States they have been having back pain for awhile but can't say for how long. Pt. States they need an INR.

## 2020-12-28 ENCOUNTER — Encounter (HOSPITAL_COMMUNITY): Payer: Self-pay | Admitting: Adult Health

## 2020-12-28 ENCOUNTER — Encounter (HOSPITAL_COMMUNITY): Payer: Self-pay | Admitting: Family Medicine

## 2020-12-28 DIAGNOSIS — Z531 Procedure and treatment not carried out because of patient's decision for reasons of belief and group pressure: Secondary | ICD-10-CM

## 2020-12-28 DIAGNOSIS — N3001 Acute cystitis with hematuria: Secondary | ICD-10-CM | POA: Insufficient documentation

## 2020-12-28 DIAGNOSIS — S279XXA Injury of unspecified intrathoracic organ, initial encounter: Secondary | ICD-10-CM | POA: Diagnosis present

## 2020-12-28 DIAGNOSIS — T148XXA Other injury of unspecified body region, initial encounter: Secondary | ICD-10-CM | POA: Diagnosis present

## 2020-12-28 DIAGNOSIS — R791 Abnormal coagulation profile: Secondary | ICD-10-CM | POA: Diagnosis not present

## 2020-12-28 LAB — HEPATIC FUNCTION PANEL
ALT: 19 U/L (ref 0–44)
AST: 28 U/L (ref 15–41)
Albumin: 3.8 g/dL (ref 3.5–5.0)
Alkaline Phosphatase: 72 U/L (ref 38–126)
Bilirubin, Direct: 0.1 mg/dL (ref 0.0–0.2)
Indirect Bilirubin: 0.6 mg/dL (ref 0.3–0.9)
Total Bilirubin: 0.7 mg/dL (ref 0.3–1.2)
Total Protein: 7.7 g/dL (ref 6.5–8.1)

## 2020-12-28 LAB — CBC
HCT: 32.1 % — ABNORMAL LOW (ref 36.0–46.0)
HCT: 31.8 % — ABNORMAL LOW (ref 36.0–46.0)
Hemoglobin: 10.7 g/dL — ABNORMAL LOW (ref 12.0–15.0)
Hemoglobin: 10.3 g/dL — ABNORMAL LOW (ref 12.0–15.0)
MCH: 29.6 pg (ref 26.0–34.0)
MCH: 29.3 pg (ref 26.0–34.0)
MCHC: 33.3 g/dL (ref 30.0–36.0)
MCHC: 32.4 g/dL (ref 30.0–36.0)
MCV: 88.9 fL (ref 80.0–100.0)
MCV: 90.6 fL (ref 80.0–100.0)
Platelets: 845 10*3/uL — ABNORMAL HIGH (ref 150–400)
Platelets: 932 10*3/uL (ref 150–400)
RBC: 3.61 MIL/uL — ABNORMAL LOW (ref 3.87–5.11)
RBC: 3.51 MIL/uL — ABNORMAL LOW (ref 3.87–5.11)
RDW: 13.8 % (ref 11.5–15.5)
RDW: 14.3 % (ref 11.5–15.5)
WBC: 25.6 10*3/uL — ABNORMAL HIGH (ref 4.0–10.5)
WBC: 26.2 10*3/uL — ABNORMAL HIGH (ref 4.0–10.5)
nRBC: 0 % (ref 0.0–0.2)
nRBC: 0 % (ref 0.0–0.2)

## 2020-12-28 LAB — BASIC METABOLIC PANEL
Anion gap: 13 (ref 5–15)
BUN: 21 mg/dL (ref 8–23)
CO2: 21 mmol/L — ABNORMAL LOW (ref 22–32)
Calcium: 8.5 mg/dL — ABNORMAL LOW (ref 8.9–10.3)
Chloride: 101 mmol/L (ref 98–111)
Creatinine, Ser: 0.58 mg/dL (ref 0.44–1.00)
GFR, Estimated: >60 mL/min (ref >60)
Glucose, Bld: 146 mg/dL — ABNORMAL HIGH (ref 70–99)
Potassium: 4 mmol/L (ref 3.5–5.1)
Sodium: 135 mmol/L (ref 135–145)

## 2020-12-28 LAB — PROTIME-INR
INR: >10 (ref 0.8–1.2)
INR: >10 (ref 0.8–1.2)
INR: 1.9 — ABNORMAL HIGH (ref 0.8–1.2)
Prothrombin Time: >90 seconds — ABNORMAL HIGH (ref 11.4–15.2)
Prothrombin Time: 84.4 seconds — ABNORMAL HIGH (ref 11.4–15.2)
Prothrombin Time: 21.7 seconds — ABNORMAL HIGH (ref 11.4–15.2)

## 2020-12-28 LAB — HEMOGLOBIN AND HEMATOCRIT, BLOOD
HCT: 30.3 % — ABNORMAL LOW (ref 36.0–46.0)
Hemoglobin: 9.9 g/dL — ABNORMAL LOW (ref 12.0–15.0)

## 2020-12-28 LAB — HEMOGLOBIN A1C
Hgb A1c MFr Bld: 5.9 % — ABNORMAL HIGH (ref 4.8–5.6)
Mean Plasma Glucose: 122.63 mg/dL

## 2020-12-28 LAB — GLUCOSE, CAPILLARY: Glucose-Capillary: 135 mg/dL — ABNORMAL HIGH (ref 70–99)

## 2020-12-28 MED ORDER — ONDANSETRON HCL 4 MG/2ML IJ SOLN: 4.0000 mg | Freq: Four times a day (QID) | INTRAMUSCULAR | Status: DC | PRN

## 2020-12-28 MED ORDER — AMLODIPINE BESYLATE 5 MG PO TABS
5.0000 mg | ORAL_TABLET | Freq: Every day | ORAL | Status: DC
  Administered 2020-12-28 – 2020-12-30 (×3): 5 mg via ORAL
  Filled 2020-12-28 (×3): qty 1

## 2020-12-28 MED ORDER — MELATONIN 3 MG PO TABS: 3.0000 mg | ORAL_TABLET | Freq: Every evening | ORAL | Status: DC | PRN

## 2020-12-28 MED ORDER — SENNOSIDES-DOCUSATE SODIUM 8.6-50 MG PO TABS: 1.0000 | ORAL_TABLET | Freq: Every evening | ORAL | Status: DC | PRN

## 2020-12-28 MED ORDER — ONDANSETRON HCL 4 MG PO TABS: 4.0000 mg | ORAL_TABLET | Freq: Four times a day (QID) | ORAL | Status: DC | PRN

## 2020-12-28 MED ORDER — PANTOPRAZOLE SODIUM 40 MG PO TBEC
40.0000 mg | DELAYED_RELEASE_TABLET | Freq: Every day | ORAL | Status: DC
  Administered 2020-12-28 – 2021-01-04 (×8): 40 mg via ORAL
  Filled 2020-12-28 (×9): qty 1

## 2020-12-28 MED ORDER — PHYTONADIONE 5 MG PO TABS
2.5000 mg | ORAL_TABLET | Freq: Once | ORAL | Status: AC
  Administered 2020-12-28: 2.5 mg via ORAL
  Filled 2020-12-28: qty 1

## 2020-12-28 MED ORDER — VITAMIN K1 10 MG/ML IJ SOLN: 5.0000 mg | Freq: Once | INTRAVENOUS | Status: DC

## 2020-12-28 MED ORDER — MORPHINE SULFATE (PF) 2 MG/ML IV SOLN
2.0000 mg | INTRAVENOUS | Status: DC | PRN
  Administered 2020-12-28 – 2020-12-30 (×8): 2 mg via INTRAVENOUS
  Filled 2020-12-28 (×8): qty 1

## 2020-12-28 MED ORDER — CEFTRIAXONE SODIUM 1 G IJ SOLR: 1.0000 g | INTRAMUSCULAR | Status: DC

## 2020-12-28 MED ORDER — OXYCODONE HCL 5 MG PO TABS
5.0000 mg | ORAL_TABLET | ORAL | Status: DC | PRN
  Administered 2020-12-28: 5 mg via ORAL
  Filled 2020-12-28: qty 1

## 2020-12-28 MED ORDER — HYDROCODONE-ACETAMINOPHEN 5-325 MG PO TABS
1.0000 | ORAL_TABLET | ORAL | Status: DC | PRN
  Administered 2020-12-30: 1 via ORAL
  Filled 2020-12-28 (×2): qty 1

## 2020-12-28 MED ORDER — LABETALOL HCL 5 MG/ML IV SOLN: 10.0000 mg | INTRAVENOUS | Status: DC | PRN

## 2020-12-28 MED ORDER — PHYTONADIONE 5 MG PO TABS
5.0000 mg | ORAL_TABLET | Freq: Once | ORAL | Status: AC
  Administered 2020-12-28: 5 mg via ORAL
  Filled 2020-12-28: qty 1

## 2020-12-28 MED ORDER — MORPHINE SULFATE (PF) 4 MG/ML IV SOLN
4.0000 mg | Freq: Once | INTRAVENOUS | Status: AC
  Administered 2020-12-28: 4 mg via INTRAVENOUS
  Filled 2020-12-28: qty 1

## 2020-12-28 MED ORDER — ACETAMINOPHEN 325 MG PO TABS: 650.0000 mg | ORAL_TABLET | Freq: Four times a day (QID) | ORAL | Status: DC | PRN

## 2020-12-28 MED ORDER — ACETAMINOPHEN 650 MG RE SUPP: 650.0000 mg | Freq: Four times a day (QID) | RECTAL | Status: DC | PRN

## 2020-12-28 MED ORDER — VITAMIN K1 10 MG/ML IJ SOLN
2.5000 mg | Freq: Once | INTRAVENOUS | Status: AC
  Administered 2020-12-28: 2.5 mg via INTRAVENOUS
  Filled 2020-12-28: qty 0.25

## 2020-12-28 MED ORDER — NITROFURANTOIN MONOHYD MACRO 100 MG PO CAPS
100.0000 mg | ORAL_CAPSULE | Freq: Two times a day (BID) | ORAL | Status: DC
  Administered 2020-12-28 – 2020-12-30 (×5): 100 mg via ORAL
  Filled 2020-12-28 (×5): qty 1
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
## 2020-12-28 NOTE — ED Notes (Signed)
I was present in the room with Dr. Denton Brick while he explained to the pt the seriousness of her condition and how beneficial it could be for her to receive blood or blood products such as FFP.  He explained to her that she could possible bleed out and die without getting it.  She stated that regardless of the possibility of death that she was a Jehovah Witness and that it was against her religion to receive any blood products.

## 2020-12-28 NOTE — Progress Notes (Signed)
Patient Demographics:    Ana [name], is a 75 y.o. female, DOB - 1946-01-30, [id number]  Admit date - 12/27/2020   Admitting Physician Tarvaris Puglia Denton Brick, MD  Outpatient Primary MD for the patient is Celene Squibb, MD  LOS - 1   No chief complaint on file.       Subjective:    Ana [name] today has no fevers, no emesis,  No chest pain, No shob, no headache, no blood in stool or urine  Assessment  & Plan :    Principal Problem:   Supratherapeutic INR Active Problems:   Blood transfusion declined because patient is Jehovah's Witness   H/o CVA (cerebral vascular accident) ([place])   Essential hypertension   Seizure disorder (HCC)   Thrombocytosis   Leukocytosis   Intrathoracic hematoma   Intramuscular hematoma  Brief Summary:- 75 y.o. female with medical history significant for hypertension, history of CVA, mechanical aortic valve on warfarin, and refusal of blood products admitted with back pain and bruising and found to have elevated INR above 10 in the setting of chronic Coumadin therapy for mechanical valve  A/p 1)Supratherapeutic INR with hematomas--- CT chest abdomen and pelvis shows left posterior chest just lateral to the spinous processes there are 2 large subacute hematomas and Posterior soft tissue hematomas to the left of the midline in the paraspinal soft tissues similar to that noted in the chest. -CT head without ICH -X-rays of the left femur, left tibia-fibula on the left foot without acute findings -INR remains greater than 10 despite 2 doses of oral vitamin K 9 hours apart -Discussed with pharmacy will give IV vitamin K   x 1 -Patient declines FFP as noted below in #3 -Continue to monitor H&H, PT/INR - 2)Acute anemia--- secondary to 1 above, hemoglobin is down to 10.7 from 12.1 admission  3)Social/Ethics--- patient is a full code, -Patient is a Jehovah witness and states  categorically that she would Not accept FFP, PRBC or any other blood products even if she is bleeding out and even if she was at risk of dying -We will respect patient's religious views and honor her wishes  4)Persistent Leukocytosis and thrombocytosis--- patient with longstanding history of polycythemia, outpatient follow-up with hematology as advised  5)Mechanical Valve-- S/p St Jude AVR placed in 1998 -Hold Coumadin due to # 1 above  6)Possible UTI-- c/n Macrobid pending culture  7) hyperglycemia--- no prior history of diabetes, A1c pending  8)HTN--amlodipine 5 mg daily  Disposition/Need for in-Hospital Stay- patient unable to be discharged at this time due to --hematomas and  bruising in the setting of severe super therapeutic INR--requiring close monitoring and IV vitamin K with very increased bleeding risk*  Status is: Inpatient  Remains inpatient appropriate because: Please see disposition above  Disposition: The patient is from: Home              Anticipated d/c is to: Home              Anticipated d/c date is: 2 days              Patient currently is not medically stable to d/c. Barriers: Not Clinically Stable-   Code Status :  -  Code Status: Full Code   Family  Communication:    NA (patient is alert, awake and coherent)   Consults  :  na  DVT Prophylaxis  :   - SCDs       Lab Results  Component Value Date   PLT 932 ([place]) 12/28/2020    Inpatient Medications  Scheduled Meds:  nitrofurantoin (macrocrystal-monohydrate)  100 mg Oral Q12H   pantoprazole  40 mg Oral Daily   Continuous Infusions:  phytonadione (VITAMIN K) IV 2.5 mg (12/28/20 1527)   PRN Meds:.acetaminophen **OR** acetaminophen, HYDROcodone-acetaminophen, labetalol, melatonin, morphine injection, ondansetron **OR** ondansetron (ZOFRAN) IV, senna-docusate    Anti-infectives (From admission, onward)    Start     Dose/Rate Route Frequency Ordered Stop   12/28/20 2100  cefTRIAXone (ROCEPHIN) 1 g  in sodium chloride 0.9 % 100 mL IVPB  Status:  Discontinued        1 g 200 mL/hr over 30 Minutes Intravenous Every 24 hours 12/28/20 0318 12/28/20 0320   12/28/20 0800  nitrofurantoin (macrocrystal-monohydrate) (MACROBID) capsule 100 mg        100 mg Oral Every 12 hours 12/28/20 0322 01/02/21 0959   12/27/20 2100  cefTRIAXone (ROCEPHIN) 1 g in sodium chloride 0.9 % 100 mL IVPB        1 g 200 mL/hr over 30 Minutes Intravenous  Once 12/27/20 2049 12/27/20 2145         Objective:   Vitals:   12/28/20 1100 12/28/20 1130 12/28/20 1200 12/28/20 1300  BP: (!) 129/57 133/67 120/67 (!) 150/71  Pulse: 90 86 84 91  Resp: '15 15 15 18  '$ Temp:    98.3 F (36.8 C)  TempSrc:    Oral  SpO2: 93% 90% 90% 94%  Weight:      Height:        Wt Readings from Last 3 Encounters:  12/27/20 59 kg  04/01/20 59 kg  03/04/20 59 kg     Intake/Output Summary (Last 24 hours) at 12/28/2020 1614 Last data filed at 12/27/2020 2146 Gross per 24 hour  Intake 600 ml  Output --  Net 600 ml     Physical Exam  Gen:- Awake Alert,  in no apparent distress  HEENT:- [place].AT, No sclera icterus Neck-Supple Neck,No JVD,.  Lungs-  CTAB , fair symmetrical air movement CV- S1, S2 normal, regular , metallic valve click Abd-  +ve B.Sounds, Abd Soft, No tenderness,    Extremity/Skin:- No  edema, pedal pulses present, ecchymosis and bruising at different stages of healing, especially on the left lower extremity Psych-affect is appropriate, oriented x3 Neuro-no new focal deficits, no tremors   Data Review:   Micro Results Recent Results (from the past 240 hour(s))  Blood culture (routine x 2)     Status: None (Preliminary result)   Collection Time: 12/27/20  8:58 PM   Specimen: BLOOD  Result Value Ref Range Status   Specimen Description BLOOD LEFT ANTECUBITAL  Final   Special Requests   Final    BOTTLES DRAWN AEROBIC AND ANAEROBIC Blood Culture results may not be optimal due to an excessive volume of blood  received in culture bottles   Culture   Final    NO GROWTH < 12 HOURS Performed at [facility], [address], [place], [place] [address]    Report Status PENDING  Incomplete  Blood culture (routine x 2)     Status: None (Preliminary result)   Collection Time: 12/27/20  8:58 PM   Specimen: BLOOD  Result Value Ref Range Status  Specimen Description BLOOD RIGHT ANTECUBITAL  Final   Special Requests   Final    BOTTLES DRAWN AEROBIC AND ANAEROBIC Blood Culture results may not be optimal due to an excessive volume of blood received in culture bottles   Culture   Final    NO GROWTH < 12 HOURS Performed at [facility], [address] [place], [place] [address]    Report Status PENDING  Incomplete  Resp Panel by RT-PCR (Flu A&B, Covid) Nasopharyngeal Swab     Status: None   Collection Time: 12/27/20  9:10 PM   Specimen: Nasopharyngeal Swab; Nasopharyngeal(NP) swabs in vial transport medium  Result Value Ref Range Status   SARS Coronavirus 2 by RT PCR NEGATIVE NEGATIVE Final    Comment: (NOTE) SARS-CoV-2 target nucleic acids are NOT DETECTED.  The SARS-CoV-2 RNA is generally detectable in upper respiratory specimens during the acute phase of infection. The lowest concentration of SARS-CoV-2 viral copies this assay can detect is 138 copies/mL. A negative result does not preclude SARS-Cov-2 infection and should not be used as the sole basis for treatment or other patient management decisions. A negative result may occur with  improper specimen collection/handling, submission of specimen other than nasopharyngeal swab, presence of viral mutation(s) within the areas targeted by this assay, and inadequate number of viral copies(<138 copies/mL). A negative result must be combined with clinical observations, patient history, and epidemiological information. The expected result is Negative.  Fact Sheet for Patients:  EntrepreneurPulse.com.au  Fact Sheet for Healthcare  Providers:  IncredibleEmployment.be  This test is no t yet approved or cleared by the [place] FDA and  has been authorized for detection and/or diagnosis of SARS-CoV-2 by FDA under an Emergency Use Authorization (EUA). This EUA will remain  in effect (meaning this test can be used) for the duration of the COVID-19 declaration under Section 564(b)(1) of the Act, 21 U.S.C.section 360bbb-3(b)(1), unless the authorization is terminated  or revoked sooner.       Influenza A by PCR NEGATIVE NEGATIVE Final   Influenza B by PCR NEGATIVE NEGATIVE Final    Comment: (NOTE) The Xpert Xpress SARS-CoV-2/FLU/RSV plus assay is intended as an aid in the diagnosis of influenza from Nasopharyngeal swab specimens and should not be used as a sole basis for treatment. Nasal washings and aspirates are unacceptable for Xpert Xpress SARS-CoV-2/FLU/RSV testing.  Fact Sheet for Patients: EntrepreneurPulse.com.au  Fact Sheet for Healthcare Providers: IncredibleEmployment.be  This test is not yet approved or cleared by the [place] FDA and has been authorized for detection and/or diagnosis of SARS-CoV-2 by FDA under an Emergency Use Authorization (EUA). This EUA will remain in effect (meaning this test can be used) for the duration of the COVID-19 declaration under Section 564(b)(1) of the Act, 21 U.S.C. section 360bbb-3(b)(1), unless the authorization is terminated or revoked.  Performed at [facility], [address], [place], [place] [address]     Radiology Reports DG Tibia/Fibula Left  Result Date: 12/27/2020 CLINICAL DATA:  Fall EXAM: LEFT TIBIA AND FIBULA - 2 VIEW COMPARISON:  None. FINDINGS: Mild femorotibial chondrocalcinosis. No acute fracture or dislocation. Joint spaces maintained. IMPRESSION: No acute fracture or dislocation of the left tibia or fibula. Electronically Signed   By: Ulyses Jarred M.D.   On: 12/27/2020 22:25    CT HEAD WO CONTRAST (5MM)  Result Date: 12/27/2020 CLINICAL DATA:  Pt. States they have been having back pain for awhile but can't say for how long. Pt. Has unexplained bruising Head trauma, coagulopathy (  Age 72-65y.). History of intra-ocular prosthesis insertion. EXAM: CT HEAD WITHOUT CONTRAST TECHNIQUE: Contiguous axial images were obtained from the base of the skull through the vertex without intravenous contrast. COMPARISON:  CT head 03/04/2020 FINDINGS: Brain: Cerebral ventricle sizes are concordant with the degree of cerebral volume loss. Patchy and confluent areas of decreased attenuation are noted throughout the deep and periventricular white matter of the cerebral hemispheres bilaterally, compatible with chronic microvascular ischemic disease. Chronic right cerebellar infarction. No evidence of large-territorial acute infarction. No parenchymal hemorrhage. No mass lesion. No extra-axial collection. No mass effect or midline shift. No hydrocephalus. Basilar cisterns are patent. Vascular: No hyperdense vessel. Atherosclerotic calcifications are present within the cavernous internal carotid arteries. Skull: No acute fracture or focal lesion. Sinuses/Orbits: Paranasal sinuses and mastoid air cells are clear. Left orbital surgical changes with interval removal of a left orbital prosthesis. 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 Otherwise the right orbit are unremarkable. Other: None. IMPRESSION: No acute intracranial abnormality. Electronically Signed   By: Iven Finn M.D.   On: 12/27/2020 22:20   CT CHEST ABDOMEN PELVIS W CONTRAST  Result Date: 12/27/2020 CLINICAL DATA:  Generalized bruising without definitive traumatic history, initial encounter EXAM: CT CHEST, ABDOMEN, AND PELVIS WITH CONTRAST TECHNIQUE: Multidetector CT imaging of the chest, abdomen and pelvis was performed following the standard protocol during bolus administration of intravenous contrast. CONTRAST:  3m  OMNIPAQUE IOHEXOL 350 MG/ML SOLN COMPARISON:  04/23/2017 FINDINGS: CT CHEST FINDINGS Cardiovascular: Thoracic aorta demonstrates atherosclerotic calcifications. Mild ectasia of the ascending aorta is noted without true aneurysmal dilatation. Coronary calcifications are seen. No dissection is noted. No cardiac enlargement is seen. The pulmonary artery as visualized is within normal limits. Mediastinum/Nodes: Thoracic inlet is within normal limits. No sizable hilar or mediastinal adenopathy is noted. Small sliding-type hiatal hernia is noted. Lungs/Pleura: Mild dependent atelectatic changes are seen. Small left-sided pleural effusion is noted. No pneumothorax is seen. Musculoskeletal: No acute rib abnormality is noted. No compression deformity is noted. In the left posterior chest wall just lateral to the spinous processes there are multifocal soft tissue hematomas identified with hematocrit levels within. Overlying soft tissue swelling is noted consistent with the given clinical history of bruising. No area of active extravasation is identified to suggest acute hemorrhage. Given the hematocrit level these changes are likely subacute in nature. The largest of these measures 4.9 by 3.0 cm in transverse and AP dimensions. It extends for 11 cm in a craniocaudad projection CT ABDOMEN PELVIS FINDINGS Hepatobiliary: No focal liver abnormality is seen. No gallstones, gallbladder wall thickening, or biliary dilatation. Pancreas: Unremarkable. No pancreatic ductal dilatation or surrounding inflammatory changes. Spleen: Spleen is within normal limits. A heavily calcified splenic artery aneurysm is noted which measures approximately 13 mm stable in appearance from the prior exam. Adrenals/Urinary Tract: Adrenal glands are within normal limits. Kidneys demonstrate a normal enhancement pattern. No renal calculi or obstructive changes are seen. Normal excretion of contrast is noted. The ureters are unremarkable. The bladder is  partially distended. Stomach/Bowel: Scattered diverticular change of the colon is noted without evidence of diverticulitis. The appendix is well visualized and within normal limits. Small bowel and stomach are unremarkable with the exception of the previously mentioned hiatal hernia. Vascular/Lymphatic: Aortic atherosclerosis. No enlarged abdominal or pelvic lymph nodes. Reproductive: Status post hysterectomy. No adnexal masses. Other: No abdominal wall hernia or abnormality. No abdominopelvic ascites. Musculoskeletal: Postsurgical changes in the proximal left femur are seen. Degenerative lumbar spine changes are noted. IMPRESSION: CT of  the chest: No acute rib abnormality is noted. In the left posterior chest just lateral to the spinous processes there are 2 large hematomas identified with hematocrit levels indicating a subacute nature. No active extravasation of hemorrhage is noted. No associated bony abnormality is seen. This likely represents spontaneous hemorrhage from ovary anticoagulation given the elevated prothrombin time and INR. Small left pleural effusion with dependent atelectatic changes. 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 Diverticulosis without diverticulitis. Hiatal hernia. Posterior soft tissue hematomas to the left of the midline in the paraspinal soft tissues similar to that noted in the chest. Critical Value/emergent results were called by telephone at the time of interpretation on 12/27/2020 at 10:22 pm to [facility] , who verbally acknowledged these results. Electronically Signed   By: Inez Catalina M.D.   On: 12/27/2020 22:22   DG Foot Complete Left  Result Date: 12/27/2020 CLINICAL DATA:  Fall EXAM: LEFT FOOT - COMPLETE 3+ VIEW COMPARISON:  None. FINDINGS: There is no evidence of fracture or dislocation. There is no evidence of arthropathy or other focal bone abnormality. Soft tissues are unremarkable. IMPRESSION: Negative.  Electronically Signed   By: Ulyses Jarred M.D.   On: 12/27/2020 22:26   DG Femur Min 2 Views Left  Result Date: 12/27/2020 CLINICAL DATA:  Fall EXAM: LEFT FEMUR 2 VIEWS COMPARISON:  None. FINDINGS: Femoral antegrade intramedullary nail with interlocking component. No fracture or dislocation. No perihardware lucency. IMPRESSION: No fracture or dislocation of the left femur. Electronically Signed   By: Ulyses Jarred M.D.   On: 12/27/2020 22:24     CBC Recent Labs  Lab 12/27/20 1948 12/28/20 0503 12/28/20 1311  WBC 31.3* 25.6* 26.2*  HGB 12.2 10.7* 10.3*  HCT 36.4 32.1* 31.8*  PLT 1,180* 845* 932*  MCV 90.3 88.9 90.6  MCH 30.3 29.6 29.3  MCHC 33.5 33.3 32.4  RDW 14.0 13.8 14.3  LYMPHSABS 0.9  --   --   MONOABS 2.5*  --   --   EOSABS 0.0  --   --   BASOSABS 0.2*  --   --     Chemistries  Recent Labs  Lab 12/27/20 1948 12/28/20 0503  NA 134* 135  K 3.6 4.0  CL 101 101  CO2 24 21*  GLUCOSE 212* 146*  BUN 27* 21  CREATININE 0.93 0.58  CALCIUM 8.8* 8.5*  AST 28  --   ALT 19  --   ALKPHOS 72  --   BILITOT 0.7  --    ------------------------------------------------------------------------------------------------------------------ No results for input(s): CHOL, HDL, LDLCALC, TRIG, CHOLHDL, LDLDIRECT in the last 72 hours.  Lab Results  Component Value Date   HGBA1C 5.6 03/05/2020   ------------------------------------------------------------------------------------------------------------------ No results for input(s): TSH, T4TOTAL, T3FREE, THYROIDAB in the last 72 hours.  Invalid input(s): FREET3 ------------------------------------------------------------------------------------------------------------------ No results for input(s): VITAMINB12, FOLATE, FERRITIN, TIBC, IRON, RETICCTPCT in the last 72 hours.  Coagulation profile Recent Labs  Lab 12/27/20 1948 12/28/20 0503 12/28/20 1311  INR >10.0* >10.0* >10.0*    No results for input(s): DDIMER in the last 72  hours.  Cardiac Enzymes No results for input(s): CKMB, TROPONINI, MYOGLOBIN in the last 168 hours.  Invalid input(s): CK ------------------------------------------------------------------------------------------------------------------ No results found for: BNP   Roxan Hockey M.D on 12/28/2020 at 4:14 PM  Go to [email or web address] - for contact info  [facility] - Office  [phone]

## 2020-12-28 NOTE — Progress Notes (Signed)
Date and time results received: 12/28/20 1414 (use smartphrase ".now" to insert current time)  Test: INR Critical Value: INR >10   Name of Provider Notified: Emokpae, Courage    Orders Received? Or Actions Taken?:  No new orders

## 2020-12-29 DIAGNOSIS — R791 Abnormal coagulation profile: Secondary | ICD-10-CM | POA: Diagnosis not present

## 2020-12-29 LAB — PROTIME-INR
INR: 1.4 — ABNORMAL HIGH (ref 0.8–1.2)
Prothrombin Time: 17 seconds — ABNORMAL HIGH (ref 11.4–15.2)

## 2020-12-29 LAB — BASIC METABOLIC PANEL
Anion gap: 8 (ref 5–15)
BUN: 31 mg/dL — ABNORMAL HIGH (ref 8–23)
CO2: 27 mmol/L (ref 22–32)
Calcium: 8.5 mg/dL — ABNORMAL LOW (ref 8.9–10.3)
Chloride: 99 mmol/L (ref 98–111)
Creatinine, Ser: 0.73 mg/dL (ref 0.44–1.00)
GFR, Estimated: >60 mL/min (ref >60)
Glucose, Bld: 123 mg/dL — ABNORMAL HIGH (ref 70–99)
Potassium: 3.9 mmol/L (ref 3.5–5.1)
Sodium: 134 mmol/L — ABNORMAL LOW (ref 135–145)

## 2020-12-29 LAB — CBC
HCT: 30.2 % — ABNORMAL LOW (ref 36.0–46.0)
Hemoglobin: 9.9 g/dL — ABNORMAL LOW (ref 12.0–15.0)
MCH: 29.9 pg (ref 26.0–34.0)
MCHC: 32.8 g/dL (ref 30.0–36.0)
MCV: 91.2 fL (ref 80.0–100.0)
Platelets: 849 10*3/uL — ABNORMAL HIGH (ref 150–400)
RBC: 3.31 MIL/uL — ABNORMAL LOW (ref 3.87–5.11)
RDW: 14 % (ref 11.5–15.5)
WBC: 21.8 10*3/uL — ABNORMAL HIGH (ref 4.0–10.5)
nRBC: 0 % (ref 0.0–0.2)

## 2020-12-29 LAB — PATHOLOGIST SMEAR REVIEW

## 2020-12-29 MED ORDER — WARFARIN SODIUM 5 MG PO TABS
5.0000 mg | ORAL_TABLET | Freq: Once | ORAL | Status: AC
  Administered 2020-12-29: 5 mg via ORAL
  Filled 2020-12-29: qty 1

## 2020-12-29 MED ORDER — WARFARIN - PHARMACIST DOSING INPATIENT: Freq: Every day | Status: DC

## 2020-12-29 NOTE — Evaluation (Signed)
Occupational Therapy Evaluation Patient Details Name: Ana [name] MRN: [id number] DOB: [birth year] Today's Date: 12/29/2020    History of Present Illness Ana [name] is a 75 y.o. female with medical history significant for hypertension, history of CVA, mechanical aortic valve on warfarin, and refusal of blood products, now presenting to the emergency department for evaluation of back pain and bruising.  Patient reports several days of pain in the mid back, just left of center, and has also noticed bruising involving the left lower leg over the past week with no trauma or inciting event that she could recall.  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  She was having some mild suprapubic discomfort but no other abdominal pain.  She denies melena or hematochezia.  It has been several months since she had her INR checked.  She 7.5 to 8 mg daily when not eating salad and higher dose when she is.   Clinical Impression   Pt agreeable to OT evaluation but reporting 10/10 pain in back and L scapular region during movement. Pt declined to complete sit to stand mobility due to pain. Pt demonstrates B UE weakness with mild limitations in shoulder A/ROM. Pt required min A with extended time to complete supine to sit and sit to supine bed mobility. Pt was able to laterally scoot to head of bed with min G assist. Pt reports she has been bed bound since her stroke, but also reported ambulating in household with RW. Pt left in bed with heating bad on capular region and bed alarm set.  Pt will benefit from continued OT in the hospital and recommended venue below to increase strength, balance, and endurance for safe ADL's.       Follow Up Recommendations  SNF    Equipment Recommendations  None recommended by OT    Recommendations for Other Services       Precautions / Restrictions Precautions Precautions:  Fall Restrictions Weight Bearing Restrictions: No      Mobility Bed Mobility Overal bed mobility: Needs Assistance Bed Mobility: Supine to Sit;Sit to Supine     Supine to sit: Min assist Sit to supine: Min assist   General bed mobility comments: very slow labored movement with report of pain    Transfers                 General transfer comment: Pt requested to not complete sit to stand due to pain. Pt able to scoot to head of bed laterally with min G assist.    Balance Overall balance assessment: Needs assistance Sitting-balance support: No upper extremity supported;Feet supported Sitting balance-Leahy Scale: Fair Sitting balance - Comments: fair to good at EOB                                   ADL either performed or assessed with clinical judgement   ADL Overall ADL's : Needs assistance/impaired                                       General ADL Comments: Pt only completed bed mobility with Min A. Per pt B UE weakness pt likely requires min to max A for lower body dressing and possibly min A for upper body dressing and bathing tasks.  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 Pt reports R eye "does not like light.") Patient Visual Report: No change from baseline                  Pertinent Vitals/Pain Pain Assessment: 0-10 Pain Score: 10-Worst pain ever Pain Location: Low back to L scapular region. Pain Descriptors / Indicators: Other (Comment) ("pain grabs me when I move.") Pain Intervention(s): Limited activity within patient's tolerance;Monitored during session;Repositioned;Heat applied;Other (comment) (Pt discussed pain meds with RN during session.)     Hand Dominance Left   Extremity/Trunk Assessment Upper Extremity Assessment Upper Extremity Assessment: Overall WFL for tasks assessed (B UE A/ROM limited to ~75% of availble range for shoulder flexion supine with HOB elevated. 3/5 MMT grossly for  elbow and grip.)   Lower Extremity Assessment Lower Extremity Assessment: Defer to PT evaluation       Communication Communication Communication: No difficulties   Cognition Arousal/Alertness: Awake/alert Behavior During Therapy: WFL for tasks assessed/performed Overall Cognitive Status: Within Functional Limits for tasks assessed                                 General Comments: Oriented to place and month.   General Comments                  Home Living Family/patient expects to be discharged to:: Private residence Living Arrangements: Children Available Help at Discharge: Family;Available PRN/intermittently Type of Home: Apartment Home Access: Level entry     Home Layout: One level     Bathroom Shower/Tub: Occupational psychologist: Standard     Home Equipment: Environmental consultant - 2 wheels   Additional Comments: Pt reports that her son works at night.      Prior Functioning/Environment Level of Independence: Needs assistance  Gait / Transfers Assistance Needed: Pt reports using RW for household amublation but, also reported she has been bed bound since her recent stroke. Pt reports that her son is present and assisting during transfers when home. ADL's / Homemaking Assistance Needed: Assited by family, but pt reports sponge bathing recnetly and not changing clothes due to weakness.            OT Problem List: Decreased strength;Decreased range of motion;Decreased activity tolerance;Impaired balance (sitting and/or standing);Pain      OT Treatment/Interventions: Self-care/ADL training;Therapeutic exercise;Therapeutic activities;Patient/family education;Balance training    OT Goals(Current goals can be found in the care plan section) Acute Rehab OT Goals Patient Stated Goal: "return home after blood level problem is fixed." OT Goal Formulation: With patient Time For Goal Achievement: 01/12/21 Potential to Achieve Goals: Fair  OT Frequency:  Min 2X/week    End of Session Nurse Communication: Patient requests pain meds;Mobility status  Activity Tolerance: Patient limited by pain Patient left: in bed;with call bell/phone within reach;with bed alarm set  OT Visit Diagnosis: Unsteadiness on feet (R26.81);Muscle weakness (generalized) (M62.81);Pain Pain - Right/Left: Left Pain - part of body:  (scapular region and back)                Time: [id number] OT Time Calculation (min): 22 min Charges:  OT General Charges $OT Visit: 1 Visit OT Evaluation $OT Eval Moderate Complexity: 1 Mod  Uchenna Rappaport OT, MOT   Larey Seat 12/29/2020, 11:39 AM

## 2020-12-29 NOTE — Evaluation (Signed)
Physical Therapy Evaluation Patient Details Name: Ana [name] MRN: [id number] DOB: Sep 06, 1945 Today's Date: 12/29/2020   History of Present Illness  Ana [name] is a 75 y.o. female presents to the emergency department for evaluation of back pain and bruising. Radiographs L leg are negative for acute osseous abnormalities; Head CT negative for acute intracranial abnormality;  CT of chest, abdomen, and pelvis notable for multiple soft tissue hematomas with hematocrit level and no evidence for active hemorrhage. CBC features a leukocytosis 31,300 and thrombocytosis with platelets 1,180,000.  INR is >10. PMH: HTN, history of CVA, mechanical aortic valve on warfarin, refusal of blood products, chronic L eye blindness, seizures   Clinical Impression  Pt admitted with above diagnosis. Pt minimally answering PLOF questions; states she lives with son in apartment, he works nights, she is bedbound except to go to the bathroom, doesn't bathe or change clothes, able to self feed and toilet without assistance. Pt currently refusing all mobility, though also states "I'm not strong" when therapist attempted to educate pt on therapeutic process. Pt mobilizing BLE equally in the bed with AROM WNL; pt doesn't allow MMT. Pt refuses all mobility, requests a blanket, morphine and to rest. Recommending 24 hr assist at d/c, unsure if family able to assist with HHPT or if pt will need SNF- also depends on pt's participation. Provided warm blanket and regular blanket, call button in hand and bed locked in lowest position with alarm on at EOS. Pt currently with functional limitations due to the deficits listed below (see PT Problem List). Pt will benefit from skilled PT to increase their independence and safety with mobility to allow discharge to the venue listed below.       Follow Up Recommendations Supervision/Assistance - 24 hour (SNF vs HHPT with family)    Equipment Recommendations  None recommended by PT     Recommendations for Other Services       Precautions / Restrictions Precautions Precautions: Fall Restrictions Weight Bearing Restrictions: No      Mobility  Bed Mobility General bed mobility comments: pt refuses all mobility despite education on therapeutic process, agreeable to show therapist how she can move BLE in supine    Transfers    Ambulation/Gait     Stairs            Wheelchair Mobility    Modified Rankin (Stroke Patients Only)     Balance           Pertinent Vitals/Pain Pain Assessment: 0-10 Pain Score: 8  Pain Location: shoulder blades down to low back Pain Descriptors / Indicators: Other (Comment) ("grabs me") Pain Intervention(s): Limited activity within patient's tolerance;Monitored during session    [place] expects to be discharged to:: Private residence Living Arrangements: Children Available Help at Discharge: Family;Available PRN/intermittently Type of Home: Apartment Home Access: Level entry     Home Layout: One level Home Equipment: Walker - 2 wheels Additional Comments: Pt reports that her son works at night.    Prior Function Level of Independence: Needs assistance   Gait / Transfers Assistance Needed: Pt reports uses RW for household distances, son providing SUPV for safety. Pt reports being bed bound, only exiting to ambulate to restroom,  since stroke in Sept 2021  ADL's / Homemaking Assistance Needed: Pt reports not bathing or dressing, independent with toileting and self feeding.        Hand Dominance   Dominant Hand: Left    Extremity/Trunk Assessment   Upper Extremity Assessment  Upper Extremity Assessment: Defer to OT evaluation    Lower Extremity Assessment Lower Extremity Assessment: RLE deficits/detail;LLE deficits/detail RLE Deficits / Details: AROM WNL throughout, strength grossly 3/5, denies numbness/tingling, declines MMT RLE Sensation: WNL RLE Coordination: WNL LLE Deficits /  Details: AROM WNL throughout, strength grossly 3/5, denies numbness/tingling, declines MMT LLE Sensation: WNL LLE Coordination: WNL    Cervical / Trunk Assessment Cervical / Trunk Assessment:  (pt refuses to sit up/stand)  Communication   Communication: No difficulties  Cognition Arousal/Alertness: Awake/alert Behavior During Therapy: Flat affect Overall Cognitive Status: No family/caregiver present to determine baseline cognitive functioning  General Comments: Pt answers to name appropriatley, very self directed and difficult to redirect      General Comments      Exercises     Assessment/Plan    PT Assessment Patient needs continued PT services  PT Problem List Decreased strength;Decreased activity tolerance;Decreased balance;Decreased mobility;Decreased cognition       PT Treatment Interventions DME instruction;Gait training;Functional mobility training;Therapeutic activities;Therapeutic exercise;Balance training;Patient/family education;Cognitive remediation    PT Goals (Current goals can be found in the Care Plan section)  Acute Rehab PT Goals Patient Stated Goal: "get me a blanket" PT Goal Formulation: With patient Time For Goal Achievement: 01/12/21 Potential to Achieve Goals: Good    Frequency Min 2X/week   Barriers to discharge        Co-evaluation               AM-PAC PT "6 Clicks" Mobility  Outcome Measure Help needed turning from your back to your side while in a flat bed without using bedrails?: A Lot Help needed moving from lying on your back to sitting on the side of a flat bed without using bedrails?: A Lot Help needed moving to and from a bed to a chair (including a wheelchair)?: A Lot Help needed standing up from a chair using your arms (e.g., wheelchair or bedside chair)?: A Lot Help needed to walk in hospital room?: A Lot Help needed climbing 3-5 steps with a railing? : Total 6 Click Score: 11    End of Session     Patient left: in  bed;with call bell/phone within reach;with bed alarm set Nurse Communication: Mobility status;Other (comment) (pt requesting hot pads) PT Visit Diagnosis: Other abnormalities of gait and mobility (R26.89);Difficulty in walking, not elsewhere classified (R26.2)    Time: [id number] PT Time Calculation (min) (ACUTE ONLY): 14 min   Charges:   PT Evaluation $PT Eval Low Complexity: 1 Low           Tori Britnee Mcdevitt PT, DPT 12/29/20, 11:56 AM

## 2020-12-29 NOTE — Plan of Care (Signed)
  Problem: Acute Rehab OT Goals (only OT should resolve) Goal: Pt. Will Perform Grooming Flowsheets (Taken 12/29/2020 1142) Pt Will Perform Grooming:  sitting  standing  with min guard assist  with supervision  with adaptive equipment Goal: Pt. Will Perform Upper Body Dressing Flowsheets (Taken 12/29/2020 1142) Pt Will Perform Upper Body Dressing:  with modified independence  sitting  bed level Goal: Pt. Will Perform Lower Body Dressing Flowsheets (Taken 12/29/2020 1142) Pt Will Perform Lower Body Dressing:  with min assist  sitting/lateral leans  with adaptive equipment Goal: Pt. Will Transfer To Toilet Flowsheets (Taken 12/29/2020 1142) Pt Will Transfer to Toilet:  with min guard assist  with min assist  stand pivot transfer Goal: Pt/Caregiver Will Perform Home Exercise Program Flowsheets (Taken 12/29/2020 1142) Pt/caregiver will Perform Home Exercise Program:  Increased ROM  Increased strength  Both right and left upper extremity  With Supervision  Shironda Kain OT, MOT
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
## 2020-12-29 NOTE — Progress Notes (Signed)
Patient Demographics:    Ana [name], is a 75 y.o. female, DOB - [birth year], [id number]  Admit date - 12/27/2020   Admitting Physician Courage Denton Brick, MD  Outpatient Primary MD for the patient is Celene Squibb, MD  LOS - 2   No chief complaint on file.       Subjective:    Ana [name] was seen and examined this morning, complaining of upper back pain no signs of hematoma and upper back area.  Denies any shortness of breath, complaining of progressive pain otherwise stable  Coumadin has been reversed with vitamin K, INR 1.4   Discussed with patient and her son on the phone at bedside regarding her supratherapeutic INR, currently being reversed being at risk for stroke.... Benefit of risk of restarting anticoagulation and stroke was discussed in detail... Both patient and son expressed understanding      Assessment  & Plan :    Principal Problem:   Supratherapeutic INR Active Problems:   Essential hypertension   Seizure disorder (HCC)   Thrombocytosis   H/o CVA (cerebral vascular accident) ([place])   Leukocytosis   Intrathoracic hematoma   Intramuscular hematoma   Blood transfusion declined because patient is Jehovah's Witness  Brief Summary:- 75 y.o. female with medical history significant for hypertension, history of CVA, mechanical aortic valve on warfarin, and refusal of blood products admitted with back pain and bruising and found to have elevated INR above 10 in the setting of chronic Coumadin therapy for mechanical valve  A/p 1)Supratherapeutic INR with hematomas--- -Complaining of upper back pain  CT chest abdomen and pelvis shows left posterior chest just lateral to the spinous processes there are 2 large subacute hematomas and Posterior soft tissue hematomas to the left of the midline in the paraspinal soft tissues similar to that noted in the chest. -CT head without ICH -X-rays  of the left femur, left tibia-fibula on the left foot without acute findings -INR remains greater than 10 despite 2 doses of oral vitamin K 9 hours apart -Discussed with pharmacy will give IV vitamin K   x 1 -Patient declines FFP as noted below in #3 -Continue to monitor H&H, PT/INR -INR >10 on admission , now 1.4 >>> - 2)Acute anemia--- secondary to 1 above, hemoglobin is down to 10.7 from 12.1 admission >> 9.9 Monitoring  3)Social/Ethics--- patient is a full code, -Patient is a Jehovah witness and states categorically that she would Not accept FFP, PRBC or any other blood products even if she is bleeding out and even if she was at risk of dying -We will respect patient's religious views and honor her wishes  4)Persistent Leukocytosis and thrombocytosis--- patient with longstanding history of polycythemia, outpatient follow-up with hematology as advised  5)Mechanical Valve-- S/p St Jude AVR placed in 1998 -Hold Coumadin due to # 1 above -INR goal 2-3 -Once patient stable resuming Coumadin at lower dose with a goal of 2 for now -The risk and benefit of anticoagulation was discussed with patient and her son in detail  6)Possible UTI-- c/n Macrobid pending culture  7) hyperglycemia--- no prior history of diabetes, A1c pending  8)HTN--amlodipine 5 mg daily  Disposition/Need for in-Hospital Stay- patient unable to be discharged at this time due to --hematomas and  bruising in the setting of severe super therapeutic INR--requiring close monitoring and IV vitamin K with very increased bleeding risk*  Status is: Inpatient  Remains inpatient appropriate because: Please see disposition above  Disposition: The patient is from: Home              Anticipated d/c is to: Home              Anticipated d/c date is: 2 days              Patient currently is not medically stable to d/c. Barriers: Not Clinically Stable-   Code Status :  -  Code Status: Full Code   Family Communication:    NA  (patient is alert, awake and coherent)   Consults  :  na  DVT Prophylaxis  :   - SCDs    warfarin (COUMADIN) tablet 5 mg    Lab Results  Component Value Date   PLT 849 (H) 12/29/2020    Inpatient Medications  Scheduled Meds:  amLODipine  5 mg Oral Daily   nitrofurantoin (macrocrystal-monohydrate)  100 mg Oral Q12H   pantoprazole  40 mg Oral Daily   warfarin  5 mg Oral ONCE-1600   Warfarin - Pharmacist Dosing Inpatient   Does not apply q1600   Continuous Infusions:   PRN Meds:.acetaminophen **OR** acetaminophen, HYDROcodone-acetaminophen, labetalol, melatonin, morphine injection, ondansetron **OR** ondansetron (ZOFRAN) IV, senna-docusate    Anti-infectives (From admission, onward)    Start     Dose/Rate Route Frequency Ordered Stop   12/28/20 2100  cefTRIAXone (ROCEPHIN) 1 g in sodium chloride 0.9 % 100 mL IVPB  Status:  Discontinued        1 g 200 mL/hr over 30 Minutes Intravenous Every 24 hours 12/28/20 0318 12/28/20 0320   12/28/20 0800  nitrofurantoin (macrocrystal-monohydrate) (MACROBID) capsule 100 mg        100 mg Oral Every 12 hours 12/28/20 0322 01/02/21 0959   12/27/20 2100  cefTRIAXone (ROCEPHIN) 1 g in sodium chloride 0.9 % 100 mL IVPB        1 g 200 mL/hr over 30 Minutes Intravenous  Once 12/27/20 2049 12/27/20 2145         Objective:   Vitals:   12/28/20 1300 12/28/20 1659 12/28/20 2100 12/29/20 0416  BP: (!) 150/71 135/69 (!) 150/81 135/76  Pulse: 91 83 86 88  Resp: '18 18 18 18  '$ Temp: 98.3 F (36.8 C) 98.2 F (36.8 C) 99 F (37.2 C) 98.3 F (36.8 C)  TempSrc: Oral  Oral   SpO2: 94% 93% 93% 93%  Weight:      Height:        Wt Readings from Last 3 Encounters:  12/27/20 59 kg  04/01/20 59 kg  03/04/20 59 kg     Intake/Output Summary (Last 24 hours) at 12/29/2020 1050 Last data filed at 12/29/2020 0848 Gross per 24 hour  Intake 120 ml  Output 100 ml  Net 20 ml      Physical Exam:   General:  Alert, oriented, cooperative, no  distress; complain of upper back pain  HEENT:  Normocephalic, PERRL, otherwise with in Normal limits   Neuro:  CNII-XII intact. , normal motor and sensation, reflexes intact   Lungs:   Clear to auscultation BL, Respirations unlabored, no wheezes / crackles  Cardio:    Metallic artificial aortic valve, clicking, crescendo heart sound, otherwise S1-S2 regular no Rubs or Gallops   Abdomen:   Soft, non-tender, bowel sounds active  all four quadrants,  no guarding or peritoneal signs.  Muscular skeletal:  Severe global generalized weaknesses-Limited exam - in bed, able to move all 4 extremities, Normal strength,  2+ pulses,  symmetric, No pitting edema  Skin:  Dry, warm to touch, negative for any Rashes, Ecchymosis and bruising at different stage of healing especially on left lower extremity  Wounds: Please see nursing documentation         Data Review:   Micro Results Recent Results (from the past 240 hour(s))  Urine Culture     Status: Abnormal (Preliminary result)   Collection Time: 12/27/20  7:40 PM   Specimen: Urine, Clean Catch  Result Value Ref Range Status   Specimen Description   Final    URINE, CLEAN CATCH Performed at [facility], [address], [place], [place] [address]    Special Requests   Final    NONE Performed at [facility], [address], [place], [place] [address]    Culture (A)  Final    >=100,000 COLONIES/mL GRAM NEGATIVE RODS SUSCEPTIBILITIES TO FOLLOW Performed at [facility] [address], [place], [place] [address]    Report Status PENDING  Incomplete  Blood culture (routine x 2)     Status: None (Preliminary result)   Collection Time: 12/27/20  8:58 PM   Specimen: BLOOD  Result Value Ref Range Status   Specimen Description BLOOD LEFT ANTECUBITAL  Final   Special Requests   Final    BOTTLES DRAWN AEROBIC AND ANAEROBIC Blood Culture results may not be optimal due to an excessive volume of blood received in culture bottles   Culture   Final     NO GROWTH 2 DAYS Performed at [facility], [address], [place], [place] [address]    Report Status PENDING  Incomplete  Blood culture (routine x 2)     Status: None (Preliminary result)   Collection Time: 12/27/20  8:58 PM   Specimen: BLOOD  Result Value Ref Range Status   Specimen Description BLOOD RIGHT ANTECUBITAL  Final   Special Requests   Final    BOTTLES DRAWN AEROBIC AND ANAEROBIC Blood Culture results may not be optimal due to an excessive volume of blood received in culture bottles   Culture   Final    NO GROWTH 2 DAYS Performed at [facility], [address], [place], [place] [address]    Report Status PENDING  Incomplete  Resp Panel by RT-PCR (Flu A&B, Covid) Nasopharyngeal Swab     Status: None   Collection Time: 12/27/20  9:10 PM   Specimen: Nasopharyngeal Swab; Nasopharyngeal(NP) swabs in vial transport medium  Result Value Ref Range Status   SARS Coronavirus 2 by RT PCR NEGATIVE NEGATIVE Final    Comment: (NOTE) SARS-CoV-2 target nucleic acids are NOT DETECTED.  The SARS-CoV-2 RNA is generally detectable in upper respiratory specimens during the acute phase of infection. The lowest concentration of SARS-CoV-2 viral copies this assay can detect is 138 copies/mL. A negative result does not preclude SARS-Cov-2 infection and should not be used as the sole basis for treatment or other patient management decisions. A negative result may occur with  improper specimen collection/handling, submission of specimen other than nasopharyngeal swab, presence of viral mutation(s) within the areas targeted by this assay, and inadequate number of viral copies(<138 copies/mL). A negative result must be combined with clinical observations, patient history, and epidemiological information. The expected result is Negative.  Fact Sheet for Patients:  EntrepreneurPulse.com.au  Fact  Sheet for Healthcare Providers:   IncredibleEmployment.be  This test is no t yet approved or cleared by the [place] FDA and  has been authorized for detection and/or diagnosis of SARS-CoV-2 by FDA under an Emergency Use Authorization (EUA). This EUA will remain  in effect (meaning this test can be used) for the duration of the COVID-19 declaration under Section 564(b)(1) of the Act, 21 U.S.C.section 360bbb-3(b)(1), unless the authorization is terminated  or revoked sooner.       Influenza A by PCR NEGATIVE NEGATIVE Final   Influenza B by PCR NEGATIVE NEGATIVE Final    Comment: (NOTE) The Xpert Xpress SARS-CoV-2/FLU/RSV plus assay is intended as an aid in the diagnosis of influenza from Nasopharyngeal swab specimens and should not be used as a sole basis for treatment. Nasal washings and aspirates are unacceptable for Xpert Xpress SARS-CoV-2/FLU/RSV testing.  Fact Sheet for Patients: EntrepreneurPulse.com.au  Fact Sheet for Healthcare Providers: IncredibleEmployment.be  This test is not yet approved or cleared by the [place] FDA and has been authorized for detection and/or diagnosis of SARS-CoV-2 by FDA under an Emergency Use Authorization (EUA). This EUA will remain in effect (meaning this test can be used) for the duration of the COVID-19 declaration under Section 564(b)(1) of the Act, 21 U.S.C. section 360bbb-3(b)(1), unless the authorization is terminated or revoked.  Performed at [facility], [address], [place], [place] [address]     Radiology Reports DG Tibia/Fibula Left  Result Date: 12/27/2020 CLINICAL DATA:  Fall EXAM: LEFT TIBIA AND FIBULA - 2 VIEW COMPARISON:  None. FINDINGS: Mild femorotibial chondrocalcinosis. No acute fracture or dislocation. Joint spaces maintained. IMPRESSION: No acute fracture or dislocation of the left tibia or fibula. Electronically Signed   By: Ulyses Jarred M.D.   On: 12/27/2020 22:25   CT HEAD WO  CONTRAST (5MM)  Result Date: 12/27/2020 CLINICAL DATA:  Pt. States they have been having back pain for awhile but can't say for how long. Pt. Has unexplained bruising Head trauma, coagulopathy (Age 48-64y.). History of intra-ocular prosthesis insertion. EXAM: CT HEAD WITHOUT CONTRAST TECHNIQUE: Contiguous axial images were obtained from the base of the skull through the vertex without intravenous contrast. COMPARISON:  CT head 03/04/2020 FINDINGS: Brain: Cerebral ventricle sizes are concordant with the degree of cerebral volume loss. Patchy and confluent areas of decreased attenuation are noted throughout the deep and periventricular white matter of the cerebral hemispheres bilaterally, compatible with chronic microvascular ischemic disease. Chronic right cerebellar infarction. No evidence of large-territorial acute infarction. No parenchymal hemorrhage. No mass lesion. No extra-axial collection. No mass effect or midline shift. No hydrocephalus. Basilar cisterns are patent. Vascular: No hyperdense vessel. Atherosclerotic calcifications are present within the cavernous internal carotid arteries. Skull: No acute fracture or focal lesion. Sinuses/Orbits: Paranasal sinuses and mastoid air cells are clear. Left orbital surgical changes with interval removal of a left orbital prosthesis. 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 Otherwise the right orbit are unremarkable. Other: None. IMPRESSION: No acute intracranial abnormality. Electronically Signed   By: Iven Finn M.D.   On: 12/27/2020 22:20   CT CHEST ABDOMEN PELVIS W CONTRAST  Result Date: 12/27/2020 CLINICAL DATA:  Generalized bruising without definitive traumatic history, initial encounter EXAM: CT CHEST, ABDOMEN, AND PELVIS WITH CONTRAST TECHNIQUE: Multidetector CT imaging of the chest, abdomen and pelvis was performed following the standard protocol during bolus administration of intravenous contrast. CONTRAST:  62m OMNIPAQUE  IOHEXOL 350 MG/ML SOLN COMPARISON:  04/23/2017 FINDINGS: CT CHEST FINDINGS Cardiovascular: Thoracic aorta  demonstrates atherosclerotic calcifications. Mild ectasia of the ascending aorta is noted without true aneurysmal dilatation. Coronary calcifications are seen. No dissection is noted. No cardiac enlargement is seen. The pulmonary artery as visualized is within normal limits. Mediastinum/Nodes: Thoracic inlet is within normal limits. No sizable hilar or mediastinal adenopathy is noted. Small sliding-type hiatal hernia is noted. Lungs/Pleura: Mild dependent atelectatic changes are seen. Small left-sided pleural effusion is noted. No pneumothorax is seen. Musculoskeletal: No acute rib abnormality is noted. No compression deformity is noted. In the left posterior chest wall just lateral to the spinous processes there are multifocal soft tissue hematomas identified with hematocrit levels within. Overlying soft tissue swelling is noted consistent with the given clinical history of bruising. No area of active extravasation is identified to suggest acute hemorrhage. Given the hematocrit level these changes are likely subacute in nature. The largest of these measures 4.9 by 3.0 cm in transverse and AP dimensions. It extends for 11 cm in a craniocaudad projection CT ABDOMEN PELVIS FINDINGS Hepatobiliary: No focal liver abnormality is seen. No gallstones, gallbladder wall thickening, or biliary dilatation. Pancreas: Unremarkable. No pancreatic ductal dilatation or surrounding inflammatory changes. Spleen: Spleen is within normal limits. A heavily calcified splenic artery aneurysm is noted which measures approximately 13 mm stable in appearance from the prior exam. Adrenals/Urinary Tract: Adrenal glands are within normal limits. Kidneys demonstrate a normal enhancement pattern. No renal calculi or obstructive changes are seen. Normal excretion of contrast is noted. The ureters are unremarkable. The bladder is partially  distended. Stomach/Bowel: Scattered diverticular change of the colon is noted without evidence of diverticulitis. The appendix is well visualized and within normal limits. Small bowel and stomach are unremarkable with the exception of the previously mentioned hiatal hernia. Vascular/Lymphatic: Aortic atherosclerosis. No enlarged abdominal or pelvic lymph nodes. Reproductive: Status post hysterectomy. No adnexal masses. Other: No abdominal wall hernia or abnormality. No abdominopelvic ascites. Musculoskeletal: Postsurgical changes in the proximal left femur are seen. Degenerative lumbar spine changes are noted. IMPRESSION: CT of the chest: No acute rib abnormality is noted. In the left posterior chest just lateral to the spinous processes there are 2 large hematomas identified with hematocrit levels indicating a subacute nature. No active extravasation of hemorrhage is noted. No associated bony abnormality is seen. This likely represents spontaneous hemorrhage from ovary anticoagulation given the elevated prothrombin time and INR. Small left pleural effusion with dependent atelectatic changes. 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 Diverticulosis without diverticulitis. Hiatal hernia. Posterior soft tissue hematomas to the left of the midline in the paraspinal soft tissues similar to that noted in the chest. Critical Value/emergent results were called by telephone at the time of interpretation on 12/27/2020 at 10:22 pm to [facility] , who verbally acknowledged these results. Electronically Signed   By: Inez Catalina M.D.   On: 12/27/2020 22:22   DG Foot Complete Left  Result Date: 12/27/2020 CLINICAL DATA:  Fall EXAM: LEFT FOOT - COMPLETE 3+ VIEW COMPARISON:  None. FINDINGS: There is no evidence of fracture or dislocation. There is no evidence of arthropathy or other focal bone abnormality. Soft tissues are unremarkable. IMPRESSION: Negative.  Electronically Signed   By: Ulyses Jarred M.D.   On: 12/27/2020 22:26   DG Femur Min 2 Views Left  Result Date: 12/27/2020 CLINICAL DATA:  Fall EXAM: LEFT FEMUR 2 VIEWS COMPARISON:  None. FINDINGS: Femoral antegrade intramedullary nail with interlocking component. No fracture or dislocation. No perihardware lucency.  IMPRESSION: No fracture or dislocation of the left femur. Electronically Signed   By: Ulyses Jarred M.D.   On: 12/27/2020 22:24     CBC Recent Labs  Lab 12/27/20 1948 12/28/20 0503 12/28/20 1311 12/28/20 2142 12/29/20 0414  WBC 31.3* 25.6* 26.2*  --  21.8*  HGB 12.2 10.7* 10.3* 9.9* 9.9*  HCT 36.4 32.1* 31.8* 30.3* 30.2*  PLT 1,180* 845* 932*  --  849*  MCV 90.3 88.9 90.6  --  91.2  MCH 30.3 29.6 29.3  --  29.9  MCHC 33.5 33.3 32.4  --  32.8  RDW 14.0 13.8 14.3  --  14.0  LYMPHSABS 0.9  --   --   --   --   MONOABS 2.5*  --   --   --   --   EOSABS 0.0  --   --   --   --   BASOSABS 0.2*  --   --   --   --     Chemistries  Recent Labs  Lab 12/27/20 1948 12/28/20 0503 12/29/20 0414  NA 134* 135 134*  K 3.6 4.0 3.9  CL 101 101 99  CO2 24 21* 27  GLUCOSE 212* 146* 123*  BUN 27* 21 31*  CREATININE 0.93 0.58 0.73  CALCIUM 8.8* 8.5* 8.5*  AST 28  --   --   ALT 19  --   --   ALKPHOS 72  --   --   BILITOT 0.7  --   --    ------------------------------------------------------------------------------------------------------------------ No results for input(s): CHOL, HDL, LDLCALC, TRIG, CHOLHDL, LDLDIRECT in the last 72 hours.  Lab Results  Component Value Date   HGBA1C 5.9 (H) 12/28/2020   ------------------------------------------------------------------------------------------------------------------ No results for input(s): TSH, T4TOTAL, T3FREE, THYROIDAB in the last 72 hours.  Invalid input(s): FREET3 ------------------------------------------------------------------------------------------------------------------ No results for input(s): VITAMINB12,  FOLATE, FERRITIN, TIBC, IRON, RETICCTPCT in the last 72 hours.  Coagulation profile Recent Labs  Lab 12/27/20 1948 12/28/20 0503 12/28/20 1311 12/28/20 2142 12/29/20 0414  INR >10.0* >10.0* >10.0* 1.9* 1.4*    No results for input(s): DDIMER in the last 72 hours.  Cardiac Enzymes No results for input(s): CKMB, TROPONINI, MYOGLOBIN in the last 168 hours.  Invalid input(s): CK ------------------------------------------------------------------------------------------------------------------ No results found for: BNP   Deatra James M.D on 12/29/2020 at 10:50 AM  Go to [email or web address] - for contact info  [facility] - Office  [phone]

## 2020-12-29 NOTE — Progress Notes (Signed)
Pt Given morning medications at 0836 pt calm and situated No complaints at this time. Pt has water and calm bell within reach. Pt educated on how to reach nurse/ nursing staff Via call button or Cell phone.

## 2020-12-29 NOTE — Plan of Care (Signed)
  Problem: Acute Rehab PT Goals(only PT should resolve) Goal: Pt Will Go Supine/Side To Sit Outcome: Progressing Flowsheets (Taken 12/29/2020 1201) Pt will go Supine/Side to Sit: with minimal assist Goal: Pt Will Go Sit To Supine/Side Outcome: Progressing Flowsheets (Taken 12/29/2020 1201) Pt will go Sit to Supine/Side: with minimal assist Goal: Patient Will Transfer Sit To/From Stand Outcome: Progressing Flowsheets (Taken 12/29/2020 1201) Patient will transfer sit to/from stand: with minimal assist Goal: Pt Will Transfer Bed To Chair/Chair To Bed Outcome: Progressing Flowsheets (Taken 12/29/2020 1201) Pt will Transfer Bed to Chair/Chair to Bed: with min assist Goal: Pt Will Ambulate Outcome: Progressing Flowsheets (Taken 12/29/2020 1201) Pt will Ambulate:  15 feet  with minimal assist  with rolling walker   Tori Zoei Amison PT, DPT 12/29/20, 12:02 PM

## 2020-12-29 NOTE — Progress Notes (Signed)
Pt called this nurse at [phone]  via phone Stating she needed pain medication and heating packs. Nurse told pt that nurse would come as soon as possible. This nurse came to the room at 0956 to give pt PRN oral pain medication. Pt stated that the oral medication was not working and she only wanted the "IV pain medication. This nurse retrieved her '2MG'$  morphine and that the pt was then given. Heat packs placed, nurse checked purwick and pt calm and relaxed at this time. Call bell within reach.

## 2020-12-30 DIAGNOSIS — R791 Abnormal coagulation profile: Secondary | ICD-10-CM | POA: Diagnosis not present

## 2020-12-30 LAB — URINALYSIS, ROUTINE W REFLEX MICROSCOPIC
Bilirubin Urine: NEGATIVE
Glucose, UA: NEGATIVE mg/dL
Ketones, ur: 5 mg/dL — AB
Leukocytes,Ua: NEGATIVE
Nitrite: NEGATIVE
Protein, ur: 30 mg/dL — AB
Specific Gravity, Urine: 1.033 — ABNORMAL HIGH (ref 1.005–1.030)
pH: 5 (ref 5.0–8.0)

## 2020-12-30 LAB — URINE CULTURE: Culture: >=100000 — AB

## 2020-12-30 LAB — CBC
HCT: 28.4 % — ABNORMAL LOW (ref 36.0–46.0)
Hemoglobin: 9.2 g/dL — ABNORMAL LOW (ref 12.0–15.0)
MCH: 29.6 pg (ref 26.0–34.0)
MCHC: 32.4 g/dL (ref 30.0–36.0)
MCV: 91.3 fL (ref 80.0–100.0)
Platelets: 730 10*3/uL — ABNORMAL HIGH (ref 150–400)
RBC: 3.11 MIL/uL — ABNORMAL LOW (ref 3.87–5.11)
RDW: 13.7 % (ref 11.5–15.5)
WBC: 15.3 10*3/uL — ABNORMAL HIGH (ref 4.0–10.5)
nRBC: 0 % (ref 0.0–0.2)

## 2020-12-30 LAB — BASIC METABOLIC PANEL
Anion gap: 7 (ref 5–15)
BUN: 33 mg/dL — ABNORMAL HIGH (ref 8–23)
CO2: 29 mmol/L (ref 22–32)
Calcium: 8.4 mg/dL — ABNORMAL LOW (ref 8.9–10.3)
Chloride: 100 mmol/L (ref 98–111)
Creatinine, Ser: 0.76 mg/dL (ref 0.44–1.00)
GFR, Estimated: >60 mL/min (ref >60)
Glucose, Bld: 121 mg/dL — ABNORMAL HIGH (ref 70–99)
Potassium: 3.8 mmol/L (ref 3.5–5.1)
Sodium: 136 mmol/L (ref 135–145)

## 2020-12-30 LAB — PROTIME-INR
INR: 1.2 (ref 0.8–1.2)
Prothrombin Time: 15.6 seconds — ABNORMAL HIGH (ref 11.4–15.2)

## 2020-12-30 MED ORDER — CIPROFLOXACIN HCL 250 MG PO TABS
500.0000 mg | ORAL_TABLET | Freq: Two times a day (BID) | ORAL | Status: DC
  Administered 2020-12-30 – 2021-01-01 (×4): 500 mg via ORAL
  Filled 2020-12-30 (×4): qty 2

## 2020-12-30 MED ORDER — WARFARIN SODIUM 5 MG PO TABS
5.0000 mg | ORAL_TABLET | Freq: Once | ORAL | Status: AC
  Administered 2020-12-30: 5 mg via ORAL
  Filled 2020-12-30: qty 1

## 2020-12-30 MED ORDER — MORPHINE SULFATE (PF) 2 MG/ML IV SOLN
2.0000 mg | INTRAVENOUS | Status: DC | PRN
  Administered 2020-12-30: 2 mg via INTRAVENOUS
  Filled 2020-12-30: qty 1
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
## 2020-12-30 NOTE — Progress Notes (Signed)
Patient Demographics:    Ana [name], is a 75 y.o. female, DOB - 1945/07/10, [id number]  Admit date - 12/27/2020   Admitting Physician Courage Denton Brick, MD  Outpatient Primary MD for the patient is Celene Squibb, MD  LOS - 3   No chief complaint on file.       Subjective:    Ana [name] was seen and examined this morning. Hemodynamically stable,   Discussed yesterday: with patient and her son on the phone at bedside regarding her supratherapeutic INR, currently being reversed being at risk for stroke.... Benefit of risk of restarting anticoagulation and stroke was discussed in detail... Both patient and son expressed understanding Discussed with pharmacy, resuming Coumadin at lower dose     Assessment  & Plan :    Principal Problem:   Supratherapeutic INR Active Problems:   Essential hypertension   Seizure disorder ([facility])   Thrombocytosis   H/o CVA (cerebral vascular accident) ([place])   Leukocytosis   Intrathoracic hematoma   Intramuscular hematoma   Blood transfusion declined because patient is Jehovah's Witness  Brief Summary:- 75 y.o. female with medical history significant for hypertension, history of CVA, mechanical aortic valve on warfarin, and refusal of blood products admitted with back pain and bruising and found to have elevated INR above 10 in the setting of chronic Coumadin therapy for mechanical valve  A/p 1)Supratherapeutic INR with hematomas--- -Pain manageable with narcotics, morphine  CT chest abdomen and pelvis shows left posterior chest just lateral to the spinous processes there are 2 large subacute hematomas and Posterior soft tissue hematomas to the left of the midline in the paraspinal soft tissues similar to that noted in the chest. -CT head without ICH -X-rays of the left femur, left tibia-fibula on the left foot without acute findings -INR remains greater than 10  despite 2 doses of oral vitamin K 9 hours apart -Discussed with pharmacy will give IV vitamin K   x 1 -Patient declines FFP as noted below in #3 -Continue to monitor H&H, PT/INR -INR >10 on admission , now 1.4 >>> 1.2 - 2)Acute anemia--- secondary to 1 above, hemoglobin is down to 10.7 from 12.1 admission >> 9.9, 9.2 Monitoring  3)Social/Ethics--- patient is a full code, -Patient is a Jehovah witness and states categorically that she would Not accept FFP, PRBC or any other blood products even if she is bleeding out and even if she was at risk of dying -We will respect patient's religious views and honor her wishes  4)Persistent Leukocytosis and thrombocytosis--- patient with longstanding history of polycythemia, outpatient follow-up with hematology as advised  5)Mechanical Valve-- S/p St Jude AVR placed in 1998 -Hold Coumadin due to # 1 above -INR goal 2-3 -Once patient stable resuming Coumadin at lower dose with a goal of 2 for now -The risk and benefit of anticoagulation was discussed with patient and her son in detail Discussed with pharmacy, initiating Coumadin and low-dose  6) UTI--urine culture positive for E. coli, pansensitive, previously on Macrobid, currently on Rocephin Today 12/30/2020 Leukocytosis but afebrile WBC 21.8, 15.3 today  7) hyperglycemia--- no prior history of diabetes, A1c: 5.9  8)HTN--amlodipine 5 mg daily  Disposition/Need for in-Hospital Stay- patient unable to be discharged at this time due to --hematomas  and  bruising in the setting of severe super therapeutic INR--requiring close monitoring and IV vitamin K with very increased bleeding risk*  Status is: Inpatient  Remains inpatient appropriate because: Please see disposition above  Disposition: The patient is from: Home              Anticipated d/c is to: Home              Anticipated d/c date is: 2 days              Patient currently is not medically stable to d/c. Barriers: Not Clinically  Stable-   Code Status :  -  Code Status: Full Code   Family Communication:    NA (patient is alert, awake and coherent)   Consults  :  na  DVT Prophylaxis  :   - SCDs    warfarin (COUMADIN) tablet 5 mg    Lab Results  Component Value Date   PLT 730 (H) 12/30/2020    Inpatient Medications  Scheduled Meds:  amLODipine  5 mg Oral Daily   nitrofurantoin (macrocrystal-monohydrate)  100 mg Oral Q12H   pantoprazole  40 mg Oral Daily   warfarin  5 mg Oral ONCE-1600   Warfarin - Pharmacist Dosing Inpatient   Does not apply q1600   Continuous Infusions:   PRN Meds:.acetaminophen **OR** acetaminophen, HYDROcodone-acetaminophen, labetalol, melatonin, morphine injection, ondansetron **OR** ondansetron (ZOFRAN) IV, senna-docusate    Anti-infectives (From admission, onward)    Start     Dose/Rate Route Frequency Ordered Stop   12/28/20 2100  cefTRIAXone (ROCEPHIN) 1 g in sodium chloride 0.9 % 100 mL IVPB  Status:  Discontinued        1 g 200 mL/hr over 30 Minutes Intravenous Every 24 hours 12/28/20 0318 12/28/20 0320   12/28/20 0800  nitrofurantoin (macrocrystal-monohydrate) (MACROBID) capsule 100 mg        100 mg Oral Every 12 hours 12/28/20 0322 01/02/21 0959   12/27/20 2100  cefTRIAXone (ROCEPHIN) 1 g in sodium chloride 0.9 % 100 mL IVPB        1 g 200 mL/hr over 30 Minutes Intravenous  Once 12/27/20 2049 12/27/20 2145         Objective:   Vitals:   12/29/20 1214 12/29/20 1600 12/29/20 2135 12/30/20 0457  BP: (!) 128/58  118/62 126/63  Pulse: 86  86   Resp: 17   16  Temp:   98.4 F (36.9 C) 98.7 F (37.1 C)  TempSrc:   Oral Oral  SpO2: 95%  95% 96%  Weight:  101 kg    Height:  '5\' 6"'$  (1.676 m)      Wt Readings from Last 3 Encounters:  12/29/20 101 kg  04/01/20 59 kg  03/04/20 59 kg     Intake/Output Summary (Last 24 hours) at 12/30/2020 1444 Last data filed at 12/30/2020 1307 Gross per 24 hour  Intake --  Output 500 ml  Net -500 ml       Physical  Exam:   General:  Alert, oriented, cooperative, no distress;   HEENT:  Normocephalic, PERRL, otherwise with in Normal limits   Neuro:  CNII-XII intact. , normal motor and sensation, reflexes intact   Lungs:   Clear to auscultation BL, Respirations unlabored, no wheezes / crackles  Cardio:    S1/S2, RRR, No murmure, No Rubs or Gallops   Abdomen:   Soft, non-tender, bowel sounds active all four quadrants,  no guarding or peritoneal signs.  Muscular skeletal:  Generalized severe global weakness -thigh hematoma Limited exam - in bed, able to move all 4 extremities, Normal strength,  2+ pulses,  symmetric, No pitting edema  Skin:  Dry, warm to touch, negative for any Rashes,  Wounds: Please see nursing documentation            Data Review:   Micro Results Recent Results (from the past 240 hour(s))  Urine Culture     Status: Abnormal   Collection Time: 12/27/20  7:40 PM   Specimen: Urine, Clean Catch  Result Value Ref Range Status   Specimen Description   Final    URINE, CLEAN CATCH Performed at [facility], [address] [place], [place] [address]    Special Requests   Final    NONE Performed at [facility], [address] [place], [place] [address]    Culture >=100,000 COLONIES/mL ESCHERICHIA COLI (A)  Final   Report Status 12/30/2020 FINAL  Final   Organism ID, Bacteria ESCHERICHIA COLI (A)  Final      Susceptibility   Escherichia coli - MIC*    AMPICILLIN <=2 SENSITIVE Sensitive     CEFAZOLIN <=4 SENSITIVE Sensitive     CEFEPIME <=0.12 SENSITIVE Sensitive     CEFTRIAXONE <=0.25 SENSITIVE Sensitive     CIPROFLOXACIN <=0.25 SENSITIVE Sensitive     GENTAMICIN <=1 SENSITIVE Sensitive     IMIPENEM <=0.25 SENSITIVE Sensitive     NITROFURANTOIN <=16 SENSITIVE Sensitive     TRIMETH/SULFA <=20 SENSITIVE Sensitive     AMPICILLIN/SULBACTAM <=2 SENSITIVE Sensitive     PIP/TAZO <=4 SENSITIVE Sensitive     * >=100,000 COLONIES/mL ESCHERICHIA COLI  Blood culture (routine x 2)      Status: None (Preliminary result)   Collection Time: 12/27/20  8:58 PM   Specimen: BLOOD  Result Value Ref Range Status   Specimen Description BLOOD LEFT ANTECUBITAL  Final   Special Requests   Final    BOTTLES DRAWN AEROBIC AND ANAEROBIC Blood Culture results may not be optimal due to an excessive volume of blood received in culture bottles   Culture   Final    NO GROWTH 3 DAYS Performed at [facility], [address], [place], [place] [address]    Report Status PENDING  Incomplete  Blood culture (routine x 2)     Status: None (Preliminary result)   Collection Time: 12/27/20  8:58 PM   Specimen: BLOOD  Result Value Ref Range Status   Specimen Description BLOOD RIGHT ANTECUBITAL  Final   Special Requests   Final    BOTTLES DRAWN AEROBIC AND ANAEROBIC Blood Culture results may not be optimal due to an excessive volume of blood received in culture bottles   Culture   Final    NO GROWTH 3 DAYS Performed at [facility], [address], [place], [place] [address]    Report Status PENDING  Incomplete  Resp Panel by RT-PCR (Flu A&B, Covid) Nasopharyngeal Swab     Status: None   Collection Time: 12/27/20  9:10 PM   Specimen: Nasopharyngeal Swab; Nasopharyngeal(NP) swabs in vial transport medium  Result Value Ref Range Status   SARS Coronavirus 2 by RT PCR NEGATIVE NEGATIVE Final    Comment: (NOTE) SARS-CoV-2 target nucleic acids are NOT DETECTED.  The SARS-CoV-2 RNA is generally detectable in upper respiratory specimens during the acute phase of infection. The lowest concentration of SARS-CoV-2 viral copies this assay can detect is 138 copies/mL. A negative result does not preclude SARS-Cov-2 infection and should not  be used as the sole basis for treatment or other patient management decisions. A negative result may occur with  improper specimen collection/handling, submission of specimen other than nasopharyngeal swab, presence of viral mutation(s) within the areas targeted by this  assay, and inadequate number of viral copies(<138 copies/mL). A negative result must be combined with clinical observations, patient history, and epidemiological information. The expected result is Negative.  Fact Sheet for Patients:  EntrepreneurPulse.com.au  Fact Sheet for Healthcare Providers:  IncredibleEmployment.be  This test is no t yet approved or cleared by the [place] FDA and  has been authorized for detection and/or diagnosis of SARS-CoV-2 by FDA under an Emergency Use Authorization (EUA). This EUA will remain  in effect (meaning this test can be used) for the duration of the COVID-19 declaration under Section 564(b)(1) of the Act, 21 U.S.C.section 360bbb-3(b)(1), unless the authorization is terminated  or revoked sooner.       Influenza A by PCR NEGATIVE NEGATIVE Final   Influenza B by PCR NEGATIVE NEGATIVE Final    Comment: (NOTE) The Xpert Xpress SARS-CoV-2/FLU/RSV plus assay is intended as an aid in the diagnosis of influenza from Nasopharyngeal swab specimens and should not be used as a sole basis for treatment. Nasal washings and aspirates are unacceptable for Xpert Xpress SARS-CoV-2/FLU/RSV testing.  Fact Sheet for Patients: EntrepreneurPulse.com.au  Fact Sheet for Healthcare Providers: IncredibleEmployment.be  This test is not yet approved or cleared by the [place] FDA and has been authorized for detection and/or diagnosis of SARS-CoV-2 by FDA under an Emergency Use Authorization (EUA). This EUA will remain in effect (meaning this test can be used) for the duration of the COVID-19 declaration under Section 564(b)(1) of the Act, 21 U.S.C. section 360bbb-3(b)(1), unless the authorization is terminated or revoked.  Performed at [facility], [address] [place], [place] [address]     Radiology Reports DG Tibia/Fibula Left  Result Date: 12/27/2020 CLINICAL DATA:  Fall  EXAM: LEFT TIBIA AND FIBULA - 2 VIEW COMPARISON:  None. FINDINGS: Mild femorotibial chondrocalcinosis. No acute fracture or dislocation. Joint spaces maintained. IMPRESSION: No acute fracture or dislocation of the left tibia or fibula. Electronically Signed   By: Ulyses Jarred M.D.   On: 12/27/2020 22:25   CT HEAD WO CONTRAST (5MM)  Result Date: 12/27/2020 CLINICAL DATA:  Pt. States they have been having back pain for awhile but can't say for how long. Pt. Has unexplained bruising Head trauma, coagulopathy (Age 32-64y.). History of intra-ocular prosthesis insertion. EXAM: CT HEAD WITHOUT CONTRAST TECHNIQUE: Contiguous axial images were obtained from the base of the skull through the vertex without intravenous contrast. COMPARISON:  CT head 03/04/2020 FINDINGS: Brain: Cerebral ventricle sizes are concordant with the degree of cerebral volume loss. Patchy and confluent areas of decreased attenuation are noted throughout the deep and periventricular white matter of the cerebral hemispheres bilaterally, compatible with chronic microvascular ischemic disease. Chronic right cerebellar infarction. No evidence of large-territorial acute infarction. No parenchymal hemorrhage. No mass lesion. No extra-axial collection. No mass effect or midline shift. No hydrocephalus. Basilar cisterns are patent. Vascular: No hyperdense vessel. Atherosclerotic calcifications are present within the cavernous internal carotid arteries. Skull: No acute fracture or focal lesion. Sinuses/Orbits: Paranasal sinuses and mastoid air cells are clear. Left orbital surgical changes with interval removal of a left orbital prosthesis. 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 Otherwise the right orbit are unremarkable. Other: None. IMPRESSION: No acute intracranial abnormality. Electronically Signed   By: Clelia Croft.D.  On: 12/27/2020 22:20   CT CHEST ABDOMEN PELVIS W CONTRAST  Result Date: 12/27/2020 CLINICAL DATA:   Generalized bruising without definitive traumatic history, initial encounter EXAM: CT CHEST, ABDOMEN, AND PELVIS WITH CONTRAST TECHNIQUE: Multidetector CT imaging of the chest, abdomen and pelvis was performed following the standard protocol during bolus administration of intravenous contrast. CONTRAST:  61m OMNIPAQUE IOHEXOL 350 MG/ML SOLN COMPARISON:  04/23/2017 FINDINGS: CT CHEST FINDINGS Cardiovascular: Thoracic aorta demonstrates atherosclerotic calcifications. Mild ectasia of the ascending aorta is noted without true aneurysmal dilatation. Coronary calcifications are seen. No dissection is noted. No cardiac enlargement is seen. The pulmonary artery as visualized is within normal limits. Mediastinum/Nodes: Thoracic inlet is within normal limits. No sizable hilar or mediastinal adenopathy is noted. Small sliding-type hiatal hernia is noted. Lungs/Pleura: Mild dependent atelectatic changes are seen. Small left-sided pleural effusion is noted. No pneumothorax is seen. Musculoskeletal: No acute rib abnormality is noted. No compression deformity is noted. In the left posterior chest wall just lateral to the spinous processes there are multifocal soft tissue hematomas identified with hematocrit levels within. Overlying soft tissue swelling is noted consistent with the given clinical history of bruising. No area of active extravasation is identified to suggest acute hemorrhage. Given the hematocrit level these changes are likely subacute in nature. The largest of these measures 4.9 by 3.0 cm in transverse and AP dimensions. It extends for 11 cm in a craniocaudad projection CT ABDOMEN PELVIS FINDINGS Hepatobiliary: No focal liver abnormality is seen. No gallstones, gallbladder wall thickening, or biliary dilatation. Pancreas: Unremarkable. No pancreatic ductal dilatation or surrounding inflammatory changes. Spleen: Spleen is within normal limits. A heavily calcified splenic artery aneurysm is noted which measures  approximately 13 mm stable in appearance from the prior exam. Adrenals/Urinary Tract: Adrenal glands are within normal limits. Kidneys demonstrate a normal enhancement pattern. No renal calculi or obstructive changes are seen. Normal excretion of contrast is noted. The ureters are unremarkable. The bladder is partially distended. Stomach/Bowel: Scattered diverticular change of the colon is noted without evidence of diverticulitis. The appendix is well visualized and within normal limits. Small bowel and stomach are unremarkable with the exception of the previously mentioned hiatal hernia. Vascular/Lymphatic: Aortic atherosclerosis. No enlarged abdominal or pelvic lymph nodes. Reproductive: Status post hysterectomy. No adnexal masses. Other: No abdominal wall hernia or abnormality. No abdominopelvic ascites. Musculoskeletal: Postsurgical changes in the proximal left femur are seen. Degenerative lumbar spine changes are noted. IMPRESSION: CT of the chest: No acute rib abnormality is noted. In the left posterior chest just lateral to the spinous processes there are 2 large hematomas identified with hematocrit levels indicating a subacute nature. No active extravasation of hemorrhage is noted. No associated bony abnormality is seen. This likely represents spontaneous hemorrhage from ovary anticoagulation given the elevated prothrombin time and INR. Small left pleural effusion with dependent atelectatic changes. 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 Diverticulosis without diverticulitis. Hiatal hernia. Posterior soft tissue hematomas to the left of the midline in the paraspinal soft tissues similar to that noted in the chest. Critical Value/emergent results were called by telephone at the time of interpretation on 12/27/2020 at 10:22 pm to [facility] , who verbally acknowledged these results. Electronically Signed   By: MInez CatalinaM.D.   On: 12/27/2020 22:22    DG Foot Complete Left  Result Date: 12/27/2020 CLINICAL DATA:  Fall EXAM: LEFT FOOT - COMPLETE 3+ VIEW COMPARISON:  None. FINDINGS: There is no  evidence of fracture or dislocation. There is no evidence of arthropathy or other focal bone abnormality. Soft tissues are unremarkable. IMPRESSION: Negative. Electronically Signed   By: Ulyses Jarred M.D.   On: 12/27/2020 22:26   DG Femur Min 2 Views Left  Result Date: 12/27/2020 CLINICAL DATA:  Fall EXAM: LEFT FEMUR 2 VIEWS COMPARISON:  None. FINDINGS: Femoral antegrade intramedullary nail with interlocking component. No fracture or dislocation. No perihardware lucency. IMPRESSION: No fracture or dislocation of the left femur. Electronically Signed   By: Ulyses Jarred M.D.   On: 12/27/2020 22:24     CBC Recent Labs  Lab 12/27/20 1948 12/28/20 0503 12/28/20 1311 12/28/20 2142 12/29/20 0414 12/30/20 0601  WBC 31.3* 25.6* 26.2*  --  21.8* 15.3*  HGB 12.2 10.7* 10.3* 9.9* 9.9* 9.2*  HCT 36.4 32.1* 31.8* 30.3* 30.2* 28.4*  PLT 1,180* 845* 932*  --  849* 730*  MCV 90.3 88.9 90.6  --  91.2 91.3  MCH 30.3 29.6 29.3  --  29.9 29.6  MCHC 33.5 33.3 32.4  --  32.8 32.4  RDW 14.0 13.8 14.3  --  14.0 13.7  LYMPHSABS 0.9  --   --   --   --   --   MONOABS 2.5*  --   --   --   --   --   EOSABS 0.0  --   --   --   --   --   BASOSABS 0.2*  --   --   --   --   --     Chemistries  Recent Labs  Lab 12/27/20 1948 12/28/20 0503 12/29/20 0414 12/30/20 0601  NA 134* 135 134* 136  K 3.6 4.0 3.9 3.8  CL 101 101 99 100  CO2 24 21* 27 29  GLUCOSE 212* 146* 123* 121*  BUN 27* 21 31* 33*  CREATININE 0.93 0.58 0.73 0.76  CALCIUM 8.8* 8.5* 8.5* 8.4*  AST 28  --   --   --   ALT 19  --   --   --   ALKPHOS 72  --   --   --   BILITOT 0.7  --   --   --    ------------------------------------------------------------------------------------------------------------------ No results for input(s): CHOL, HDL, LDLCALC, TRIG, CHOLHDL, LDLDIRECT in the last 72  hours.  Lab Results  Component Value Date   HGBA1C 5.9 (H) 12/28/2020   ------------------------------------------------------------------------------------------------------------------ No results for input(s): TSH, T4TOTAL, T3FREE, THYROIDAB in the last 72 hours.  Invalid input(s): FREET3 ------------------------------------------------------------------------------------------------------------------ No results for input(s): VITAMINB12, FOLATE, FERRITIN, TIBC, IRON, RETICCTPCT in the last 72 hours.  Coagulation profile Recent Labs  Lab 12/28/20 0503 12/28/20 1311 12/28/20 2142 12/29/20 0414 12/30/20 0601  INR >10.0* >10.0* 1.9* 1.4* 1.2    No results for input(s): DDIMER in the last 72 hours.  Cardiac Enzymes No results for input(s): CKMB, TROPONINI, MYOGLOBIN in the last 168 hours.  Invalid input(s): CK ------------------------------------------------------------------------------------------------------------------ No results found for: BNP   Deatra James M.D on 12/30/2020 at 2:44 PM  Go to [email or web address] - for contact info  [facility] - Office  [phone]

## 2020-12-31 DIAGNOSIS — R791 Abnormal coagulation profile: Secondary | ICD-10-CM | POA: Diagnosis not present

## 2020-12-31 LAB — CBC
HCT: 28.8 % — ABNORMAL LOW (ref 36.0–46.0)
Hemoglobin: 9.4 g/dL — ABNORMAL LOW (ref 12.0–15.0)
MCH: 29.5 pg (ref 26.0–34.0)
MCHC: 32.6 g/dL (ref 30.0–36.0)
MCV: 90.3 fL (ref 80.0–100.0)
Platelets: 713 10*3/uL — ABNORMAL HIGH (ref 150–400)
RBC: 3.19 MIL/uL — ABNORMAL LOW (ref 3.87–5.11)
RDW: 13.4 % (ref 11.5–15.5)
WBC: 18.7 10*3/uL — ABNORMAL HIGH (ref 4.0–10.5)
nRBC: 0 % (ref 0.0–0.2)

## 2020-12-31 LAB — PROTIME-INR
INR: 1.5 — ABNORMAL HIGH (ref 0.8–1.2)
Prothrombin Time: 18.4 seconds — ABNORMAL HIGH (ref 11.4–15.2)

## 2020-12-31 MED ORDER — AMLODIPINE BESYLATE 5 MG PO TABS
2.5000 mg | ORAL_TABLET | Freq: Every day | ORAL | Status: DC
  Administered 2020-12-31 – 2021-01-04 (×5): 2.5 mg via ORAL
  Filled 2020-12-31 (×6): qty 1

## 2020-12-31 MED ORDER — MORPHINE SULFATE (PF) 2 MG/ML IV SOLN: 2.0000 mg | INTRAVENOUS | Status: DC | PRN

## 2020-12-31 MED ORDER — WARFARIN SODIUM 5 MG PO TABS
5.0000 mg | ORAL_TABLET | Freq: Once | ORAL | Status: AC
  Administered 2020-12-31: 5 mg via ORAL
  Filled 2020-12-31: qty 1

## 2020-12-31 NOTE — Progress Notes (Signed)
Patient Demographics:    Ana [name], is a 75 y.o. female, DOB - [birth year], [id number]  Admit date - 12/27/2020   Admitting Physician Courage Denton Brick, MD  Outpatient Primary MD for the patient is Celene Squibb, MD  LOS - 4   No chief complaint on file.       Subjective:    Ana [name] was seen and examined this morning, somnolent, complaining of generalized aches and pain, generalized weaknesses... Stating she would like to sleep, feeling very tired per nursing staff, getting scheduled morphine Hemodynamically stable   Assessment  & Plan :    Principal Problem:   Supratherapeutic INR Active Problems:   Essential hypertension   Seizure disorder (HCC)   Thrombocytosis   H/o CVA (cerebral vascular accident) ([place])   Leukocytosis   Intrathoracic hematoma   Intramuscular hematoma   Blood transfusion declined because patient is Jehovah's Witness  Brief Summary:- 75 y.o. female with medical history significant for hypertension, history of CVA, mechanical aortic valve on warfarin, and refusal of blood products admitted with back pain and bruising and found to have elevated INR above 10 in the setting of chronic Coumadin therapy for mechanical valve  ----------------------------------------------------------------------------------------------------------------------------------------------  A/p 1)Supratherapeutic INR with hematomas--- -Pain management with pain medication  -Very comfortable, but excessively somnolent sleepy with pain meds  CT chest abdomen and pelvis shows left posterior chest just lateral to the spinous processes there are 2 large subacute hematomas and Posterior soft tissue hematomas to the left of the midline in the paraspinal soft tissues similar to that noted in the chest. -CT head without ICH -X-rays of the left femur, left tibia-fibula on the left foot without acute  findings -INR remains greater than 10 despite 2 doses of oral vitamin K 9 hours apart -Discussed with pharmacy will give IV vitamin K   x 1 -Patient declines FFP as noted below in #3 -Continue to monitor H&H, PT/INR -INR >10 on admission , now 1.4 >>> 1.2  2)Acute anemia - secondary to 1 above, hemoglobin is down to 10.7 from 12.1 admission >> 9.9, 9.2>> 9.4 Monitoring  3)Social/Ethics--- patient is a full code, -Patient is a Jehovah witness and states categorically that she would Not accept FFP, PRBC or any other blood products even if she is bleeding out and even if she was at risk of dying -We will respect patient's religious views and honor her wishes  4)Persistent Leukocytosis and thrombocytosis- -- patient with longstanding history of polycythemia, outpatient follow-up with hematology as advised  5)Mechanical Valve- - S/p [employer] AVR placed in 1998 -Hold Coumadin due to # 1 above -INR goal 2-3 -Resuming Coumadin, with INR close to 2 -The risk and benefit of anticoagulation was discussed with patient and her son in detail Discussed with pharmacy, initiating Coumadin and low-dose  6) UTI--urine culture positive for E. coli, pansensitive, previously on Macrobid,  Continue p.o. antibiotics of ciprofloxacin Leukocytosis but afebrile WBC 21.8, 15.3 >> 18.7 today  7) hyperglycemia--- no prior history of diabetes, A1c: 5.9  8)HTN--amlodipine 5 mg daily  9) severe debility-due to previous strokes, 1 eye blindness -Per patient's son post previous stroke patient is now basically chair bound, bedbound Has trouble with ambulating, needs assist with most ADLs -Family providing as  much as Possible at home -We will consult PT OT, await recommendation, likely would need extensive home health arrangement    Disposition/Need for in-Hospital Stay- patient unable to be discharged at this time due to --hematomas and  bruising in the setting of severe super therapeutic INR--requiring close  monitoring and IV vitamin K with very increased bleeding risk*  Status is: Inpatient  Remains inpatient appropriate because: Please see disposition above  Disposition: The patient is from: Home              Anticipated d/c is to: Home              Anticipated d/c date is: 1-2 days, once INR is close to 2, likely home with home health              Patient currently is not medically stable to d/c. Barriers: Not Clinically Stable-   Code Status :  -  Code Status: Full Code   Family Communication:    NA (patient is alert, awake and coherent)   Consults  :  na  DVT Prophylaxis  :   - SCDs    warfarin (COUMADIN) tablet 5 mg    Lab Results  Component Value Date   PLT 713 (H) 12/31/2020    Inpatient Medications  Scheduled Meds:  amLODipine  2.5 mg Oral Daily   ciprofloxacin  500 mg Oral BID   pantoprazole  40 mg Oral Daily   warfarin  5 mg Oral ONCE-1600   Warfarin - Pharmacist Dosing Inpatient   Does not apply q1600   Continuous Infusions:   PRN Meds:.acetaminophen **OR** acetaminophen, HYDROcodone-acetaminophen, labetalol, melatonin, morphine injection, ondansetron **OR** ondansetron (ZOFRAN) IV, senna-docusate    Anti-infectives (From admission, onward)    Start     Dose/Rate Route Frequency Ordered Stop   12/30/20 2000  ciprofloxacin (CIPRO) tablet 500 mg        500 mg Oral 2 times daily 12/30/20 1451     12/28/20 2100  cefTRIAXone (ROCEPHIN) 1 g in sodium chloride 0.9 % 100 mL IVPB  Status:  Discontinued        1 g 200 mL/hr over 30 Minutes Intravenous Every 24 hours 12/28/20 0318 12/28/20 0320   12/28/20 0800  nitrofurantoin (macrocrystal-monohydrate) (MACROBID) capsule 100 mg  Status:  Discontinued        100 mg Oral Every 12 hours 12/28/20 0322 12/30/20 1451   12/27/20 2100  cefTRIAXone (ROCEPHIN) 1 g in sodium chloride 0.9 % 100 mL IVPB        1 g 200 mL/hr over 30 Minutes Intravenous  Once 12/27/20 2049 12/27/20 2145         Objective:   Vitals:    12/30/20 0457 12/30/20 1740 12/30/20 2241 12/31/20 0700  BP: 126/63 129/64 (!) 122/54 104/67  Pulse:  81 87 88  Resp: '16 18 16 16  '$ Temp: 98.7 F (37.1 C) 98.5 F (36.9 C) 98 F (36.7 C) 98.1 F (36.7 C)  TempSrc: Oral Oral Oral Oral  SpO2: 96% 94% 98% 97%  Weight:      Height:        Wt Readings from Last 3 Encounters:  12/29/20 101 kg  04/01/20 59 kg  03/04/20 59 kg     Intake/Output Summary (Last 24 hours) at 12/31/2020 1246 Last data filed at 12/30/2020 1951 Gross per 24 hour  Intake 120 ml  Output 700 ml  Net -580 ml       Physical Exam:  General:  Alert, oriented, cooperative, no distress; somnolent  HEENT:  1 eye blindness, normocephalic, PERRL, otherwise with in Normal limits   Neuro:  CNII-XII intact. , normal motor and sensation, reflexes intact   Lungs:   Clear to auscultation BL, Respirations unlabored, no wheezes / crackles  Cardio:    S1/S2, RRR, No murmure, No Rubs or Gallops   Abdomen:   Soft, non-tender, bowel sounds active all four quadrants,  no guarding or peritoneal signs.  Muscular skeletal:  Severe debility, moving all 4 extremities in bed, unable to ambulate without assist  No pitting edema  Skin:  Dry, warm to touch, negative for any Rashes,  Wounds: Please see nursing documentation              Data Review:   Micro Results Recent Results (from the past 240 hour(s))  Urine Culture     Status: Abnormal   Collection Time: 12/27/20  7:40 PM   Specimen: Urine, Clean Catch  Result Value Ref Range Status   Specimen Description   Final    URINE, CLEAN CATCH Performed at [facility], [address] [place], [place] [address]    Special Requests   Final    NONE Performed at [facility], [address], [place], [place] [address]    Culture >=100,000 COLONIES/mL ESCHERICHIA COLI (A)  Final   Report Status 12/30/2020 FINAL  Final   Organism ID, Bacteria ESCHERICHIA COLI (A)  Final      Susceptibility   Escherichia coli - MIC*     AMPICILLIN <=2 SENSITIVE Sensitive     CEFAZOLIN <=4 SENSITIVE Sensitive     CEFEPIME <=0.12 SENSITIVE Sensitive     CEFTRIAXONE <=0.25 SENSITIVE Sensitive     CIPROFLOXACIN <=0.25 SENSITIVE Sensitive     GENTAMICIN <=1 SENSITIVE Sensitive     IMIPENEM <=0.25 SENSITIVE Sensitive     NITROFURANTOIN <=16 SENSITIVE Sensitive     TRIMETH/SULFA <=20 SENSITIVE Sensitive     AMPICILLIN/SULBACTAM <=2 SENSITIVE Sensitive     PIP/TAZO <=4 SENSITIVE Sensitive     * >=100,000 COLONIES/mL ESCHERICHIA COLI  Blood culture (routine x 2)     Status: None (Preliminary result)   Collection Time: 12/27/20  8:58 PM   Specimen: BLOOD  Result Value Ref Range Status   Specimen Description BLOOD LEFT ANTECUBITAL  Final   Special Requests   Final    BOTTLES DRAWN AEROBIC AND ANAEROBIC Blood Culture results may not be optimal due to an excessive volume of blood received in culture bottles   Culture   Final    NO GROWTH 4 DAYS Performed at [facility], [address] [place], [place] [address]    Report Status PENDING  Incomplete  Blood culture (routine x 2)     Status: None (Preliminary result)   Collection Time: 12/27/20  8:58 PM   Specimen: BLOOD  Result Value Ref Range Status   Specimen Description BLOOD RIGHT ANTECUBITAL  Final   Special Requests   Final    BOTTLES DRAWN AEROBIC AND ANAEROBIC Blood Culture results may not be optimal due to an excessive volume of blood received in culture bottles   Culture   Final    NO GROWTH 4 DAYS Performed at [facility], [address] [place], [place] [address]    Report Status PENDING  Incomplete  Resp Panel by RT-PCR (Flu A&B, Covid) Nasopharyngeal Swab     Status: None   Collection Time: 12/27/20  9:10 PM   Specimen: Nasopharyngeal Swab;  Nasopharyngeal(NP) swabs in vial transport medium  Result Value Ref Range Status   SARS Coronavirus 2 by RT PCR NEGATIVE NEGATIVE Final    Comment: (NOTE) SARS-CoV-2 target nucleic acids are NOT DETECTED.  The  SARS-CoV-2 RNA is generally detectable in upper respiratory specimens during the acute phase of infection. The lowest concentration of SARS-CoV-2 viral copies this assay can detect is 138 copies/mL. A negative result does not preclude SARS-Cov-2 infection and should not be used as the sole basis for treatment or other patient management decisions. A negative result may occur with  improper specimen collection/handling, submission of specimen other than nasopharyngeal swab, presence of viral mutation(s) within the areas targeted by this assay, and inadequate number of viral copies(<138 copies/mL). A negative result must be combined with clinical observations, patient history, and epidemiological information. The expected result is Negative.  Fact Sheet for Patients:  EntrepreneurPulse.com.au  Fact Sheet for Healthcare Providers:  IncredibleEmployment.be  This test is no t yet approved or cleared by the [place] FDA and  has been authorized for detection and/or diagnosis of SARS-CoV-2 by FDA under an Emergency Use Authorization (EUA). This EUA will remain  in effect (meaning this test can be used) for the duration of the COVID-19 declaration under Section 564(b)(1) of the Act, 21 U.S.C.section 360bbb-3(b)(1), unless the authorization is terminated  or revoked sooner.       Influenza A by PCR NEGATIVE NEGATIVE Final   Influenza B by PCR NEGATIVE NEGATIVE Final    Comment: (NOTE) The Xpert Xpress SARS-CoV-2/FLU/RSV plus assay is intended as an aid in the diagnosis of influenza from Nasopharyngeal swab specimens and should not be used as a sole basis for treatment. Nasal washings and aspirates are unacceptable for Xpert Xpress SARS-CoV-2/FLU/RSV testing.  Fact Sheet for Patients: EntrepreneurPulse.com.au  Fact Sheet for Healthcare Providers: IncredibleEmployment.be  This test is not yet approved or  cleared by the [place] FDA and has been authorized for detection and/or diagnosis of SARS-CoV-2 by FDA under an Emergency Use Authorization (EUA). This EUA will remain in effect (meaning this test can be used) for the duration of the COVID-19 declaration under Section 564(b)(1) of the Act, 21 U.S.C. section 360bbb-3(b)(1), unless the authorization is terminated or revoked.  Performed at [facility], [address] [place], [place] [address]     Radiology Reports DG Tibia/Fibula Left  Result Date: 12/27/2020 CLINICAL DATA:  Fall EXAM: LEFT TIBIA AND FIBULA - 2 VIEW COMPARISON:  None. FINDINGS: Mild femorotibial chondrocalcinosis. No acute fracture or dislocation. Joint spaces maintained. IMPRESSION: No acute fracture or dislocation of the left tibia or fibula. Electronically Signed   By: Ulyses Jarred M.D.   On: 12/27/2020 22:25   CT HEAD WO CONTRAST (5MM)  Result Date: 12/27/2020 CLINICAL DATA:  Pt. States they have been having back pain for awhile but can't say for how long. Pt. Has unexplained bruising Head trauma, coagulopathy (Age [age]y.). History of intra-ocular prosthesis insertion. EXAM: CT HEAD WITHOUT CONTRAST TECHNIQUE: Contiguous axial images were obtained from the base of the skull through the vertex without intravenous contrast. COMPARISON:  CT head 03/04/2020 FINDINGS: Brain: Cerebral ventricle sizes are concordant with the degree of cerebral volume loss. Patchy and confluent areas of decreased attenuation are noted throughout the deep and periventricular white matter of the cerebral hemispheres bilaterally, compatible with chronic microvascular ischemic disease. Chronic right cerebellar infarction. No evidence of large-territorial acute infarction. No parenchymal hemorrhage. No mass lesion. No extra-axial collection. No mass effect or midline shift. No  hydrocephalus. Basilar cisterns are patent. Vascular: No hyperdense vessel. Atherosclerotic calcifications are present  within the cavernous internal carotid arteries. Skull: No acute fracture or focal lesion. Sinuses/Orbits: Paranasal sinuses and mastoid air cells are clear. Left orbital surgical changes with interval removal of a left orbital prosthesis. 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 Otherwise the right orbit are unremarkable. Other: None. IMPRESSION: No acute intracranial abnormality. Electronically Signed   By: Iven Finn M.D.   On: 12/27/2020 22:20   CT CHEST ABDOMEN PELVIS W CONTRAST  Result Date: 12/27/2020 CLINICAL DATA:  Generalized bruising without definitive traumatic history, initial encounter EXAM: CT CHEST, ABDOMEN, AND PELVIS WITH CONTRAST TECHNIQUE: Multidetector CT imaging of the chest, abdomen and pelvis was performed following the standard protocol during bolus administration of intravenous contrast. CONTRAST:  79m OMNIPAQUE IOHEXOL 350 MG/ML SOLN COMPARISON:  04/23/2017 FINDINGS: CT CHEST FINDINGS Cardiovascular: Thoracic aorta demonstrates atherosclerotic calcifications. Mild ectasia of the ascending aorta is noted without true aneurysmal dilatation. Coronary calcifications are seen. No dissection is noted. No cardiac enlargement is seen. The pulmonary artery as visualized is within normal limits. Mediastinum/Nodes: Thoracic inlet is within normal limits. No sizable hilar or mediastinal adenopathy is noted. Small sliding-type hiatal hernia is noted. Lungs/Pleura: Mild dependent atelectatic changes are seen. Small left-sided pleural effusion is noted. No pneumothorax is seen. Musculoskeletal: No acute rib abnormality is noted. No compression deformity is noted. In the left posterior chest wall just lateral to the spinous processes there are multifocal soft tissue hematomas identified with hematocrit levels within. Overlying soft tissue swelling is noted consistent with the given clinical history of bruising. No area of active extravasation is identified to suggest acute  hemorrhage. Given the hematocrit level these changes are likely subacute in nature. The largest of these measures 4.9 by 3.0 cm in transverse and AP dimensions. It extends for 11 cm in a craniocaudad projection CT ABDOMEN PELVIS FINDINGS Hepatobiliary: No focal liver abnormality is seen. No gallstones, gallbladder wall thickening, or biliary dilatation. Pancreas: Unremarkable. No pancreatic ductal dilatation or surrounding inflammatory changes. Spleen: Spleen is within normal limits. A heavily calcified splenic artery aneurysm is noted which measures approximately 13 mm stable in appearance from the prior exam. Adrenals/Urinary Tract: Adrenal glands are within normal limits. Kidneys demonstrate a normal enhancement pattern. No renal calculi or obstructive changes are seen. Normal excretion of contrast is noted. The ureters are unremarkable. The bladder is partially distended. Stomach/Bowel: Scattered diverticular change of the colon is noted without evidence of diverticulitis. The appendix is well visualized and within normal limits. Small bowel and stomach are unremarkable with the exception of the previously mentioned hiatal hernia. Vascular/Lymphatic: Aortic atherosclerosis. No enlarged abdominal or pelvic lymph nodes. Reproductive: Status post hysterectomy. No adnexal masses. Other: No abdominal wall hernia or abnormality. No abdominopelvic ascites. Musculoskeletal: Postsurgical changes in the proximal left femur are seen. Degenerative lumbar spine changes are noted. IMPRESSION: CT of the chest: No acute rib abnormality is noted. In the left posterior chest just lateral to the spinous processes there are 2 large hematomas identified with hematocrit levels indicating a subacute nature. No active extravasation of hemorrhage is noted. No associated bony abnormality is seen. This likely represents spontaneous hemorrhage from ovary anticoagulation given the elevated prothrombin time and INR. Small left pleural  effusion with dependent atelectatic changes. 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 Diverticulosis without diverticulitis. Hiatal hernia. Posterior soft tissue hematomas to the left of the midline in the paraspinal  soft tissues similar to that noted in the chest. Critical Value/emergent results were called by telephone at the time of interpretation on 12/27/2020 at 10:22 pm to [facility] , who verbally acknowledged these results. Electronically Signed   By: Inez Catalina M.D.   On: 12/27/2020 22:22   DG Foot Complete Left  Result Date: 12/27/2020 CLINICAL DATA:  Fall EXAM: LEFT FOOT - COMPLETE 3+ VIEW COMPARISON:  None. FINDINGS: There is no evidence of fracture or dislocation. There is no evidence of arthropathy or other focal bone abnormality. Soft tissues are unremarkable. IMPRESSION: Negative. Electronically Signed   By: Ulyses Jarred M.D.   On: 12/27/2020 22:26   DG Femur Min 2 Views Left  Result Date: 12/27/2020 CLINICAL DATA:  Fall EXAM: LEFT FEMUR 2 VIEWS COMPARISON:  None. FINDINGS: Femoral antegrade intramedullary nail with interlocking component. No fracture or dislocation. No perihardware lucency. IMPRESSION: No fracture or dislocation of the left femur. Electronically Signed   By: Ulyses Jarred M.D.   On: 12/27/2020 22:24     CBC Recent Labs  Lab 12/27/20 1948 12/28/20 0503 12/28/20 1311 12/28/20 2142 12/29/20 0414 12/30/20 0601 12/31/20 0808  WBC 31.3* 25.6* 26.2*  --  21.8* 15.3* 18.7*  HGB 12.2 10.7* 10.3* 9.9* 9.9* 9.2* 9.4*  HCT 36.4 32.1* 31.8* 30.3* 30.2* 28.4* 28.8*  PLT 1,180* 845* 932*  --  849* 730* 713*  MCV 90.3 88.9 90.6  --  91.2 91.3 90.3  MCH 30.3 29.6 29.3  --  29.9 29.6 29.5  MCHC 33.5 33.3 32.4  --  32.8 32.4 32.6  RDW 14.0 13.8 14.3  --  14.0 13.7 13.4  LYMPHSABS 0.9  --   --   --   --   --   --   MONOABS 2.5*  --   --   --   --   --   --   EOSABS 0.0  --   --   --   --   --   --   BASOSABS  0.2*  --   --   --   --   --   --     Chemistries  Recent Labs  Lab 12/27/20 1948 12/28/20 0503 12/29/20 0414 12/30/20 0601  NA 134* 135 134* 136  K 3.6 4.0 3.9 3.8  CL 101 101 99 100  CO2 24 21* 27 29  GLUCOSE 212* 146* 123* 121*  BUN 27* 21 31* 33*  CREATININE 0.93 0.58 0.73 0.76  CALCIUM 8.8* 8.5* 8.5* 8.4*  AST 28  --   --   --   ALT 19  --   --   --   ALKPHOS 72  --   --   --   BILITOT 0.7  --   --   --    ------------------------------------------------------------------------------------------------------------------ No results for input(s): CHOL, HDL, LDLCALC, TRIG, CHOLHDL, LDLDIRECT in the last 72 hours.  Lab Results  Component Value Date   HGBA1C 5.9 (H) 12/28/2020   ------------------------------------------------------------------------------------------------------------------ No results for input(s): TSH, T4TOTAL, T3FREE, THYROIDAB in the last 72 hours.  Invalid input(s): FREET3 ------------------------------------------------------------------------------------------------------------------ No results for input(s): VITAMINB12, FOLATE, FERRITIN, TIBC, IRON, RETICCTPCT in the last 72 hours.  Coagulation profile Recent Labs  Lab 12/28/20 1311 12/28/20 2142 12/29/20 0414 12/30/20 0601 12/31/20 0533  INR >10.0* 1.9* 1.4* 1.2 1.5*    No results for input(s): DDIMER in the last 72 hours.  Cardiac Enzymes No results for input(s): CKMB, TROPONINI, MYOGLOBIN in the last 168 hours.  Invalid input(s): CK ------------------------------------------------------------------------------------------------------------------ No results found for: BNP   Deatra James M.D on 12/31/2020 at 12:46 PM  Go to [email or web address] - for contact info  [facility] - Office  [phone]

## 2020-12-31 NOTE — Progress Notes (Signed)
This nurse attempted to get pt up OOB to dangle on side of bed and eventually sit in recliner chair. Pt refused and stated she will give it a try after lunch.

## 2020-12-31 NOTE — Progress Notes (Addendum)
Physical Therapy Treatment Patient Details Name: Ana [name] MRN: [id number] DOB: 07/23/1945 Today's Date: 12/31/2020    History of Present Illness Ana [name] is a 75 y.o. female presents to the emergency department for evaluation of back pain and bruising. Radiographs L leg are negative for acute osseous abnormalities; Head CT negative for acute intracranial abnormality;  CT of chest, abdomen, and pelvis notable for multiple soft tissue hematomas with hematocrit level and no evidence for active hemorrhage. CBC features a leukocytosis 31,300 and thrombocytosis with platelets 1,180,000.  INR is >10. PMH: HTN, history of CVA, mechanical aortic valve on warfarin, refusal of blood products, chronic L eye blindness, seizures    PT Comments    Patient apprehensive to attempt functional mobility and required encouragement to participate with therapy.  Patient demonstrates slow labored movement for sitting up at bedside requiring rest breaks due to fatigue, limited to a few steps at bedside before having to sit secondary to bilateral ankle pain/discomfort.  Patient tolerated sitting up in chair after therapy - RN aware.  Patient will benefit from continued physical therapy in hospital and recommended venue below to increase strength, balance, endurance for safe ADLs and gait.      Follow Up Recommendations  SNF;Supervision for mobility/OOB;Supervision - Intermittent     Equipment Recommendations  None recommended by PT    Recommendations for Other Services       Precautions / Restrictions Precautions Precautions: Fall Restrictions Weight Bearing Restrictions: No    Mobility  Bed Mobility Overal bed mobility: Needs Assistance Bed Mobility: Supine to Sit     Supine to sit: Min assist     General bed mobility comments: increased time, labored movement    Transfers Overall transfer level: Needs assistance Equipment used: Rolling walker (2 wheeled) Transfers: Sit to/from  [facility] Sit to Stand: Min assist Stand pivot transfers: Min assist;Mod assist       General transfer comment: slow labored movement  Ambulation/Gait Ambulation/Gait assistance: Min assist;Mod assist Gait Distance (Feet): 4 Feet Assistive device: Rolling walker (2 wheeled) Gait Pattern/deviations: Decreased step length - right;Decreased step length - left;Decreased stride length Gait velocity: decreased   General Gait Details: limited to 4-5 slow labored steps before having to sit due to c/o bilateral ankle pain and fatigue   Stairs             Wheelchair Mobility    Modified Rankin (Stroke Patients Only)       Balance Overall balance assessment: Needs assistance Sitting-balance support: Feet supported;No upper extremity supported Sitting balance-Leahy Scale: Fair Sitting balance - Comments: fair/good seated at EOB   Standing balance support: During functional activity;Bilateral upper extremity supported Standing balance-Leahy Scale: Fair Standing balance comment: using RW                            Cognition Arousal/Alertness: Awake/alert Behavior During Therapy: Anxious Overall Cognitive Status: Within Functional Limits for tasks assessed                                        Exercises      General Comments        Pertinent Vitals/Pain Pain Assessment: Faces Faces Pain Scale: Hurts even more Pain Location: bilateral ankles Pain Descriptors / Indicators: Sore;Aching Pain Intervention(s): Limited activity within patient's tolerance;Monitored during session;Repositioned;Patient requesting pain meds-RN notified  Home Living                      Prior Function            PT Goals (current goals can now be found in the care plan section) Acute Rehab PT Goals Patient Stated Goal: return home with family to assist PT Goal Formulation: With patient Time For Goal Achievement:  01/12/21 Potential to Achieve Goals: Good Progress towards PT goals: Progressing toward goals    Frequency    Min 3X/week      PT Plan Current plan remains appropriate    Co-evaluation              AM-PAC PT "6 Clicks" Mobility   Outcome Measure  Help needed turning from your back to your side while in a flat bed without using bedrails?: A Little Help needed moving from lying on your back to sitting on the side of a flat bed without using bedrails?: A Little Help needed moving to and from a bed to a chair (including a wheelchair)?: A Lot Help needed standing up from a chair using your arms (e.g., wheelchair or bedside chair)?: A Lot Help needed to walk in hospital room?: A Lot Help needed climbing 3-5 steps with a railing? : A Lot 6 Click Score: 14    End of Session   Activity Tolerance: Patient tolerated treatment well;Patient limited by fatigue Patient left: in chair;with call bell/phone within reach Nurse Communication: Mobility status PT Visit Diagnosis: Unsteadiness on feet (R26.81);Other abnormalities of gait and mobility (R26.89);Muscle weakness (generalized) (M62.81)     Time: [id number] PT Time Calculation (min) (ACUTE ONLY): 23 min  Charges:  $Therapeutic Activity: 23-37 mins                     3:37 PM, 12/31/20 Lonell Grandchild, MPT Physical Therapist with [facility] [phone] office [phone] mobile phone

## 2020-12-31 NOTE — Progress Notes (Signed)
Pt has refused several times to get out of bed to dangle on side of bed and also to get in recliner chair. Pt also refused personal care offered by staff members. MD made aware.

## 2020-12-31 NOTE — TOC Initial Note (Signed)
Transition of Care [facility]) - Initial/Assessment Note    Patient Details  Name: Ana [name] MRN: [id number] Date of Birth: Nov 21, 1945  Transition of Care [facility]) CM/SW Contact:    [name], LCSW Phone Number: 12/31/2020, 1:33 PM  Clinical Narrative:                 Patient from home with son, [name]. At baseline she is bed bound. PT recommendations discussed. Not interested in SNF. Does not think they want HH PT or OPPT.  Upon d/c patient may begin residing with daughter or other family member.   Expected Discharge Plan: Home/Self Care Barriers to Discharge: Continued Medical Work up   Patient Goals and CMS Choice Patient states their goals for this hospitalization and ongoing recovery are:: return home      Expected Discharge Plan and Services Expected Discharge Plan: Home/Self Care       Living arrangements for the past 2 months: Single Family Home                                      Prior Living Arrangements/Services Living arrangements for the past 2 months: Single Family Home Lives with:: Adult Children Patient language and need for interpreter reviewed:: Yes Do you feel safe going back to the place where you live?: Yes      Need for Family Participation in Patient Care: Yes (Comment) Care giver support system in place?: Yes (comment)   Criminal Activity/Legal Involvement Pertinent to Current Situation/Hospitalization: No - Comment as needed  Activities of Daily Living Home Assistive Devices/Equipment: Cane (specify quad or straight) ADL Screening (condition at time of admission) Patient's cognitive ability adequate to safely complete daily activities?: No Is the patient deaf or have difficulty hearing?: No Does the patient have difficulty seeing, even when wearing glasses/contacts?: No Does the patient have difficulty concentrating, remembering, or making decisions?: Yes Patient able to express need for assistance with ADLs?: Yes Does the patient  have difficulty dressing or bathing?: Yes Independently performs ADLs?: No Communication: Independent Dressing (OT): Needs assistance Is this a change from baseline?: Pre-admission baseline Grooming: Needs assistance Is this a change from baseline?: Pre-admission baseline Feeding: Independent Bathing: Needs assistance Is this a change from baseline?: Pre-admission baseline Toileting: Independent In/Out Bed: Needs assistance Is this a change from baseline?: Pre-admission baseline Walks in Home: Needs assistance Is this a change from baseline?: Pre-admission baseline Does the patient have difficulty walking or climbing stairs?: Yes Weakness of Legs: Both Weakness of Arms/Hands: Both  Permission Sought/Granted Permission sought to share information with : Family Supports    Share Information with NAME: [name]           Emotional Assessment     Affect (typically observed): Appropriate Orientation: : Oriented to Self, Oriented to Place, Oriented to Situation Alcohol / Substance Use: Not Applicable Psych Involvement: No (comment)  Admission diagnosis:  Trauma [T14.90XA] Hematoma [T14.8XXA] Acute cystitis with hematuria [N30.01] Supratherapeutic INR [R79.1] Patient Active Problem List   Diagnosis Date Noted   Intrathoracic hematoma 12/28/2020   Intramuscular hematoma 12/28/2020   Blood transfusion declined because patient is Jehovah's Witness 12/28/2020   Acute cystitis with hematuria    Supratherapeutic INR 12/27/2020   Closed comminuted intertrochanteric fracture of proximal femur, left, initial encounter ([place]) 03/04/2020   H/o CVA (cerebral vascular accident) ([place]) 03/04/2020   Leukocytosis 03/04/2020   Fall at home, initial encounter  03/04/2020   Hyperglycemia 03/04/2020   Closed left femoral fracture ([place]) 03/04/2020   Elevated vitamin B12 level 04/16/2017   Cholelithiasis 01/29/2017   Acute cholecystitis 01/29/2017   Polycythemia 01/29/2017   Thrombocytosis  01/29/2017   Chronic anticoagulation 11/30/2016   Encounter for screening colonoscopy 11/30/2016   Iron deficiency 11/10/2016   Displaced comminuted fracture of right patella 07/16/2014   Patella fracture 07/16/2014   CHEST PAIN-PRECORDIAL 09/24/2009   Hyperlipidemia 11/16/2008   DEPRESSION 11/16/2008   Essential hypertension 11/16/2008   AORTIC INSUFFICIENCY 11/16/2008   IBS 11/16/2008   ARTHRITIS 11/16/2008   Seizure disorder ([place]) 11/16/2008   CHRONIC FATIGUE SYNDROME 11/16/2008   THYROID DISEASE, HX OF 11/16/2008   PCP:  Celene Squibb, MD Pharmacy:   [facility] [place], [place] AT [place] [id number] [place] [address] Phone: [phone] Fax: [phone]     Social Determinants of Health (SDOH) Interventions    Readmission Risk Interventions No flowsheet data found.
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
## 2021-01-01 DIAGNOSIS — R791 Abnormal coagulation profile: Secondary | ICD-10-CM | POA: Diagnosis not present

## 2021-01-01 LAB — CULTURE, BLOOD (ROUTINE X 2)
Culture: NO GROWTH
Culture: NO GROWTH

## 2021-01-01 LAB — CBC
HCT: 25.5 % — ABNORMAL LOW (ref 36.0–46.0)
Hemoglobin: 8.2 g/dL — ABNORMAL LOW (ref 12.0–15.0)
MCH: 29.8 pg (ref 26.0–34.0)
MCHC: 32.2 g/dL (ref 30.0–36.0)
MCV: 92.7 fL (ref 80.0–100.0)
Platelets: 646 10*3/uL — ABNORMAL HIGH (ref 150–400)
RBC: 2.75 MIL/uL — ABNORMAL LOW (ref 3.87–5.11)
RDW: 13.9 % (ref 11.5–15.5)
WBC: 14.4 10*3/uL — ABNORMAL HIGH (ref 4.0–10.5)
nRBC: 0 % (ref 0.0–0.2)

## 2021-01-01 LAB — BASIC METABOLIC PANEL
Anion gap: 8 (ref 5–15)
BUN: 27 mg/dL — ABNORMAL HIGH (ref 8–23)
CO2: 30 mmol/L (ref 22–32)
Calcium: 8.6 mg/dL — ABNORMAL LOW (ref 8.9–10.3)
Chloride: 99 mmol/L (ref 98–111)
Creatinine, Ser: 0.78 mg/dL (ref 0.44–1.00)
GFR, Estimated: >60 mL/min (ref >60)
Glucose, Bld: 104 mg/dL — ABNORMAL HIGH (ref 70–99)
Potassium: 3.5 mmol/L (ref 3.5–5.1)
Sodium: 137 mmol/L (ref 135–145)

## 2021-01-01 MED ORDER — CIPROFLOXACIN HCL 250 MG PO TABS
500.0000 mg | ORAL_TABLET | Freq: Two times a day (BID) | ORAL | Status: AC
  Administered 2021-01-02 (×3): 500 mg via ORAL
  Filled 2021-01-01 (×3): qty 2

## 2021-01-01 MED ORDER — WARFARIN SODIUM 5 MG PO TABS
5.0000 mg | ORAL_TABLET | Freq: Once | ORAL | Status: AC
  Administered 2021-01-01: 5 mg via ORAL
  Filled 2021-01-01: qty 1

## 2021-01-01 MED ORDER — SENNOSIDES-DOCUSATE SODIUM 8.6-50 MG PO TABS
1.0000 | ORAL_TABLET | Freq: Every day | ORAL | Status: DC
  Administered 2021-01-02 – 2021-01-03 (×3): 1 via ORAL
  Filled 2021-01-01 (×4): qty 1

## 2021-01-01 NOTE — Progress Notes (Signed)
Patient Demographics:    Ana [name], is a 75 y.o. female, DOB - 05/24/1945, [id number]  Admit date - 12/27/2020   Admitting Physician Courage Denton Brick, MD  Outpatient Primary MD for the patient is Celene Squibb, MD  LOS - 5   No chief complaint on file.       Subjective:    Ana [name] was seen and examined this morning, laying in bed, not motivated to move or get out of bed. No issues overnight..... Hemodynamically stable    Assessment  & Plan :    Principal Problem:   Supratherapeutic INR Active Problems:   Essential hypertension   Seizure disorder (HCC)   Thrombocytosis   H/o CVA (cerebral vascular accident) ([place])   Leukocytosis   Intrathoracic hematoma   Intramuscular hematoma   Blood transfusion declined because patient is Jehovah's Witness  Brief Summary:- 75 y.o. female with medical history significant for hypertension, history of CVA, mechanical aortic valve on warfarin, and refusal of blood products admitted with back pain and bruising and found to have elevated INR above 10 in the setting of chronic Coumadin therapy for mechanical valve  ----------------------------------------------------------------------------------------------------------------------------------------------  A/p 1)Supratherapeutic INR with hematomas--- -Mild drop in H&H, -Improved pain with pain medications -Hematoma stable..   CT chest abdomen and pelvis shows left posterior chest just lateral to the spinous processes there are 2 large subacute hematomas and Posterior soft tissue hematomas to the left of the midline in the paraspinal soft tissues similar to that noted in the chest. -CT head without ICH -X-rays of the left femur, left tibia-fibula on the left foot without acute findings -INR remains greater than 10 despite 2 doses of oral vitamin K 9 hours apart -Discussed with pharmacy will give IV  vitamin K   x 1 -Patient declines FFP as noted below in #3 -Continue to monitor H&H, PT/INR -INR >10 on admission , now 1.4 >>> 1.2, 1.5  2)Acute anemia - secondary to 1 above, hemoglobin is down to 10.7 from 12.1 admission >> 9.9, 9.2>> 9.4 ,>> 8.2  Monitoring  3)Social/Ethics--- patient is a full code, -Patient is a Jehovah witness and states categorically that she would Not accept FFP, PRBC or any other blood products even if she is bleeding out and even if she was at risk of dying -We will respect patient's religious views and honor her wishes  4)Persistent Leukocytosis and thrombocytosis- -- patient with longstanding history of polycythemia, outpatient follow-up with hematology as advised  5)Mechanical Valve- - S/p [employer] AVR placed in 1998 -Hold Coumadin due to # 1 above -INR goal 2-3 -Once stable, will resume Coumadin, with INR close to 2 -The risk and benefit of anticoagulation was discussed with patient and her son in detail Discussed with pharmacy, reinitiated Coumadin but on hold today due to drop in hemoglobin  6) UTI--urine culture positive for E. coli, pansensitive, previously on Macrobid,  Continue p.o. antibiotics of ciprofloxacin Leukocytosis but afebrile WBC 21.8, 15.3 >> 18.7, 14.4 oday  7) hyperglycemia--- no prior history of diabetes, A1c: 5.9  8)HTN--amlodipine 5 mg daily  9) severe debility-due to previous strokes, 1 eye blindness -Per patient's son post previous stroke patient is now basically chair bound, bedbound Has trouble with ambulating, needs assist with most  ADLs -Family providing as much as Possible at home -We will consult PT OT, await recommendation, likely would need extensive home health arrangement    Disposition/Need for in-Hospital Stay- patient unable to be discharged at this time due to --hematomas and  bruising in the setting of severe super therapeutic INR--requiring close monitoring and IV vitamin K with very increased bleeding  risk*  Status is: Inpatient  Remains inpatient appropriate because: Please see disposition above  Disposition: The patient is from: Home              Anticipated d/c is to: Home              Anticipated d/c date is: 1-2 days, once INR is close to 2, likely home with home health              Patient currently is not medically stable to d/c. Barriers: Not Clinically Stable-   Code Status :  -  Code Status: Full Code   Family Communication:    NA (patient is alert, awake and coherent)   Consults  :  na  DVT Prophylaxis  :   - SCDs    warfarin (COUMADIN) tablet 5 mg    Lab Results  Component Value Date   PLT 646 (H) 01/01/2021    Inpatient Medications  Scheduled Meds:  amLODipine  2.5 mg Oral Daily   ciprofloxacin  500 mg Oral BID   pantoprazole  40 mg Oral Daily   senna-docusate  1 tablet Oral QHS   warfarin  5 mg Oral ONCE-1600   Warfarin - Pharmacist Dosing Inpatient   Does not apply q1600   Continuous Infusions:   PRN Meds:.acetaminophen **OR** acetaminophen, HYDROcodone-acetaminophen, labetalol, melatonin, morphine injection, ondansetron **OR** ondansetron (ZOFRAN) IV    Anti-infectives (From admission, onward)    Start     Dose/Rate Route Frequency Ordered Stop   12/30/20 2000  ciprofloxacin (CIPRO) tablet 500 mg        500 mg Oral 2 times daily 12/30/20 1451     12/28/20 2100  cefTRIAXone (ROCEPHIN) 1 g in sodium chloride 0.9 % 100 mL IVPB  Status:  Discontinued        1 g 200 mL/hr over 30 Minutes Intravenous Every 24 hours 12/28/20 0318 12/28/20 0320   12/28/20 0800  nitrofurantoin (macrocrystal-monohydrate) (MACROBID) capsule 100 mg  Status:  Discontinued        100 mg Oral Every 12 hours 12/28/20 0322 12/30/20 1451   12/27/20 2100  cefTRIAXone (ROCEPHIN) 1 g in sodium chloride 0.9 % 100 mL IVPB        1 g 200 mL/hr over 30 Minutes Intravenous  Once 12/27/20 2049 12/27/20 2145         Objective:   Vitals:   12/31/20 2106 01/01/21 0000 01/01/21  0452 01/01/21 1300  BP: (!) 99/47 122/80 110/60 121/78  Pulse: 90  91 83  Resp:      Temp: 98.3 F (36.8 C)  98.1 F (36.7 C) 97.9 F (36.6 C)  TempSrc: Oral  Oral Oral  SpO2: 95%   97%  Weight:      Height:        Wt Readings from Last 3 Encounters:  12/29/20 101 kg  04/01/20 59 kg  03/04/20 59 kg     Intake/Output Summary (Last 24 hours) at 01/01/2021 1329 Last data filed at 12/31/2020 1831 Gross per 24 hour  Intake 240 ml  Output 250 ml  Net -10 ml  Physical Exam:   General:  Alert, oriented, cooperative, no distress; somnolent  HEENT:  1 eye blindness, normocephalic, PERRL, otherwise with in Normal limits   Neuro:  CNII-XII intact. , normal motor and sensation, reflexes intact   Lungs:   Clear to auscultation BL, Respirations unlabored, no wheezes / crackles  Cardio:    S1/S2, RRR, No murmure, No Rubs or Gallops   Abdomen:   Soft, non-tender, bowel sounds active all four quadrants,  no guarding or peritoneal signs.  Muscular skeletal:  Severe debility, moving all 4 extremities in bed, unable to ambulate without assist  No pitting edema  Skin:  Dry, warm to touch, negative for any Rashes,  Wounds: Please see nursing documentation              Data Review:   Micro Results Recent Results (from the past 240 hour(s))  Urine Culture     Status: Abnormal   Collection Time: 12/27/20  7:40 PM   Specimen: Urine, Clean Catch  Result Value Ref Range Status   Specimen Description   Final    URINE, CLEAN CATCH Performed at [facility], [address] [place], [place] [address]    Special Requests   Final    NONE Performed at [facility], [address] [place], [place] [address]    Culture >=100,000 COLONIES/mL ESCHERICHIA COLI (A)  Final   Report Status 12/30/2020 FINAL  Final   Organism ID, Bacteria ESCHERICHIA COLI (A)  Final      Susceptibility   Escherichia coli - MIC*    AMPICILLIN <=2 SENSITIVE Sensitive     CEFAZOLIN <=4 SENSITIVE Sensitive      CEFEPIME <=0.12 SENSITIVE Sensitive     CEFTRIAXONE <=0.25 SENSITIVE Sensitive     CIPROFLOXACIN <=0.25 SENSITIVE Sensitive     GENTAMICIN <=1 SENSITIVE Sensitive     IMIPENEM <=0.25 SENSITIVE Sensitive     NITROFURANTOIN <=16 SENSITIVE Sensitive     TRIMETH/SULFA <=20 SENSITIVE Sensitive     AMPICILLIN/SULBACTAM <=2 SENSITIVE Sensitive     PIP/TAZO <=4 SENSITIVE Sensitive     * >=100,000 COLONIES/mL ESCHERICHIA COLI  Blood culture (routine x 2)     Status: None   Collection Time: 12/27/20  8:58 PM   Specimen: BLOOD  Result Value Ref Range Status   Specimen Description BLOOD LEFT ANTECUBITAL  Final   Special Requests   Final    BOTTLES DRAWN AEROBIC AND ANAEROBIC Blood Culture results may not be optimal due to an excessive volume of blood received in culture bottles   Culture   Final    NO GROWTH 5 DAYS Performed at [facility], [address], [place], [place] [address]    Report Status 01/01/2021 FINAL  Final  Blood culture (routine x 2)     Status: None   Collection Time: 12/27/20  8:58 PM   Specimen: BLOOD  Result Value Ref Range Status   Specimen Description BLOOD RIGHT ANTECUBITAL  Final   Special Requests   Final    BOTTLES DRAWN AEROBIC AND ANAEROBIC Blood Culture results may not be optimal due to an excessive volume of blood received in culture bottles   Culture   Final    NO GROWTH 5 DAYS Performed at [facility], [address] [place], [place] [address]    Report Status 01/01/2021 FINAL  Final  Resp Panel by RT-PCR (Flu A&B, Covid) Nasopharyngeal Swab     Status: None   Collection Time: 12/27/20  9:10 PM   Specimen:  Nasopharyngeal Swab; Nasopharyngeal(NP) swabs in vial transport medium  Result Value Ref Range Status   SARS Coronavirus 2 by RT PCR NEGATIVE NEGATIVE Final    Comment: (NOTE) SARS-CoV-2 target nucleic acids are NOT DETECTED.  The SARS-CoV-2 RNA is generally detectable in upper respiratory specimens during the acute phase of infection. The  lowest concentration of SARS-CoV-2 viral copies this assay can detect is 138 copies/mL. A negative result does not preclude SARS-Cov-2 infection and should not be used as the sole basis for treatment or other patient management decisions. A negative result may occur with  improper specimen collection/handling, submission of specimen other than nasopharyngeal swab, presence of viral mutation(s) within the areas targeted by this assay, and inadequate number of viral copies(<138 copies/mL). A negative result must be combined with clinical observations, patient history, and epidemiological information. The expected result is Negative.  Fact Sheet for Patients:  EntrepreneurPulse.com.au  Fact Sheet for Healthcare Providers:  IncredibleEmployment.be  This test is no t yet approved or cleared by the [place] FDA and  has been authorized for detection and/or diagnosis of SARS-CoV-2 by FDA under an Emergency Use Authorization (EUA). This EUA will remain  in effect (meaning this test can be used) for the duration of the COVID-19 declaration under Section 564(b)(1) of the Act, 21 U.S.C.section 360bbb-3(b)(1), unless the authorization is terminated  or revoked sooner.       Influenza A by PCR NEGATIVE NEGATIVE Final   Influenza B by PCR NEGATIVE NEGATIVE Final    Comment: (NOTE) The Xpert Xpress SARS-CoV-2/FLU/RSV plus assay is intended as an aid in the diagnosis of influenza from Nasopharyngeal swab specimens and should not be used as a sole basis for treatment. Nasal washings and aspirates are unacceptable for Xpert Xpress SARS-CoV-2/FLU/RSV testing.  Fact Sheet for Patients: EntrepreneurPulse.com.au  Fact Sheet for Healthcare Providers: IncredibleEmployment.be  This test is not yet approved or cleared by the [place] FDA and has been authorized for detection and/or diagnosis of SARS-CoV-2 by FDA under  an Emergency Use Authorization (EUA). This EUA will remain in effect (meaning this test can be used) for the duration of the COVID-19 declaration under Section 564(b)(1) of the Act, 21 U.S.C. section 360bbb-3(b)(1), unless the authorization is terminated or revoked.  Performed at [facility], [address], [place],  [address]     Radiology Reports DG Tibia/Fibula Left  Result Date: 12/27/2020 CLINICAL DATA:  Fall EXAM: LEFT TIBIA AND FIBULA - 2 VIEW COMPARISON:  None. FINDINGS: Mild femorotibial chondrocalcinosis. No acute fracture or dislocation. Joint spaces maintained. IMPRESSION: No acute fracture or dislocation of the left tibia or fibula. Electronically Signed   By: Ulyses Jarred M.D.   On: 12/27/2020 22:25   CT HEAD WO CONTRAST (5MM)  Result Date: 12/27/2020 CLINICAL DATA:  Pt. States they have been having back pain for awhile but can't say for how long. Pt. Has unexplained bruising Head trauma, coagulopathy (Age 46-65y.). History of intra-ocular prosthesis insertion. EXAM: CT HEAD WITHOUT CONTRAST TECHNIQUE: Contiguous axial images were obtained from the base of the skull through the vertex without intravenous contrast. COMPARISON:  CT head 03/04/2020 FINDINGS: Brain: Cerebral ventricle sizes are concordant with the degree of cerebral volume loss. Patchy and confluent areas of decreased attenuation are noted throughout the deep and periventricular white matter of the cerebral hemispheres bilaterally, compatible with chronic microvascular ischemic disease. Chronic right cerebellar infarction. No evidence of large-territorial acute infarction. No parenchymal hemorrhage. No mass lesion. No extra-axial collection. No mass effect or midline  shift. No hydrocephalus. Basilar cisterns are patent. Vascular: No hyperdense vessel. Atherosclerotic calcifications are present within the cavernous internal carotid arteries. Skull: No acute fracture or focal lesion. Sinuses/Orbits: Paranasal  sinuses and mastoid air cells are clear. Left orbital surgical changes with interval removal of a left orbital prosthesis. 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 Otherwise the right orbit are unremarkable. Other: None. IMPRESSION: No acute intracranial abnormality. Electronically Signed   By: Iven Finn M.D.   On: 12/27/2020 22:20   CT CHEST ABDOMEN PELVIS W CONTRAST  Result Date: 12/27/2020 CLINICAL DATA:  Generalized bruising without definitive traumatic history, initial encounter EXAM: CT CHEST, ABDOMEN, AND PELVIS WITH CONTRAST TECHNIQUE: Multidetector CT imaging of the chest, abdomen and pelvis was performed following the standard protocol during bolus administration of intravenous contrast. CONTRAST:  59m OMNIPAQUE IOHEXOL 350 MG/ML SOLN COMPARISON:  04/23/2017 FINDINGS: CT CHEST FINDINGS Cardiovascular: Thoracic aorta demonstrates atherosclerotic calcifications. Mild ectasia of the ascending aorta is noted without true aneurysmal dilatation. Coronary calcifications are seen. No dissection is noted. No cardiac enlargement is seen. The pulmonary artery as visualized is within normal limits. Mediastinum/Nodes: Thoracic inlet is within normal limits. No sizable hilar or mediastinal adenopathy is noted. Small sliding-type hiatal hernia is noted. Lungs/Pleura: Mild dependent atelectatic changes are seen. Small left-sided pleural effusion is noted. No pneumothorax is seen. Musculoskeletal: No acute rib abnormality is noted. No compression deformity is noted. In the left posterior chest wall just lateral to the spinous processes there are multifocal soft tissue hematomas identified with hematocrit levels within. Overlying soft tissue swelling is noted consistent with the given clinical history of bruising. No area of active extravasation is identified to suggest acute hemorrhage. Given the hematocrit level these changes are likely subacute in nature. The largest of these measures 4.9 by  3.0 cm in transverse and AP dimensions. It extends for 11 cm in a craniocaudad projection CT ABDOMEN PELVIS FINDINGS Hepatobiliary: No focal liver abnormality is seen. No gallstones, gallbladder wall thickening, or biliary dilatation. Pancreas: Unremarkable. No pancreatic ductal dilatation or surrounding inflammatory changes. Spleen: Spleen is within normal limits. A heavily calcified splenic artery aneurysm is noted which measures approximately 13 mm stable in appearance from the prior exam. Adrenals/Urinary Tract: Adrenal glands are within normal limits. Kidneys demonstrate a normal enhancement pattern. No renal calculi or obstructive changes are seen. Normal excretion of contrast is noted. The ureters are unremarkable. The bladder is partially distended. Stomach/Bowel: Scattered diverticular change of the colon is noted without evidence of diverticulitis. The appendix is well visualized and within normal limits. Small bowel and stomach are unremarkable with the exception of the previously mentioned hiatal hernia. Vascular/Lymphatic: Aortic atherosclerosis. No enlarged abdominal or pelvic lymph nodes. Reproductive: Status post hysterectomy. No adnexal masses. Other: No abdominal wall hernia or abnormality. No abdominopelvic ascites. Musculoskeletal: Postsurgical changes in the proximal left femur are seen. Degenerative lumbar spine changes are noted. IMPRESSION: CT of the chest: No acute rib abnormality is noted. In the left posterior chest just lateral to the spinous processes there are 2 large hematomas identified with hematocrit levels indicating a subacute nature. No active extravasation of hemorrhage is noted. No associated bony abnormality is seen. This likely represents spontaneous hemorrhage from ovary anticoagulation given the elevated prothrombin time and INR. Small left pleural effusion with dependent atelectatic changes. 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 Diverticulosis without diverticulitis. Hiatal hernia. Posterior soft tissue hematomas to the left of the midline in  the paraspinal soft tissues similar to that noted in the chest. Critical Value/emergent results were called by telephone at the time of interpretation on 12/27/2020 at 10:22 pm to [facility] , who verbally acknowledged these results. Electronically Signed   By: Inez Catalina M.D.   On: 12/27/2020 22:22   DG Foot Complete Left  Result Date: 12/27/2020 CLINICAL DATA:  Fall EXAM: LEFT FOOT - COMPLETE 3+ VIEW COMPARISON:  None. FINDINGS: There is no evidence of fracture or dislocation. There is no evidence of arthropathy or other focal bone abnormality. Soft tissues are unremarkable. IMPRESSION: Negative. Electronically Signed   By: Ulyses Jarred M.D.   On: 12/27/2020 22:26   DG Femur Min 2 Views Left  Result Date: 12/27/2020 CLINICAL DATA:  Fall EXAM: LEFT FEMUR 2 VIEWS COMPARISON:  None. FINDINGS: Femoral antegrade intramedullary nail with interlocking component. No fracture or dislocation. No perihardware lucency. IMPRESSION: No fracture or dislocation of the left femur. Electronically Signed   By: Ulyses Jarred M.D.   On: 12/27/2020 22:24     CBC Recent Labs  Lab 12/27/20 1948 12/28/20 0503 12/28/20 1311 12/28/20 2142 12/29/20 0414 12/30/20 0601 12/31/20 0808 01/01/21 0651  WBC 31.3*   < > 26.2*  --  21.8* 15.3* 18.7* 14.4*  HGB 12.2   < > 10.3* 9.9* 9.9* 9.2* 9.4* 8.2*  HCT 36.4   < > 31.8* 30.3* 30.2* 28.4* 28.8* 25.5*  PLT 1,180*   < > 932*  --  849* 730* 713* 646*  MCV 90.3   < > 90.6  --  91.2 91.3 90.3 92.7  MCH 30.3   < > 29.3  --  29.9 29.6 29.5 29.8  MCHC 33.5   < > 32.4  --  32.8 32.4 32.6 32.2  RDW 14.0   < > 14.3  --  14.0 13.7 13.4 13.9  LYMPHSABS 0.9  --   --   --   --   --   --   --   MONOABS 2.5*  --   --   --   --   --   --   --   EOSABS 0.0  --   --   --   --   --   --   --   BASOSABS 0.2*  --   --   --   --   --   --   --    < > =  values in this interval not displayed.    Chemistries  Recent Labs  Lab 12/27/20 1948 12/28/20 0503 12/29/20 0414 12/30/20 0601 01/01/21 0611  NA 134* 135 134* 136 137  K 3.6 4.0 3.9 3.8 3.5  CL 101 101 99 100 99  CO2 24 21* '27 29 30  '$ GLUCOSE 212* 146* 123* 121* 104*  BUN 27* 21 31* 33* 27*  CREATININE 0.93 0.58 0.73 0.76 0.78  CALCIUM 8.8* 8.5* 8.5* 8.4* 8.6*  AST 28  --   --   --   --   ALT 19  --   --   --   --   ALKPHOS 72  --   --   --   --   BILITOT 0.7  --   --   --   --    ------------------------------------------------------------------------------------------------------------------ No results for input(s): CHOL, HDL, LDLCALC, TRIG, CHOLHDL, LDLDIRECT in the last 72 hours.  Lab Results  Component Value Date   HGBA1C 5.9 (H) 12/28/2020   ------------------------------------------------------------------------------------------------------------------ No results for input(s):  TSH, T4TOTAL, T3FREE, THYROIDAB in the last 72 hours.  Invalid input(s): FREET3 ------------------------------------------------------------------------------------------------------------------ No results for input(s): VITAMINB12, FOLATE, FERRITIN, TIBC, IRON, RETICCTPCT in the last 72 hours.  Coagulation profile Recent Labs  Lab 12/28/20 1311 12/28/20 2142 12/29/20 0414 12/30/20 0601 12/31/20 0533  INR >10.0* 1.9* 1.4* 1.2 1.5*    No results for input(s): DDIMER in the last 72 hours.  Cardiac Enzymes No results for input(s): CKMB, TROPONINI, MYOGLOBIN in the last 168 hours.  Invalid input(s): CK ------------------------------------------------------------------------------------------------------------------ No results found for: BNP   Deatra James M.D on 01/01/2021 at 1:29 PM  Go to [email or web address] - for contact info  [facility] - Office  [phone]
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
## 2021-01-02 DIAGNOSIS — R791 Abnormal coagulation profile: Secondary | ICD-10-CM | POA: Diagnosis not present

## 2021-01-02 LAB — CBC
HCT: 25.6 % — ABNORMAL LOW (ref 36.0–46.0)
Hemoglobin: 8.2 g/dL — ABNORMAL LOW (ref 12.0–15.0)
MCH: 29.4 pg (ref 26.0–34.0)
MCHC: 32 g/dL (ref 30.0–36.0)
MCV: 91.8 fL (ref 80.0–100.0)
Platelets: 686 K/uL — ABNORMAL HIGH (ref 150–400)
RBC: 2.79 MIL/uL — ABNORMAL LOW (ref 3.87–5.11)
RDW: 13.8 % (ref 11.5–15.5)
WBC: 13 K/uL — ABNORMAL HIGH (ref 4.0–10.5)
nRBC: 0 % (ref 0.0–0.2)

## 2021-01-02 LAB — BASIC METABOLIC PANEL
Anion gap: 8 (ref 5–15)
BUN: 23 mg/dL (ref 8–23)
CO2: 29 mmol/L (ref 22–32)
Calcium: 8.5 mg/dL — ABNORMAL LOW (ref 8.9–10.3)
Chloride: 101 mmol/L (ref 98–111)
Creatinine, Ser: 0.81 mg/dL (ref 0.44–1.00)
GFR, Estimated: >60 mL/min (ref >60)
Glucose, Bld: 106 mg/dL — ABNORMAL HIGH (ref 70–99)
Potassium: 3.5 mmol/L (ref 3.5–5.1)
Sodium: 138 mmol/L (ref 135–145)

## 2021-01-02 LAB — PROTIME-INR
INR: 2.1 — ABNORMAL HIGH (ref 0.8–1.2)
Prothrombin Time: 23.9 s — ABNORMAL HIGH (ref 11.4–15.2)

## 2021-01-02 MED ORDER — CIPROFLOXACIN HCL 500 MG PO TABS: 500.0000 mg | ORAL_TABLET | Freq: Two times a day (BID) | ORAL | 0 refills | Status: DC

## 2021-01-02 MED ORDER — MELATONIN 3 MG PO TABS: 3.0000 mg | ORAL_TABLET | Freq: Every evening | ORAL | 0 refills | Status: AC | PRN

## 2021-01-02 MED ORDER — WARFARIN SODIUM 5 MG PO TABS: 5.0000 mg | ORAL_TABLET | Freq: Once | ORAL | 0 refills | Status: AC

## 2021-01-02 MED ORDER — PANTOPRAZOLE SODIUM 40 MG PO TBEC: 40.0000 mg | DELAYED_RELEASE_TABLET | Freq: Every day | ORAL | 0 refills | Status: AC

## 2021-01-02 MED ORDER — WARFARIN SODIUM 5 MG PO TABS
5.0000 mg | ORAL_TABLET | Freq: Once | ORAL | Status: AC
  Administered 2021-01-02: 5 mg via ORAL
  Filled 2021-01-02: qty 1

## 2021-01-02 MED ORDER — ACETAMINOPHEN 325 MG PO TABS: 650.0000 mg | ORAL_TABLET | Freq: Four times a day (QID) | ORAL | 1 refills | Status: AC | PRN
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
## 2021-01-02 NOTE — Discharge Summary (Addendum)
Physician Discharge Summary [facility]    Patient: Ana [name]                   Admit date: 12/27/2020   DOB: 02-14-1946             Discharge date:01/02/2021/9:55 AM [id number]                          PCP: Celene Squibb, MD  Disposition: SNF  Recommendations for Outpatient Follow-up:   Follow up: With PCP within 1 week. -Note to PCP, lower dose of Coumadin daily if possible INR to be checked daily, with a goal to be close to 2.0 (high risk for CVA -On this admission H&H finally stabilized at 8.2, no further bleeding, (Jehovah's Witness no agreement to platelet or blood product transfusion) Severe debility-currently bedbound-not motivated to get out of bed or move out of bed   Discharge Condition: Stable   Code Status:   Code Status: Full Code  Diet recommendation: Regular healthy diet   Discharge Diagnoses:    Principal Problem:   Supratherapeutic INR Active Problems:   Essential hypertension   Seizure disorder ([place])   Thrombocytosis   H/o CVA (cerebral vascular accident) ([place])   Leukocytosis   Intrathoracic hematoma   Intramuscular hematoma   Blood transfusion declined because patient is Jehovah's Witness   History of Present Illness/ Hospital Course [name] Summary:    Brief Summary:- 75 y.o. female with medical history significant for hypertension, history of CVA, mechanical aortic valve on warfarin, and refusal of blood products admitted with back pain and bruising and found to have elevated INR above 10 in the setting of chronic Coumadin therapy for mechanical valve   ----------------------------------------------------------------------------------------------------------------------------------------------    1)Supratherapeutic INR with hematomas--- -H&H and hematoma stable, INR 2.1   CT chest abdomen and pelvis shows left posterior chest just lateral to the spinous processes there are 2 large subacute hematomas and Posterior soft tissue  hematomas to the left of the midline in the paraspinal soft tissues similar to that noted in the chest. -CT head without ICH -X-rays of the left femur, left tibia-fibula on the left foot without acute findings -INR remains greater than 10 despite 2 doses of oral vitamin K 9 hours apart -Discussed with pharmacy will give IV vitamin K   x 1 -Patient declines FFP as noted below in #3 -monitor H&H, q. Weekly,INR-to be checked daily for now -INR >10 on admission , now 1.4 >>> 1.2, 1.5 >>>2.1    2)Acute anemia - secondary to 1 above,  -Hemoglobin is down to 10.7 from 12.1 admission >> 9.9, 9.2>> 9.4 >> 8.2, 8.2       3)Social/Ethics--- patient is a full code, -Patient is a Jehovah witness and states categorically that she would Not accept FFP, PRBC or any other blood products even if she is bleeding out and even if she was at risk of dying -We will respect patient's religious views and honor her wishes   4)Persistent Leukocytosis and thrombocytosis- -- patient with longstanding history of polycythemia, outpatient follow-up with hematology as advised   5)Mechanical Valve- - S/p St Jude AVR placed in 1998 -Hold Coumadin due to # 1 above -INR goal 2-3 -Resume Coumadin at lower dose with INR close to 2, INR 2.1 today  -The risk and benefit of anticoagulation was discussed with patient and her son in detail Discussed with pharmacy, reinitiated Coumadin but on hold today due  to drop in hemoglobin   6) UTI--urine culture positive for E. coli, pansensitive, previously on Macrobid,  Continue p.o. antibiotics of ciprofloxacin Leukocytosis but afebrile WBC 21.8, 15.3 >> 18.7, 14.4 oday   7) hyperglycemia--- no prior history of diabetes, A1c: 5.9   8)HTN--amlodipine 5 mg daily   9) severe debility-due to previous strokes, 1 eye blindness -Per patient's son post previous stroke patient is now basically chair bound, bedbound Has trouble with ambulating, needs assist with most ADLs -Family  providing as much as Possible at home -Recommended SNF, but patient has refused, accepted home health        Status is: Inpatient   Remains inpatient appropriate because: Please see disposition above   Disposition: The patient is from: Home              Anticipated d/c is to: Home with home health   Code Status :  -  Code Status: Full Code          Discharge Instructions:   Discharge Instructions     Activity as tolerated - No restrictions   Complete by: As directed    Diet - low sodium heart healthy   Complete by: As directed    Discharge instructions   Complete by: As directed    Very close monitoring of INR, if possible check daily, current Coumadin dose 5 mg recommending INR to be close to 2.0... To avoid any further bleeding, and complication CBC within 5-7 days..  to be Reviewed by PCP   Increase activity slowly   Complete by: As directed         Medication List     STOP taking these medications    cephALEXin 500 MG capsule Commonly known as: KEFLEX   enoxaparin 30 MG/0.3ML injection Commonly known as: LOVENOX   ibuprofen 800 MG tablet Commonly known as: ADVIL       TAKE these medications    acetaminophen 500 MG tablet Commonly known as: TYLENOL Take 1 tablet (500 mg total) by mouth every 8 (eight) hours as needed for mild pain or moderate pain. What changed: Another medication with the same name was added. Make sure you understand how and when to take each.   acetaminophen 325 MG tablet Commonly known as: TYLENOL Take 2 tablets (650 mg total) by mouth every 6 (six) hours as needed for mild pain (or Fever >/= 101). What changed: You were already taking a medication with the same name, and this prescription was added. Make sure you understand how and when to take each.   amLODipine 10 MG tablet Commonly known as: NORVASC Take 10 mg by mouth every evening.   B-12 Compliance Injection 1000 MCG/ML Kit Generic drug: Cyanocobalamin Inject 1  mL as directed See admin instructions. EVERY 4 DAYS   ciprofloxacin 500 MG tablet Commonly known as: CIPRO Take 1 tablet (500 mg total) by mouth 2 (two) times daily for 5 days.   melatonin 3 MG Tabs tablet Take 1 tablet (3 mg total) by mouth at bedtime as needed.   pantoprazole 40 MG tablet Commonly known as: PROTONIX Take 1 tablet (40 mg total) by mouth daily. Start taking on: January 03, 2021   warfarin 5 MG tablet Commonly known as: COUMADIN Take 1 tablet (5 mg total) by mouth one time only at 4 PM. What changed:  medication strength how much to take when to take this additional instructions Another medication with the same name was removed. Continue taking this medication, and follow  the directions you see here.        Allergies  Allergen Reactions   Blood-Group Specific Substance     NO BLOOD PRODUCTS     Procedures /Studies:   DG Tibia/Fibula Left  Result Date: 12/27/2020 CLINICAL DATA:  Fall EXAM: LEFT TIBIA AND FIBULA - 2 VIEW COMPARISON:  None. FINDINGS: Mild femorotibial chondrocalcinosis. No acute fracture or dislocation. Joint spaces maintained. IMPRESSION: No acute fracture or dislocation of the left tibia or fibula. Electronically Signed   By: Ulyses Jarred M.D.   On: 12/27/2020 22:25   CT HEAD WO CONTRAST (5MM)  Result Date: 12/27/2020 CLINICAL DATA:  Pt. States they have been having back pain for awhile but can't say for how long. Pt. Has unexplained bruising Head trauma, coagulopathy (Age [age]y.). History of intra-ocular prosthesis insertion. EXAM: CT HEAD WITHOUT CONTRAST TECHNIQUE: Contiguous axial images were obtained from the base of the skull through the vertex without intravenous contrast. COMPARISON:  CT head 03/04/2020 FINDINGS: Brain: Cerebral ventricle sizes are concordant with the degree of cerebral volume loss. Patchy and confluent areas of decreased attenuation are noted throughout the deep and periventricular white matter of the cerebral  hemispheres bilaterally, compatible with chronic microvascular ischemic disease. Chronic right cerebellar infarction. No evidence of large-territorial acute infarction. No parenchymal hemorrhage. No mass lesion. No extra-axial collection. No mass effect or midline shift. No hydrocephalus. Basilar cisterns are patent. Vascular: No hyperdense vessel. Atherosclerotic calcifications are present within the cavernous internal carotid arteries. Skull: No acute fracture or focal lesion. Sinuses/Orbits: Paranasal sinuses and mastoid air cells are clear. Left orbital surgical changes with interval removal of a left orbital prosthesis. 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 Otherwise the right orbit are unremarkable. Other: None. IMPRESSION: No acute intracranial abnormality. Electronically Signed   By: Iven Finn M.D.   On: 12/27/2020 22:20   CT CHEST ABDOMEN PELVIS W CONTRAST  Result Date: 12/27/2020 CLINICAL DATA:  Generalized bruising without definitive traumatic history, initial encounter EXAM: CT CHEST, ABDOMEN, AND PELVIS WITH CONTRAST TECHNIQUE: Multidetector CT imaging of the chest, abdomen and pelvis was performed following the standard protocol during bolus administration of intravenous contrast. CONTRAST:  24m OMNIPAQUE IOHEXOL 350 MG/ML SOLN COMPARISON:  04/23/2017 FINDINGS: CT CHEST FINDINGS Cardiovascular: Thoracic aorta demonstrates atherosclerotic calcifications. Mild ectasia of the ascending aorta is noted without true aneurysmal dilatation. Coronary calcifications are seen. No dissection is noted. No cardiac enlargement is seen. The pulmonary artery as visualized is within normal limits. Mediastinum/Nodes: Thoracic inlet is within normal limits. No sizable hilar or mediastinal adenopathy is noted. Small sliding-type hiatal hernia is noted. Lungs/Pleura: Mild dependent atelectatic changes are seen. Small left-sided pleural effusion is noted. No pneumothorax is seen.  Musculoskeletal: No acute rib abnormality is noted. No compression deformity is noted. In the left posterior chest wall just lateral to the spinous processes there are multifocal soft tissue hematomas identified with hematocrit levels within. Overlying soft tissue swelling is noted consistent with the given clinical history of bruising. No area of active extravasation is identified to suggest acute hemorrhage. Given the hematocrit level these changes are likely subacute in nature. The largest of these measures 4.9 by 3.0 cm in transverse and AP dimensions. It extends for 11 cm in a craniocaudad projection CT ABDOMEN PELVIS FINDINGS Hepatobiliary: No focal liver abnormality is seen. No gallstones, gallbladder wall thickening, or biliary dilatation. Pancreas: Unremarkable. No pancreatic ductal dilatation or surrounding inflammatory changes. Spleen: Spleen is within normal limits. A heavily calcified splenic artery aneurysm is  noted which measures approximately 13 mm stable in appearance from the prior exam. Adrenals/Urinary Tract: Adrenal glands are within normal limits. Kidneys demonstrate a normal enhancement pattern. No renal calculi or obstructive changes are seen. Normal excretion of contrast is noted. The ureters are unremarkable. The bladder is partially distended. Stomach/Bowel: Scattered diverticular change of the colon is noted without evidence of diverticulitis. The appendix is well visualized and within normal limits. Small bowel and stomach are unremarkable with the exception of the previously mentioned hiatal hernia. Vascular/Lymphatic: Aortic atherosclerosis. No enlarged abdominal or pelvic lymph nodes. Reproductive: Status post hysterectomy. No adnexal masses. Other: No abdominal wall hernia or abnormality. No abdominopelvic ascites. Musculoskeletal: Postsurgical changes in the proximal left femur are seen. Degenerative lumbar spine changes are noted. IMPRESSION: CT of the chest: No acute rib  abnormality is noted. In the left posterior chest just lateral to the spinous processes there are 2 large hematomas identified with hematocrit levels indicating a subacute nature. No active extravasation of hemorrhage is noted. No associated bony abnormality is seen. This likely represents spontaneous hemorrhage from ovary anticoagulation given the elevated prothrombin time and INR. Small left pleural effusion with dependent atelectatic changes. 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 Diverticulosis without diverticulitis. Hiatal hernia. Posterior soft tissue hematomas to the left of the midline in the paraspinal soft tissues similar to that noted in the chest. Critical Value/emergent results were called by telephone at the time of interpretation on 12/27/2020 at 10:22 pm to [facility] , who verbally acknowledged these results. Electronically Signed   By: Inez Catalina M.D.   On: 12/27/2020 22:22   DG Foot Complete Left  Result Date: 12/27/2020 CLINICAL DATA:  Fall EXAM: LEFT FOOT - COMPLETE 3+ VIEW COMPARISON:  None. FINDINGS: There is no evidence of fracture or dislocation. There is no evidence of arthropathy or other focal bone abnormality. Soft tissues are unremarkable. IMPRESSION: Negative. Electronically Signed   By: Ulyses Jarred M.D.   On: 12/27/2020 22:26   DG Femur Min 2 Views Left  Result Date: 12/27/2020 CLINICAL DATA:  Fall EXAM: LEFT FEMUR 2 VIEWS COMPARISON:  None. FINDINGS: Femoral antegrade intramedullary nail with interlocking component. No fracture or dislocation. No perihardware lucency. IMPRESSION: No fracture or dislocation of the left femur. Electronically Signed   By: Ulyses Jarred M.D.   On: 12/27/2020 22:24    Subjective:   Patient was seen and examined 01/02/2021, 9:55 AM Patient stable today. No acute distress.  No issues overnight Stable for discharge.  Discharge Exam:    Vitals:   01/01/21 0452 01/01/21 1300  01/01/21 2018 01/02/21 0515  BP: 110/60 121/78 (!) 115/57 111/64  Pulse: 91 83 88 82  Resp:    18  Temp: 98.1 F (36.7 C) 97.9 F (36.6 C) 98.3 F (36.8 C) 97.9 F (36.6 C)  TempSrc: Oral Oral Oral   SpO2:  97% 100% 95%  Weight:      Height:        General: Pt lying comfortably in bed & appears in no obvious distress. Cardiovascular: S1 & S2 heard, RRR, S1/S2 +. No murmurs, rubs, gallops or clicks. No JVD or pedal edema. Respiratory: Clear to auscultation without wheezing, rhonchi or crackles. No increased work of breathing. Abdominal:  Non-distended, non-tender & soft. No organomegaly or masses appreciated. Normal bowel sounds heard. CNS: Alert and oriented. No focal deficits. Extremities: no edema, no cyanosis      The results of  significant diagnostics from this hospitalization (including imaging, microbiology, ancillary and laboratory) are listed below for reference.      Microbiology:   Recent Results (from the past 240 hour(s))  Urine Culture     Status: Abnormal   Collection Time: 12/27/20  7:40 PM   Specimen: Urine, Clean Catch  Result Value Ref Range Status   Specimen Description   Final    URINE, CLEAN CATCH Performed at [facility], [address], [place], [place] [address]    Special Requests   Final    NONE Performed at [facility], [address], [place], [place] [address]    Culture >=100,000 COLONIES/mL ESCHERICHIA COLI (A)  Final   Report Status 12/30/2020 FINAL  Final   Organism ID, Bacteria ESCHERICHIA COLI (A)  Final      Susceptibility   Escherichia coli - MIC*    AMPICILLIN <=2 SENSITIVE Sensitive     CEFAZOLIN <=4 SENSITIVE Sensitive     CEFEPIME <=0.12 SENSITIVE Sensitive     CEFTRIAXONE <=0.25 SENSITIVE Sensitive     CIPROFLOXACIN <=0.25 SENSITIVE Sensitive     GENTAMICIN <=1 SENSITIVE Sensitive     IMIPENEM <=0.25 SENSITIVE Sensitive     NITROFURANTOIN <=16 SENSITIVE Sensitive     TRIMETH/SULFA <=20 SENSITIVE Sensitive      AMPICILLIN/SULBACTAM <=2 SENSITIVE Sensitive     PIP/TAZO <=4 SENSITIVE Sensitive     * >=100,000 COLONIES/mL ESCHERICHIA COLI  Blood culture (routine x 2)     Status: None   Collection Time: 12/27/20  8:58 PM   Specimen: BLOOD  Result Value Ref Range Status   Specimen Description BLOOD LEFT ANTECUBITAL  Final   Special Requests   Final    BOTTLES DRAWN AEROBIC AND ANAEROBIC Blood Culture results may not be optimal due to an excessive volume of blood received in culture bottles   Culture   Final    NO GROWTH 5 DAYS Performed at [facility], [address] [place], [place] [address]    Report Status 01/01/2021 FINAL  Final  Blood culture (routine x 2)     Status: None   Collection Time: 12/27/20  8:58 PM   Specimen: BLOOD  Result Value Ref Range Status   Specimen Description BLOOD RIGHT ANTECUBITAL  Final   Special Requests   Final    BOTTLES DRAWN AEROBIC AND ANAEROBIC Blood Culture results may not be optimal due to an excessive volume of blood received in culture bottles   Culture   Final    NO GROWTH 5 DAYS Performed at [facility], [address], [place], [place] [address]    Report Status 01/01/2021 FINAL  Final  Resp Panel by RT-PCR (Flu A&B, Covid) Nasopharyngeal Swab     Status: None   Collection Time: 12/27/20  9:10 PM   Specimen: Nasopharyngeal Swab; Nasopharyngeal(NP) swabs in vial transport medium  Result Value Ref Range Status   SARS Coronavirus 2 by RT PCR NEGATIVE NEGATIVE Final    Comment: (NOTE) SARS-CoV-2 target nucleic acids are NOT DETECTED.  The SARS-CoV-2 RNA is generally detectable in upper respiratory specimens during the acute phase of infection. The lowest concentration of SARS-CoV-2 viral copies this assay can detect is 138 copies/mL. A negative result does not preclude SARS-Cov-2 infection and should not be used as the sole basis for treatment or other patient management decisions. A negative result may occur with  improper specimen  collection/handling, submission of specimen other than nasopharyngeal swab, presence of viral mutation(s) within the areas targeted  by this assay, and inadequate number of viral copies(<138 copies/mL). A negative result must be combined with clinical observations, patient history, and epidemiological information. The expected result is Negative.  Fact Sheet for Patients:  EntrepreneurPulse.com.au  Fact Sheet for Healthcare Providers:  IncredibleEmployment.be  This test is no t yet approved or cleared by the [place] FDA and  has been authorized for detection and/or diagnosis of SARS-CoV-2 by FDA under an Emergency Use Authorization (EUA). This EUA will remain  in effect (meaning this test can be used) for the duration of the COVID-19 declaration under Section 564(b)(1) of the Act, 21 U.S.C.section 360bbb-3(b)(1), unless the authorization is terminated  or revoked sooner.       Influenza A by PCR NEGATIVE NEGATIVE Final   Influenza B by PCR NEGATIVE NEGATIVE Final    Comment: (NOTE) The Xpert Xpress SARS-CoV-2/FLU/RSV plus assay is intended as an aid in the diagnosis of influenza from Nasopharyngeal swab specimens and should not be used as a sole basis for treatment. Nasal washings and aspirates are unacceptable for Xpert Xpress SARS-CoV-2/FLU/RSV testing.  Fact Sheet for Patients: EntrepreneurPulse.com.au  Fact Sheet for Healthcare Providers: IncredibleEmployment.be  This test is not yet approved or cleared by the [place] FDA and has been authorized for detection and/or diagnosis of SARS-CoV-2 by FDA under an Emergency Use Authorization (EUA). This EUA will remain in effect (meaning this test can be used) for the duration of the COVID-19 declaration under Section 564(b)(1) of the Act, 21 U.S.C. section 360bbb-3(b)(1), unless the authorization is terminated or revoked.  Performed at [facility], [address], [place], [place] [address]      Labs:   CBC: Recent Labs  Lab 12/27/20 1948 12/28/20 0503 12/29/20 0414 12/30/20 0601 12/31/20 0808 01/01/21 0651 01/02/21 0600  WBC 31.3*   < > 21.8* 15.3* 18.7* 14.4* 13.0*  NEUTROABS 27.5*  --   --   --   --   --   --   HGB 12.2   < > 9.9* 9.2* 9.4* 8.2* 8.2*  HCT 36.4   < > 30.2* 28.4* 28.8* 25.5* 25.6*  MCV 90.3   < > 91.2 91.3 90.3 92.7 91.8  PLT 1,180*   < > 849* 730* 713* 646* 686*   < > = values in this interval not displayed.   Basic Metabolic Panel: Recent Labs  Lab 12/28/20 0503 12/29/20 0414 12/30/20 0601 01/01/21 0611 01/02/21 0600  NA 135 134* 136 137 138  K 4.0 3.9 3.8 3.5 3.5  CL 101 99 100 99 101  CO2 21* 27 29 30 29   GLUCOSE 146* 123* 121* 104* 106*  BUN 21 31* 33* 27* 23  CREATININE 0.58 0.73 0.76 0.78 0.81  CALCIUM 8.5* 8.5* 8.4* 8.6* 8.5*   Liver Function Tests: Recent Labs  Lab 12/27/20 1948  AST 28  ALT 19  ALKPHOS 72  BILITOT 0.7  PROT 7.7  ALBUMIN 3.8   BNP (last 3 results) No results for input(s): BNP in the last 8760 hours. Cardiac Enzymes: No results for input(s): CKTOTAL, CKMB, CKMBINDEX, TROPONINI in the last 168 hours. CBG: Recent Labs  Lab 12/28/20 2222  GLUCAP 135*   Urinalysis    Component Value Date/Time   COLORURINE YELLOW 12/30/2020 2225   APPEARANCEUR CLOUDY (A) 12/30/2020 2225   LABSPEC 1.033 (H) 12/30/2020 2225   PHURINE 5.0 12/30/2020 2225   GLUCOSEU NEGATIVE 12/30/2020 2225   HGBUR SMALL (A) 12/30/2020 [place] NEGATIVE 12/30/2020 2225   Benjamin Stain  5 (A) 12/30/2020 2225   PROTEINUR 30 (A) 12/30/2020 2225   UROBILINOGEN >8.0 (H) 11/27/2010 1021   NITRITE NEGATIVE 12/30/2020 2225   LEUKOCYTESUR NEGATIVE 12/30/2020 2225         Time coordinating discharge: Over 45 minutes  SIGNED: Deatra James, MD, FACP, [facility],  Please use [email or web address] to Page If 7PM-7AM, please contact night-coverage [email or web address]  [facility] 01/02/2021, 9:55 AM

## 2021-01-03 ENCOUNTER — Encounter (HOSPITAL_COMMUNITY): Payer: Self-pay | Admitting: Family Medicine

## 2021-01-03 DIAGNOSIS — Z515 Encounter for palliative care: Secondary | ICD-10-CM

## 2021-01-03 DIAGNOSIS — R791 Abnormal coagulation profile: Secondary | ICD-10-CM | POA: Diagnosis not present

## 2021-01-03 DIAGNOSIS — Z7189 Other specified counseling: Secondary | ICD-10-CM | POA: Diagnosis not present

## 2021-01-03 LAB — BASIC METABOLIC PANEL
Anion gap: 7 (ref 5–15)
BUN: 19 mg/dL (ref 8–23)
CO2: 29 mmol/L (ref 22–32)
Calcium: 8.5 mg/dL — ABNORMAL LOW (ref 8.9–10.3)
Chloride: 103 mmol/L (ref 98–111)
Creatinine, Ser: 0.8 mg/dL (ref 0.44–1.00)
GFR, Estimated: >60 mL/min (ref >60)
Glucose, Bld: 105 mg/dL — ABNORMAL HIGH (ref 70–99)
Potassium: 3.5 mmol/L (ref 3.5–5.1)
Sodium: 139 mmol/L (ref 135–145)

## 2021-01-03 LAB — CBC
HCT: 26.2 % — ABNORMAL LOW (ref 36.0–46.0)
Hemoglobin: 8.5 g/dL — ABNORMAL LOW (ref 12.0–15.0)
MCH: 29.8 pg (ref 26.0–34.0)
MCHC: 32.4 g/dL (ref 30.0–36.0)
MCV: 91.9 fL (ref 80.0–100.0)
Platelets: 704 10*3/uL — ABNORMAL HIGH (ref 150–400)
RBC: 2.85 MIL/uL — ABNORMAL LOW (ref 3.87–5.11)
RDW: 14 % (ref 11.5–15.5)
WBC: 12.6 10*3/uL — ABNORMAL HIGH (ref 4.0–10.5)
nRBC: 0 % (ref 0.0–0.2)

## 2021-01-03 LAB — PROTIME-INR
INR: 2.5 — ABNORMAL HIGH (ref 0.8–1.2)
Prothrombin Time: 26.7 seconds — ABNORMAL HIGH (ref 11.4–15.2)

## 2021-01-03 LAB — SARS CORONAVIRUS 2 (TAT 6-24 HRS): SARS Coronavirus 2: NEGATIVE

## 2021-01-03 MED ORDER — WARFARIN SODIUM 5 MG PO TABS: 5.0000 mg | ORAL_TABLET | Freq: Once | ORAL | Status: DC

## 2021-01-03 NOTE — TOC Progression Note (Addendum)
Transition of Care [facility]) - Progression Note    Patient Details  Name: MARCEL PARIS MRN: [id number] Date of Birth: 1945-12-29  Transition of Care [facility]) CM/SW Contact  [name], LCSW Phone Number: 01/03/2021, 12:09 PM  Clinical Narrative:    Patient has made the decision to go to SNF. Patient agreeable to SNFs in [facility] referrals. Does not want to go outside of [facility].  Patient is not vaccinated for COVID and does not plan on receiving the vaccination.  Expected Discharge Plan: Home/Self Care Barriers to Discharge: Continued Medical Work up  Expected Discharge Plan and Services Expected Discharge Plan: Home/Self Care       Living arrangements for the past 2 months: Single Family Home Expected Discharge Date: 01/02/21                                     Social Determinants of Health (SDOH) Interventions    Readmission Risk Interventions No flowsheet data found.
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
## 2021-01-03 NOTE — Discharge Summary (Signed)
Physician Discharge Summary [facility]    Patient: Ana [name]                   Admit date: 12/27/2020   DOB: 1946/02/28             Discharge date:01/03/2021/11:50 AM [id number]                          PCP: Celene Squibb, MD  Disposition: SNF  Recommendations for Outpatient Follow-up:   Follow up: With PCP within 1 week. -Note to PCP, lower dose of Coumadin daily if possible INR to be checked daily, with a goal to be close to 2.0 (high risk for CVA -On this admission H&H finally stabilized at 8.2, no further bleeding, (Jehovah's Witness no agreement to platelet or blood product transfusion) Severe debility-currently bedbound-not motivated to get out of bed or move out of bed   Discharge Condition: Stable   Code Status:   Code Status: Full Code  Diet recommendation: Regular healthy diet   The patient was seen and examined, discussed extensively regarding her choice of SNF INR today therapeutic 2.5  Discussion-patient is cleared to be discharged to SNF today   Discharge Diagnoses:    Principal Problem:   Supratherapeutic INR Active Problems:   Essential hypertension   Seizure disorder (HCC)   Thrombocytosis   H/o CVA (cerebral vascular accident) ([place])   Leukocytosis   Intrathoracic hematoma   Intramuscular hematoma   Blood transfusion declined because patient is Jehovah's Witness   History of Present Illness/ Hospital Course Ana [name] Summary:    Brief Summary:- 75 y.o. female with medical history significant for hypertension, history of CVA, mechanical aortic valve on warfarin, and refusal of blood products admitted with back pain and bruising and found to have elevated INR above 10 in the setting of chronic Coumadin therapy for mechanical valve   ----------------------------------------------------------------------------------------------------------------------------------------------    1)Supratherapeutic INR with hematomas--- -H&H and hematoma  stable, INR 2.1   CT chest abdomen and pelvis shows left posterior chest just lateral to the spinous processes there are 2 large subacute hematomas and Posterior soft tissue hematomas to the left of the midline in the paraspinal soft tissues similar to that noted in the chest. -CT head without ICH -X-rays of the left femur, left tibia-fibula on the left foot without acute findings -INR remains greater than 10 despite 2 doses of oral vitamin K 9 hours apart -Discussed with pharmacy will give IV vitamin K   x 1 -Patient declines FFP as noted below in #3 -monitor H&H, q. Weekly,INR-to be checked daily for now -INR >10 on admission , now 1.4 >>> 1.2, 1.5 >>>2.1    2)Acute anemia - secondary to 1 above,  -Hemoglobin is down to 10.7 from 12.1 admission >> 9.9, 9.2>> 9.4 >> 8.2, 8.2       3)Social/Ethics--- patient is a full code, -Patient is a Jehovah witness and states categorically that she would Not accept FFP, PRBC or any other blood products even if she is bleeding out and even if she was at risk of dying -We will respect patient's religious views and honor her wishes   4)Persistent Leukocytosis and thrombocytosis- -- patient with longstanding history of polycythemia, outpatient follow-up with hematology as advised   5)Mechanical Valve- - S/p [employer] Jude AVR placed in 1998 -Hold Coumadin due to # 1 above -INR goal 2-3 -Resume Coumadin at lower dose with INR close to 2,  INR 2.1 today  -The risk and benefit of anticoagulation was discussed with patient and her son in detail Discussed with pharmacy, reinitiated Coumadin but on hold today due to drop in hemoglobin   6) UTI--urine culture positive for E. coli, pansensitive, previously on Macrobid,  Continue p.o. antibiotics of ciprofloxacin Leukocytosis but afebrile WBC 21.8, 15.3 >> 18.7, 14.4 oday   7) hyperglycemia--- no prior history of diabetes, A1c: 5.9   8)HTN--amlodipine 5 mg daily   9) severe debility-due to previous strokes, 1  eye blindness -Per patient's son post previous stroke patient is now basically chair bound, bedbound Has trouble with ambulating, needs assist with most ADLs -Family providing as much as Possible at home -Recommended SNF,         Disposition: The patient is from: Home              Anticipated d/c is to: Skilled nursing facility   Code Status :  -  Code Status: Full Code          Discharge Instructions:   Discharge Instructions     Activity as tolerated - No restrictions   Complete by: As directed    Diet - low sodium heart healthy   Complete by: As directed    Discharge instructions   Complete by: As directed    Very close monitoring of INR, if possible check daily, current Coumadin dose 5 mg recommending INR to be close to 2.0... To avoid any further bleeding, and complication CBC within 5-7 days..  to be Reviewed by PCP   Increase activity slowly   Complete by: As directed         Medication List     STOP taking these medications    cephALEXin 500 MG capsule Commonly known as: KEFLEX   enoxaparin 30 MG/0.3ML injection Commonly known as: LOVENOX   ibuprofen 800 MG tablet Commonly known as: ADVIL       TAKE these medications    acetaminophen 500 MG tablet Commonly known as: TYLENOL Take 1 tablet (500 mg total) by mouth every 8 (eight) hours as needed for mild pain or moderate pain. What changed: Another medication with the same name was added. Make sure you understand how and when to take each.   acetaminophen 325 MG tablet Commonly known as: TYLENOL Take 2 tablets (650 mg total) by mouth every 6 (six) hours as needed for mild pain (or Fever >/= 101). What changed: You were already taking a medication with the same name, and this prescription was added. Make sure you understand how and when to take each.   amLODipine 10 MG tablet Commonly known as: NORVASC Take 10 mg by mouth every evening.   B-12 Compliance Injection 1000 MCG/ML Kit Generic  drug: Cyanocobalamin Inject 1 mL as directed See admin instructions. EVERY 4 DAYS   ciprofloxacin 500 MG tablet Commonly known as: CIPRO Take 1 tablet (500 mg total) by mouth 2 (two) times daily for 5 days.   melatonin 3 MG Tabs tablet Take 1 tablet (3 mg total) by mouth at bedtime as needed.   pantoprazole 40 MG tablet Commonly known as: PROTONIX Take 1 tablet (40 mg total) by mouth daily.   warfarin 5 MG tablet Commonly known as: COUMADIN Take 1 tablet (5 mg total) by mouth one time only at 4 PM. What changed:  medication strength how much to take when to take this additional instructions Another medication with the same name was removed. Continue taking this medication, and  follow the directions you see here.        Allergies  Allergen Reactions   Blood-Group Specific Substance     NO BLOOD PRODUCTS     Procedures /Studies:   DG Tibia/Fibula Left  Result Date: 12/27/2020 CLINICAL DATA:  Fall EXAM: LEFT TIBIA AND FIBULA - 2 VIEW COMPARISON:  None. FINDINGS: Mild femorotibial chondrocalcinosis. No acute fracture or dislocation. Joint spaces maintained. IMPRESSION: No acute fracture or dislocation of the left tibia or fibula. Electronically Signed   By: Ulyses Jarred M.D.   On: 12/27/2020 22:25   CT HEAD WO CONTRAST (5MM)  Result Date: 12/27/2020 CLINICAL DATA:  Pt. States they have been having back pain for awhile but can't say for how long. Pt. Has unexplained bruising Head trauma, coagulopathy (Age 2-64y.). History of intra-ocular prosthesis insertion. EXAM: CT HEAD WITHOUT CONTRAST TECHNIQUE: Contiguous axial images were obtained from the base of the skull through the vertex without intravenous contrast. COMPARISON:  CT head 03/04/2020 FINDINGS: Brain: Cerebral ventricle sizes are concordant with the degree of cerebral volume loss. Patchy and confluent areas of decreased attenuation are noted throughout the deep and periventricular white matter of the cerebral  hemispheres bilaterally, compatible with chronic microvascular ischemic disease. Chronic right cerebellar infarction. No evidence of large-territorial acute infarction. No parenchymal hemorrhage. No mass lesion. No extra-axial collection. No mass effect or midline shift. No hydrocephalus. Basilar cisterns are patent. Vascular: No hyperdense vessel. Atherosclerotic calcifications are present within the cavernous internal carotid arteries. Skull: No acute fracture or focal lesion. Sinuses/Orbits: Paranasal sinuses and mastoid air cells are clear. Left orbital surgical changes with interval removal of a left orbital prosthesis. 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 Otherwise the right orbit are unremarkable. Other: None. IMPRESSION: No acute intracranial abnormality. Electronically Signed   By: Iven Finn M.D.   On: 12/27/2020 22:20   CT CHEST ABDOMEN PELVIS W CONTRAST  Result Date: 12/27/2020 CLINICAL DATA:  Generalized bruising without definitive traumatic history, initial encounter EXAM: CT CHEST, ABDOMEN, AND PELVIS WITH CONTRAST TECHNIQUE: Multidetector CT imaging of the chest, abdomen and pelvis was performed following the standard protocol during bolus administration of intravenous contrast. CONTRAST:  29m OMNIPAQUE IOHEXOL 350 MG/ML SOLN COMPARISON:  04/23/2017 FINDINGS: CT CHEST FINDINGS Cardiovascular: Thoracic aorta demonstrates atherosclerotic calcifications. Mild ectasia of the ascending aorta is noted without true aneurysmal dilatation. Coronary calcifications are seen. No dissection is noted. No cardiac enlargement is seen. The pulmonary artery as visualized is within normal limits. Mediastinum/Nodes: Thoracic inlet is within normal limits. No sizable hilar or mediastinal adenopathy is noted. Small sliding-type hiatal hernia is noted. Lungs/Pleura: Mild dependent atelectatic changes are seen. Small left-sided pleural effusion is noted. No pneumothorax is seen.  Musculoskeletal: No acute rib abnormality is noted. No compression deformity is noted. In the left posterior chest wall just lateral to the spinous processes there are multifocal soft tissue hematomas identified with hematocrit levels within. Overlying soft tissue swelling is noted consistent with the given clinical history of bruising. No area of active extravasation is identified to suggest acute hemorrhage. Given the hematocrit level these changes are likely subacute in nature. The largest of these measures 4.9 by 3.0 cm in transverse and AP dimensions. It extends for 11 cm in a craniocaudad projection CT ABDOMEN PELVIS FINDINGS Hepatobiliary: No focal liver abnormality is seen. No gallstones, gallbladder wall thickening, or biliary dilatation. Pancreas: Unremarkable. No pancreatic ductal dilatation or surrounding inflammatory changes. Spleen: Spleen is within normal limits. A heavily calcified splenic artery aneurysm  is noted which measures approximately 13 mm stable in appearance from the prior exam. Adrenals/Urinary Tract: Adrenal glands are within normal limits. Kidneys demonstrate a normal enhancement pattern. No renal calculi or obstructive changes are seen. Normal excretion of contrast is noted. The ureters are unremarkable. The bladder is partially distended. Stomach/Bowel: Scattered diverticular change of the colon is noted without evidence of diverticulitis. The appendix is well visualized and within normal limits. Small bowel and stomach are unremarkable with the exception of the previously mentioned hiatal hernia. Vascular/Lymphatic: Aortic atherosclerosis. No enlarged abdominal or pelvic lymph nodes. Reproductive: Status post hysterectomy. No adnexal masses. Other: No abdominal wall hernia or abnormality. No abdominopelvic ascites. Musculoskeletal: Postsurgical changes in the proximal left femur are seen. Degenerative lumbar spine changes are noted. IMPRESSION: CT of the chest: No acute rib  abnormality is noted. In the left posterior chest just lateral to the spinous processes there are 2 large hematomas identified with hematocrit levels indicating a subacute nature. No active extravasation of hemorrhage is noted. No associated bony abnormality is seen. This likely represents spontaneous hemorrhage from ovary anticoagulation given the elevated prothrombin time and INR. Small left pleural effusion with dependent atelectatic changes. 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 Diverticulosis without diverticulitis. Hiatal hernia. Posterior soft tissue hematomas to the left of the midline in the paraspinal soft tissues similar to that noted in the chest. Critical Value/emergent results were called by telephone at the time of interpretation on 12/27/2020 at 10:22 pm to [facility] , who verbally acknowledged these results. Electronically Signed   By: Inez Catalina M.D.   On: 12/27/2020 22:22   DG Foot Complete Left  Result Date: 12/27/2020 CLINICAL DATA:  Fall EXAM: LEFT FOOT - COMPLETE 3+ VIEW COMPARISON:  None. FINDINGS: There is no evidence of fracture or dislocation. There is no evidence of arthropathy or other focal bone abnormality. Soft tissues are unremarkable. IMPRESSION: Negative. Electronically Signed   By: Ulyses Jarred M.D.   On: 12/27/2020 22:26   DG Femur Min 2 Views Left  Result Date: 12/27/2020 CLINICAL DATA:  Fall EXAM: LEFT FEMUR 2 VIEWS COMPARISON:  None. FINDINGS: Femoral antegrade intramedullary nail with interlocking component. No fracture or dislocation. No perihardware lucency. IMPRESSION: No fracture or dislocation of the left femur. Electronically Signed   By: Ulyses Jarred M.D.   On: 12/27/2020 22:24    Subjective:   Patient was seen and examined 01/03/2021, 11:50 AM Patient stable today. No acute distress.  No issues overnight Stable for discharge.  Discharge Exam:    Vitals:   01/02/21 1500 01/02/21 2155  01/03/21 0440 01/03/21 0836  BP: 113/69 126/65 127/66 (!) 143/72  Pulse: 86 87 79   Resp: _0 Temp: 97.9 F (36.6 C) 98.2 F (36.8 C) 98.3 F (36.8 C)   TempSrc: Oral     SpO2: 93% 96% 96%   Weight:      Height:        General: Pt lying comfortably in bed & appears in no obvious distress. Cardiovascular: S1 & S2 heard, RRR, S1/S2 +. No murmurs, rubs, gallops or clicks. No JVD or pedal edema. Respiratory: Clear to auscultation without wheezing, rhonchi or crackles. No increased work of breathing. Abdominal:  Non-distended, non-tender & soft. No organomegaly or masses appreciated. Normal bowel sounds heard. CNS: Alert and oriented. No focal deficits. Extremities: no edema, no cyanosis      The results of significant diagnostics  from this hospitalization (including imaging, microbiology, ancillary and laboratory) are listed below for reference.      Microbiology:   Recent Results (from the past 240 hour(s))  Urine Culture     Status: Abnormal   Collection Time: 12/27/20  7:40 PM   Specimen: Urine, Clean Catch  Result Value Ref Range Status   Specimen Description   Final    URINE, CLEAN CATCH Performed at [facility], [address], [place], [place] [address]    Special Requests   Final    NONE Performed at [facility], [address], [place], [place] [address]    Culture >=100,000 COLONIES/mL ESCHERICHIA COLI (A)  Final   Report Status 12/30/2020 FINAL  Final   Organism ID, Bacteria ESCHERICHIA COLI (A)  Final      Susceptibility   Escherichia coli - MIC*    AMPICILLIN <=2 SENSITIVE Sensitive     CEFAZOLIN <=4 SENSITIVE Sensitive     CEFEPIME <=0.12 SENSITIVE Sensitive     CEFTRIAXONE <=0.25 SENSITIVE Sensitive     CIPROFLOXACIN <=0.25 SENSITIVE Sensitive     GENTAMICIN <=1 SENSITIVE Sensitive     IMIPENEM <=0.25 SENSITIVE Sensitive     NITROFURANTOIN <=16 SENSITIVE Sensitive     TRIMETH/SULFA <=20 SENSITIVE Sensitive     AMPICILLIN/SULBACTAM <=2  SENSITIVE Sensitive     PIP/TAZO <=4 SENSITIVE Sensitive     * >=100,000 COLONIES/mL ESCHERICHIA COLI  Blood culture (routine x 2)     Status: None   Collection Time: 12/27/20  8:58 PM   Specimen: BLOOD  Result Value Ref Range Status   Specimen Description BLOOD LEFT ANTECUBITAL  Final   Special Requests   Final    BOTTLES DRAWN AEROBIC AND ANAEROBIC Blood Culture results may not be optimal due to an excessive volume of blood received in culture bottles   Culture   Final    NO GROWTH 5 DAYS Performed at [facility], [address], [place], [place] [address]    Report Status 01/01/2021 FINAL  Final  Blood culture (routine x 2)     Status: None   Collection Time: 12/27/20  8:58 PM   Specimen: BLOOD  Result Value Ref Range Status   Specimen Description BLOOD RIGHT ANTECUBITAL  Final   Special Requests   Final    BOTTLES DRAWN AEROBIC AND ANAEROBIC Blood Culture results may not be optimal due to an excessive volume of blood received in culture bottles   Culture   Final    NO GROWTH 5 DAYS Performed at [facility], [address] [place], [place] [address]    Report Status 01/01/2021 FINAL  Final  Resp Panel by RT-PCR (Flu A&B, Covid) Nasopharyngeal Swab     Status: None   Collection Time: 12/27/20  9:10 PM   Specimen: Nasopharyngeal Swab; Nasopharyngeal(NP) swabs in vial transport medium  Result Value Ref Range Status   SARS Coronavirus 2 by RT PCR NEGATIVE NEGATIVE Final    Comment: (NOTE) SARS-CoV-2 target nucleic acids are NOT DETECTED.  The SARS-CoV-2 RNA is generally detectable in upper respiratory specimens during the acute phase of infection. The lowest concentration of SARS-CoV-2 viral copies this assay can detect is 138 copies/mL. A negative result does not preclude SARS-Cov-2 infection and should not be used as the sole basis for treatment or other patient management decisions. A negative result may occur with  improper specimen collection/handling, submission of  specimen other than nasopharyngeal swab, presence of viral mutation(s) within the areas targeted by this  assay, and inadequate number of viral copies(<138 copies/mL). A negative result must be combined with clinical observations, patient history, and epidemiological information. The expected result is Negative.  Fact Sheet for Patients:  EntrepreneurPulse.com.au  Fact Sheet for Healthcare Providers:  IncredibleEmployment.be  This test is no t yet approved or cleared by the [place] FDA and  has been authorized for detection and/or diagnosis of SARS-CoV-2 by FDA under an Emergency Use Authorization (EUA). This EUA will remain  in effect (meaning this test can be used) for the duration of the COVID-19 declaration under Section 564(b)(1) of the Act, 21 U.S.C.section 360bbb-3(b)(1), unless the authorization is terminated  or revoked sooner.       Influenza A by PCR NEGATIVE NEGATIVE Final   Influenza B by PCR NEGATIVE NEGATIVE Final    Comment: (NOTE) The Xpert Xpress SARS-CoV-2/FLU/RSV plus assay is intended as an aid in the diagnosis of influenza from Nasopharyngeal swab specimens and should not be used as a sole basis for treatment. Nasal washings and aspirates are unacceptable for Xpert Xpress SARS-CoV-2/FLU/RSV testing.  Fact Sheet for Patients: EntrepreneurPulse.com.au  Fact Sheet for Healthcare Providers: IncredibleEmployment.be  This test is not yet approved or cleared by the [place] FDA and has been authorized for detection and/or diagnosis of SARS-CoV-2 by FDA under an Emergency Use Authorization (EUA). This EUA will remain in effect (meaning this test can be used) for the duration of the COVID-19 declaration under Section 564(b)(1) of the Act, 21 U.S.C. section 360bbb-3(b)(1), unless the authorization is terminated or revoked.  Performed at [facility], [address],  [place], [place] [address]   SARS CORONAVIRUS 2 (TAT 6-24 HRS) Nasopharyngeal Nasopharyngeal Swab     Status: None   Collection Time: 01/02/21  6:00 PM   Specimen: Nasopharyngeal Swab  Result Value Ref Range Status   SARS Coronavirus 2 NEGATIVE NEGATIVE Final    Comment: (NOTE) SARS-CoV-2 target nucleic acids are NOT DETECTED.  The SARS-CoV-2 RNA is generally detectable in upper and lower respiratory specimens during the acute phase of infection. Negative results do not preclude SARS-CoV-2 infection, do not rule out co-infections with other pathogens, and should not be used as the sole basis for treatment or other patient management decisions. Negative results must be combined with clinical observations, patient history, and epidemiological information. The expected result is Negative.  Fact Sheet for Patients: SugarRoll.be  Fact Sheet for Healthcare Providers: https://www.woods-mathews.com/  This test is not yet approved or cleared by the [place] FDA and  has been authorized for detection and/or diagnosis of SARS-CoV-2 by FDA under an Emergency Use Authorization (EUA). This EUA will remain  in effect (meaning this test can be used) for the duration of the COVID-19 declaration under Se ction 564(b)(1) of the Act, 21 U.S.C. section 360bbb-3(b)(1), unless the authorization is terminated or revoked sooner.  Performed at [facility], [place] [address], [place], [place] [address]      Labs:   CBC: Recent Labs  Lab 12/27/20 1948 12/28/20 0503 12/30/20 0601 12/31/20 0808 01/01/21 0651 01/02/21 0600 01/03/21 0639  WBC 31.3*   < > 15.3* 18.7* 14.4* 13.0* 12.6*  NEUTROABS 27.5*  --   --   --   --   --   --   HGB 12.2   < > 9.2* 9.4* 8.2* 8.2* 8.5*  HCT 36.4   < > 28.4* 28.8* 25.5* 25.6* 26.2*  MCV 90.3   < > 91.3 90.3 92.7 91.8 91.9  PLT 1,180*   < >  730* 713* 646* 686* 704*   < > = values in this interval not displayed.    Basic Metabolic Panel: Recent Labs  Lab 12/29/20 0414 12/30/20 0601 01/01/21 0611 01/02/21 0600 01/03/21 0639  NA 134* 136 137 138 139  K 3.9 3.8 3.5 3.5 3.5  CL 99 100 99 101 103  CO2 _0 GLUCOSE 123* 121* 104* 106* 105*  BUN 31* 33* 27* 23 19  CREATININE 0.73 0.76 0.78 0.81 0.80  CALCIUM 8.5* 8.4* 8.6* 8.5* 8.5*   Liver Function Tests: Recent Labs  Lab 12/27/20 1948  AST 28  ALT 19  ALKPHOS 72  BILITOT 0.7  PROT 7.7  ALBUMIN 3.8   BNP (last 3 results) No results for input(s): BNP in the last 8760 hours. Cardiac Enzymes: No results for input(s): CKTOTAL, CKMB, CKMBINDEX, TROPONINI in the last 168 hours. CBG: Recent Labs  Lab 12/28/20 2222  GLUCAP 135*   Urinalysis    Component Value Date/Time   COLORURINE YELLOW 12/30/2020 2225   APPEARANCEUR CLOUDY (A) 12/30/2020 2225   LABSPEC 1.033 (H) 12/30/2020 2225   PHURINE 5.0 12/30/2020 2225   GLUCOSEU NEGATIVE 12/30/2020 2225   HGBUR SMALL (A) 12/30/2020 2225   BILIRUBINUR NEGATIVE 12/30/2020 2225   KETONESUR 5 (A) 12/30/2020 2225   PROTEINUR 30 (A) 12/30/2020 2225   UROBILINOGEN >8.0 (H) 11/27/2010 1021   NITRITE NEGATIVE 12/30/2020 2225   LEUKOCYTESUR NEGATIVE 12/30/2020 2225         Time coordinating discharge: Over 45 minutes  SIGNED: Deatra James, MD, FACP, [facility]. [facility],  Please use [email or web address] to Page If 7PM-7AM, please contact night-coverage [email or web address] [email or web address] [facility] 01/03/2021, 11:50 AM

## 2021-01-03 NOTE — Care Management Important Message (Signed)
Important Message  Patient Details  Name: Ana [name] MRN: [id number] Date of Birth: 08-Mar-1946   Medicare Important Message Given:  Yes     Tommy Medal 01/03/2021, 1:26 PM

## 2021-01-03 NOTE — NC FL2 (Signed)
[place] LEVEL OF CARE SCREENING TOOL     IDENTIFICATION  Patient Name: Ana [name] Birthdate: 04-05-1946 Sex: female Admission Date (Current Location): 12/27/2020  [facility] and [place] Number:  [employer] and Address:  [place] [address] [place]      Provider Number: [id number]  Attending Physician Name and Address:  Deatra James, MD  Relative Name and Phone Number:  Cyncere, Morea)   [phone]    Current Level of Care: Hospital Recommended Level of Care: [place] Prior Approval Number:    Date Approved/Denied:   PASRR Number: [id number]  Discharge Plan: SNF    Current Diagnoses: Patient Active Problem List   Diagnosis Date Noted   Intrathoracic hematoma 12/28/2020   Intramuscular hematoma 12/28/2020   Blood transfusion declined because patient is Jehovah's Witness 12/28/2020   Acute cystitis with hematuria    Supratherapeutic INR 12/27/2020   Closed comminuted intertrochanteric fracture of proximal femur, left, initial encounter ([place]) 03/04/2020   H/o CVA (cerebral vascular accident) ([place]) 03/04/2020   Leukocytosis 03/04/2020   Fall at home, initial encounter 03/04/2020   Hyperglycemia 03/04/2020   Closed left femoral fracture ([place]) 03/04/2020   Elevated vitamin B12 level 04/16/2017   Cholelithiasis 01/29/2017   Acute cholecystitis 01/29/2017   Polycythemia 01/29/2017   Thrombocytosis 01/29/2017   Chronic anticoagulation 11/30/2016   Encounter for screening colonoscopy 11/30/2016   Iron deficiency 11/10/2016   Displaced comminuted fracture of right patella 07/16/2014   Patella fracture 07/16/2014   CHEST PAIN-PRECORDIAL 09/24/2009   Hyperlipidemia 11/16/2008   DEPRESSION 11/16/2008   Essential hypertension 11/16/2008   AORTIC INSUFFICIENCY 11/16/2008   IBS 11/16/2008   ARTHRITIS 11/16/2008   Seizure disorder ([place]) 11/16/2008   CHRONIC FATIGUE SYNDROME 11/16/2008    THYROID DISEASE, HX OF 11/16/2008    Orientation RESPIRATION BLADDER Height & Weight     Self, Time, Situation, Place  Normal Continent Weight: 222 lb 10.6 oz (101 kg) Height:  '5\' 6"'$  (167.6 cm)  BEHAVIORAL SYMPTOMS/MOOD NEUROLOGICAL BOWEL NUTRITION STATUS      Continent Diet (low sodium/heart healthy)  AMBULATORY STATUS COMMUNICATION OF NEEDS Skin   Limited Assist Verbally Normal                       Personal Care Assistance Level of Assistance  Bathing, Dressing, Feeding Bathing Assistance: Limited assistance Feeding assistance: Independent Dressing Assistance: Limited assistance     Functional Limitations Info  Sight, Hearing, Speech Sight Info: Adequate Hearing Info: Adequate Speech Info: Adequate    SPECIAL CARE FACTORS FREQUENCY  OT (By licensed OT), PT (By licensed PT)     PT Frequency: 5x/week OT Frequency: 3x/week            Contractures Contractures Info: Not present    Additional Factors Info  Code Status, Allergies Code Status Info: Full Code Allergies Info: Blood-group specific substance, Blood products refusal           Current Medications (01/03/2021):  This is the current hospital active medication list Current Facility-Administered Medications  Medication Dose Route Frequency Provider Last Rate Last Admin   acetaminophen (TYLENOL) tablet 650 mg  650 mg Oral Q6H PRN Opyd, Ilene Qua, MD       Or   acetaminophen (TYLENOL) suppository 650 mg  650 mg Rectal Q6H PRN Opyd, Ilene Qua, MD       amLODipine (NORVASC) tablet 2.5 mg  2.5 mg Oral Daily Shahmehdi, Seyed  A, MD   2.5 mg at 01/03/21 0836   HYDROcodone-acetaminophen (NORCO/VICODIN) 5-325 MG per tablet 1 tablet  1 tablet Oral Q4H PRN Opyd, Ilene Qua, MD   1 tablet at 12/30/20 1620   labetalol (NORMODYNE) injection 10 mg  10 mg Intravenous Q4H PRN Opyd, Ilene Qua, MD       melatonin tablet 3 mg  3 mg Oral QHS PRN Opyd, Ilene Qua, MD       morphine 2 MG/ML injection 2 mg  2 mg Intravenous Q4H  PRN Shahmehdi, Seyed A, MD       ondansetron (ZOFRAN) tablet 4 mg  4 mg Oral Q6H PRN Opyd, Ilene Qua, MD       Or   ondansetron (ZOFRAN) injection 4 mg  4 mg Intravenous Q6H PRN Opyd, Ilene Qua, MD       pantoprazole (PROTONIX) EC tablet 40 mg  40 mg Oral Daily Opyd, Ilene Qua, MD   40 mg at 01/03/21 [id number]   senna-docusate (Senokot-S) tablet 1 tablet  1 tablet Oral QHS Deatra James, MD   1 tablet at 01/02/21 2123   Warfarin - Pharmacist Dosing Inpatient   Does not apply q1600 Deatra James, MD   Given at 01/02/21 1730     Discharge Medications: Please see discharge summary for a list of discharge medications.  Relevant Imaging Results:  Relevant Lab Results:   Additional Information SSN [id number]  [name], LCSW

## 2021-01-04 DIAGNOSIS — Z515 Encounter for palliative care: Secondary | ICD-10-CM | POA: Diagnosis not present

## 2021-01-04 DIAGNOSIS — Z7189 Other specified counseling: Secondary | ICD-10-CM | POA: Diagnosis not present

## 2021-01-04 DIAGNOSIS — R791 Abnormal coagulation profile: Secondary | ICD-10-CM | POA: Diagnosis not present

## 2021-01-04 LAB — BASIC METABOLIC PANEL
Anion gap: 7 (ref 5–15)
BUN: 17 mg/dL (ref 8–23)
CO2: 30 mmol/L (ref 22–32)
Calcium: 8.4 mg/dL — ABNORMAL LOW (ref 8.9–10.3)
Chloride: 104 mmol/L (ref 98–111)
Creatinine, Ser: 0.7 mg/dL (ref 0.44–1.00)
GFR, Estimated: >60 mL/min (ref >60)
Glucose, Bld: 103 mg/dL — ABNORMAL HIGH (ref 70–99)
Potassium: 3.5 mmol/L (ref 3.5–5.1)
Sodium: 141 mmol/L (ref 135–145)

## 2021-01-04 LAB — PROTIME-INR
INR: 2.4 — ABNORMAL HIGH (ref 0.8–1.2)
Prothrombin Time: 25.9 seconds — ABNORMAL HIGH (ref 11.4–15.2)

## 2021-01-04 MED ORDER — DOCUSATE SODIUM 50 MG/5ML PO LIQD
200.0000 mg | Freq: Every day | ORAL | Status: DC
  Administered 2021-01-04: 200 mg via ORAL
  Filled 2021-01-04 (×5): qty 20

## 2021-01-04 MED ORDER — POLYETHYLENE GLYCOL 3350 17 G PO PACK
17.0000 g | PACK | Freq: Every day | ORAL | Status: DC
  Administered 2021-01-04: 17 g via ORAL
  Filled 2021-01-04 (×2): qty 1

## 2021-01-04 MED ORDER — WARFARIN SODIUM 2.5 MG PO TABS
2.5000 mg | ORAL_TABLET | Freq: Once | ORAL | Status: AC
  Administered 2021-01-04: 2.5 mg via ORAL
  Filled 2021-01-04: qty 1
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
## 2021-01-04 NOTE — Progress Notes (Signed)
Report has been given to [place].

## 2021-01-04 NOTE — TOC Transition Note (Signed)
Transition of Care [facility]) - CM/SW Discharge Note   Patient Details  Name: Ana [name] MRN: [id number] Date of Birth: 02-25-1946  [facility] CM/SW Contact:  [name], LCSW Phone Number: 01/04/2021, 10:56 AM   Clinical Narrative:    Discharge clinicals sent to facility. RN to call report. Patient go to room 413. EMS transport arranged.     Final next level of care: Skilled Nursing Facility Barriers to Discharge: No Barriers Identified   Patient Goals and CMS Choice Patient states their goals for this hospitalization and ongoing recovery are:: return home      Discharge Placement              Patient chooses bed at: [facility] Patient to be transferred to facility by: [place] Name of family member notified: Son (VM left) Patient and family notified of of transfer: 01/04/21  Discharge Plan and Services                                     Social Determinants of Health (SDOH) Interventions     Readmission Risk Interventions No flowsheet data found.

## 2021-01-04 NOTE — Discharge Summary (Signed)
Physician Discharge Summary [facility]    Patient: Ana [name]                   Admit date: 12/27/2020   DOB: [birth year]             Discharge date:01/04/2021/11:28 AM OFB:[id number]                          PCP: Celene Squibb, MD  Disposition: SNF  Recommendations for Outpatient Follow-up:   Follow up: With PCP within 1 week. -Note to PCP, lower dose of Coumadin daily if possible INR to be checked daily, with a goal to be close to 2.0 (high risk for CVA -On this admission H&H finally stabilized at 8.2, no further bleeding, (Jehovah's Witness no agreement to platelet or blood product transfusion) Severe debility-currently bedbound-not motivated to get out of bed or move out of bed   Discharge Condition: Stable   Code Status:   Code Status: Full Code  Diet recommendation: Regular healthy diet   The patient was seen and examined this morning.  Stable cleared to be discharged to SNF No changes to this discharge summary -INR therapeutic today 2.4   Discussion - to SNF today   Discharge Diagnoses:    Principal Problem:   Supratherapeutic INR Active Problems:   Essential hypertension   Seizure disorder (HCC)   Thrombocytosis   H/o CVA (cerebral vascular accident) ([place])   Leukocytosis   Intrathoracic hematoma   Intramuscular hematoma   Blood transfusion declined because patient is Jehovah's Witness   History of Present Illness/ Hospital Course Ana [name] Summary:    Brief Summary:- 75 y.o. female with medical history significant for hypertension, history of CVA, mechanical aortic valve on warfarin, and refusal of blood products admitted with back pain and bruising and found to have elevated INR above 10 in the setting of chronic Coumadin therapy for mechanical valve   ----------------------------------------------------------------------------------------------------------------------------------------------    1)Supratherapeutic INR with hematomas--- -H&H  and hematoma stable, INR 2.1   CT chest abdomen and pelvis shows left posterior chest just lateral to the spinous processes there are 2 large subacute hematomas and Posterior soft tissue hematomas to the left of the midline in the paraspinal soft tissues similar to that noted in the chest. -CT head without ICH -X-rays of the left femur, left tibia-fibula on the left foot without acute findings -INR remains greater than 10 despite 2 doses of oral vitamin K 9 hours apart -Discussed with pharmacy will give IV vitamin K   x 1 -Patient declines FFP as noted below in #3 -monitor H&H, q. Weekly,INR-to be checked daily for now -INR >10 on admission , now 1.4 >>> 1.2, 1.5 >>>2.1    2)Acute anemia - secondary to 1 above,  -Hemoglobin is down to 10.7 from 12.1 admission >> 9.9, 9.2>> 9.4 >> 8.2, 8.2       3)Social/Ethics--- patient is a full code, -Patient is a Jehovah witness and states categorically that she would Not accept FFP, PRBC or any other blood products even if she is bleeding out and even if she was at risk of dying -We will respect patient's religious views and honor her wishes   4)Persistent Leukocytosis and thrombocytosis- -- patient with longstanding history of polycythemia, outpatient follow-up with hematology as advised   5)Mechanical Valve- - S/p St Jude AVR placed in 1998 -Hold Coumadin due to # 1 above -INR goal 2-3 -Resume Coumadin at lower  dose with INR close to 2, INR 2.1 today  -The risk and benefit of anticoagulation was discussed with patient and her son in detail Discussed with pharmacy, reinitiated Coumadin but on hold today due to drop in hemoglobin   6) UTI--urine culture positive for E. coli, pansensitive, previously on Macrobid,  Continue p.o. antibiotics of ciprofloxacin Leukocytosis but afebrile WBC 21.8, 15.3 >> 18.7, 14.4 oday   7) hyperglycemia--- no prior history of diabetes, A1c: 5.9   8)HTN--amlodipine 5 mg daily   9) severe debility-due to  previous strokes, 1 eye blindness -Per patient's son post previous stroke patient is now basically chair bound, bedbound Has trouble with ambulating, needs assist with most ADLs -Family providing as much as Possible at home -Recommended SNF,         Disposition: The patient is from: Home              Anticipated d/c is to: Skilled nursing facility   Code Status :  -  Code Status: Full Code          Discharge Instructions:   Discharge Instructions     Activity as tolerated - No restrictions   Complete by: As directed    Diet - low sodium heart healthy   Complete by: As directed    Discharge instructions   Complete by: As directed    Very close monitoring of INR, if possible check daily, current Coumadin dose 5 mg recommending INR to be close to 2.0... To avoid any further bleeding, and complication CBC within 5-7 days..  to be Reviewed by PCP   Increase activity slowly   Complete by: As directed         Medication List     STOP taking these medications    cephALEXin 500 MG capsule Commonly known as: KEFLEX   enoxaparin 30 MG/0.3ML injection Commonly known as: LOVENOX   ibuprofen 800 MG tablet Commonly known as: ADVIL       TAKE these medications    acetaminophen 500 MG tablet Commonly known as: TYLENOL Take 1 tablet (500 mg total) by mouth every 8 (eight) hours as needed for mild pain or moderate pain. What changed: Another medication with the same name was added. Make sure you understand how and when to take each.   acetaminophen 325 MG tablet Commonly known as: TYLENOL Take 2 tablets (650 mg total) by mouth every 6 (six) hours as needed for mild pain (or Fever >/= 101). What changed: You were already taking a medication with the same name, and this prescription was added. Make sure you understand how and when to take each.   amLODipine 10 MG tablet Commonly known as: NORVASC Take 10 mg by mouth every evening.   B-12 Compliance Injection 1000  MCG/ML Kit Generic drug: Cyanocobalamin Inject 1 mL as directed See admin instructions. EVERY 4 DAYS   melatonin 3 MG Tabs tablet Take 1 tablet (3 mg total) by mouth at bedtime as needed.   pantoprazole 40 MG tablet Commonly known as: PROTONIX Take 1 tablet (40 mg total) by mouth daily.   warfarin 5 MG tablet Commonly known as: COUMADIN Take 1 tablet (5 mg total) by mouth one time only at 4 PM. What changed:  medication strength how much to take when to take this additional instructions   warfarin 2.5 MG tablet Commonly known as: COUMADIN Take 1 tablet (2.5 mg total) by mouth one time only at 4 PM. What changed:  medication strength how much to  take when to take this additional instructions         Contact information for after-discharge care     [facility] .   Service: Skilled Nursing Contact information: [address] [address] [phone]                    Allergies  Allergen Reactions   Blood-Group Specific Substance     NO BLOOD PRODUCTS     Procedures /Studies:   DG Tibia/Fibula Left  Result Date: 12/27/2020 CLINICAL DATA:  Fall EXAM: LEFT TIBIA AND FIBULA - 2 VIEW COMPARISON:  None. FINDINGS: Mild femorotibial chondrocalcinosis. No acute fracture or dislocation. Joint spaces maintained. IMPRESSION: No acute fracture or dislocation of the left tibia or fibula. Electronically Signed   By: Ulyses Jarred M.D.   On: 12/27/2020 22:25   CT HEAD WO CONTRAST (5MM)  Result Date: 12/27/2020 CLINICAL DATA:  Pt. States they have been having back pain for awhile but can't say for how long. Pt. Has unexplained bruising Head trauma, coagulopathy (Age 47-65y.). History of intra-ocular prosthesis insertion. EXAM: CT HEAD WITHOUT CONTRAST TECHNIQUE: Contiguous axial images were obtained from the base of the skull through the vertex without intravenous contrast. COMPARISON:   CT head 03/04/2020 FINDINGS: Brain: Cerebral ventricle sizes are concordant with the degree of cerebral volume loss. Patchy and confluent areas of decreased attenuation are noted throughout the deep and periventricular white matter of the cerebral hemispheres bilaterally, compatible with chronic microvascular ischemic disease. Chronic right cerebellar infarction. No evidence of large-territorial acute infarction. No parenchymal hemorrhage. No mass lesion. No extra-axial collection. No mass effect or midline shift. No hydrocephalus. Basilar cisterns are patent. Vascular: No hyperdense vessel. Atherosclerotic calcifications are present within the cavernous internal carotid arteries. Skull: No acute fracture or focal lesion. Sinuses/Orbits: Paranasal sinuses and mastoid air cells are clear. Left orbital surgical changes with interval removal of a left orbital prosthesis. 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 Otherwise the right orbit are unremarkable. Other: None. IMPRESSION: No acute intracranial abnormality. Electronically Signed   By: Iven Finn M.D.   On: 12/27/2020 22:20   CT CHEST ABDOMEN PELVIS W CONTRAST  Result Date: 12/27/2020 CLINICAL DATA:  Generalized bruising without definitive traumatic history, initial encounter EXAM: CT CHEST, ABDOMEN, AND PELVIS WITH CONTRAST TECHNIQUE: Multidetector CT imaging of the chest, abdomen and pelvis was performed following the standard protocol during bolus administration of intravenous contrast. CONTRAST:  56m OMNIPAQUE IOHEXOL 350 MG/ML SOLN COMPARISON:  04/23/2017 FINDINGS: CT CHEST FINDINGS Cardiovascular: Thoracic aorta demonstrates atherosclerotic calcifications. Mild ectasia of the ascending aorta is noted without true aneurysmal dilatation. Coronary calcifications are seen. No dissection is noted. No cardiac enlargement is seen. The pulmonary artery as visualized is within normal limits. Mediastinum/Nodes: Thoracic inlet is within  normal limits. No sizable hilar or mediastinal adenopathy is noted. Small sliding-type hiatal hernia is noted. Lungs/Pleura: Mild dependent atelectatic changes are seen. Small left-sided pleural effusion is noted. No pneumothorax is seen. Musculoskeletal: No acute rib abnormality is noted. No compression deformity is noted. In the left posterior chest wall just lateral to the spinous processes there are multifocal soft tissue hematomas identified with hematocrit levels within. Overlying soft tissue swelling is noted consistent with the given clinical history of bruising. No area of active extravasation is identified to suggest acute hemorrhage. Given the hematocrit level these changes are likely subacute in nature. The largest of these measures  4.9 by 3.0 cm in transverse and AP dimensions. It extends for 11 cm in a craniocaudad projection CT ABDOMEN PELVIS FINDINGS Hepatobiliary: No focal liver abnormality is seen. No gallstones, gallbladder wall thickening, or biliary dilatation. Pancreas: Unremarkable. No pancreatic ductal dilatation or surrounding inflammatory changes. Spleen: Spleen is within normal limits. A heavily calcified splenic artery aneurysm is noted which measures approximately 13 mm stable in appearance from the prior exam. Adrenals/Urinary Tract: Adrenal glands are within normal limits. Kidneys demonstrate a normal enhancement pattern. No renal calculi or obstructive changes are seen. Normal excretion of contrast is noted. The ureters are unremarkable. The bladder is partially distended. Stomach/Bowel: Scattered diverticular change of the colon is noted without evidence of diverticulitis. The appendix is well visualized and within normal limits. Small bowel and stomach are unremarkable with the exception of the previously mentioned hiatal hernia. Vascular/Lymphatic: Aortic atherosclerosis. No enlarged abdominal or pelvic lymph nodes. Reproductive: Status post hysterectomy. No adnexal masses. Other:  No abdominal wall hernia or abnormality. No abdominopelvic ascites. Musculoskeletal: Postsurgical changes in the proximal left femur are seen. Degenerative lumbar spine changes are noted. IMPRESSION: CT of the chest: No acute rib abnormality is noted. In the left posterior chest just lateral to the spinous processes there are 2 large hematomas identified with hematocrit levels indicating a subacute nature. No active extravasation of hemorrhage is noted. No associated bony abnormality is seen. This likely represents spontaneous hemorrhage from ovary anticoagulation given the elevated prothrombin time and INR. Small left pleural effusion with dependent atelectatic changes. 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 Diverticulosis without diverticulitis. Hiatal hernia. Posterior soft tissue hematomas to the left of the midline in the paraspinal soft tissues similar to that noted in the chest. Critical Value/emergent results were called by telephone at the time of interpretation on 12/27/2020 at 10:22 pm to [facility] , who verbally acknowledged these results. Electronically Signed   By: Inez Catalina M.D.   On: 12/27/2020 22:22   DG Foot Complete Left  Result Date: 12/27/2020 CLINICAL DATA:  Fall EXAM: LEFT FOOT - COMPLETE 3+ VIEW COMPARISON:  None. FINDINGS: There is no evidence of fracture or dislocation. There is no evidence of arthropathy or other focal bone abnormality. Soft tissues are unremarkable. IMPRESSION: Negative. Electronically Signed   By: Ulyses Jarred M.D.   On: 12/27/2020 22:26   DG Femur Min 2 Views Left  Result Date: 12/27/2020 CLINICAL DATA:  Fall EXAM: LEFT FEMUR 2 VIEWS COMPARISON:  None. FINDINGS: Femoral antegrade intramedullary nail with interlocking component. No fracture or dislocation. No perihardware lucency. IMPRESSION: No fracture or dislocation of the left femur. Electronically Signed   By: Ulyses Jarred M.D.   On:  12/27/2020 22:24    Subjective:   Patient was seen and examined 01/04/2021, 11:28 AM Patient stable today. No acute distress.  No issues overnight Stable for discharge.  Discharge Exam:    Vitals:   01/03/21 1633 01/03/21 2148 01/04/21 0504 01/04/21 0919  BP: (!) 126/55 129/60 (!) 154/74 (!) 124/56  Pulse: 85 78 80   Resp: _0 Temp: 98.1 F (36.7 C) 98.6 F (37 C) 97.6 F (36.4 C)   TempSrc: Oral Oral    SpO2: 100% 100% 96%   Weight:      Height:          Physical Exam:   General:  Alert, oriented, cooperative, no distress;   HEENT:  Normocephalic, PERRL, otherwise with  in Normal limits   Neuro:  CNII-XII intact. , normal motor and sensation, reflexes intact   Lungs:   Clear to auscultation BL, Respirations unlabored, no wheezes / crackles  Cardio:    S1/S2, RRR, No murmure, No Rubs or Gallops   Abdomen:   Soft, non-tender, bowel sounds active all four quadrants,  no guarding or peritoneal signs.  Muscular skeletal:  Limited exam - in bed, able to move all 4 extremities, severe global generalized weaknesses, unsteady gait, need assist with ADLs 2+ pulses,  symmetric, No pitting edema  Skin:  Dry, warm to touch, negative for any Rashes,  Wounds: Please see nursing documentation           The results of significant diagnostics from this hospitalization (including imaging, microbiology, ancillary and laboratory) are listed below for reference.      Microbiology:   Recent Results (from the past 240 hour(s))  Urine Culture     Status: Abnormal   Collection Time: 12/27/20  7:40 PM   Specimen: Urine, Clean Catch  Result Value Ref Range Status   Specimen Description   Final    URINE, CLEAN CATCH Performed at [facility], [address], [place], [place] [address]    Special Requests   Final    NONE Performed at [facility], [address] [place], [place] [address]    Culture >=100,000 COLONIES/mL ESCHERICHIA COLI (A)  Final   Report Status 12/30/2020  FINAL  Final   Organism ID, Bacteria ESCHERICHIA COLI (A)  Final      Susceptibility   Escherichia coli - MIC*    AMPICILLIN <=2 SENSITIVE Sensitive     CEFAZOLIN <=4 SENSITIVE Sensitive     CEFEPIME <=0.12 SENSITIVE Sensitive     CEFTRIAXONE <=0.25 SENSITIVE Sensitive     CIPROFLOXACIN <=0.25 SENSITIVE Sensitive     GENTAMICIN <=1 SENSITIVE Sensitive     IMIPENEM <=0.25 SENSITIVE Sensitive     NITROFURANTOIN <=16 SENSITIVE Sensitive     TRIMETH/SULFA <=20 SENSITIVE Sensitive     AMPICILLIN/SULBACTAM <=2 SENSITIVE Sensitive     PIP/TAZO <=4 SENSITIVE Sensitive     * >=100,000 COLONIES/mL ESCHERICHIA COLI  Blood culture (routine x 2)     Status: None   Collection Time: 12/27/20  8:58 PM   Specimen: BLOOD  Result Value Ref Range Status   Specimen Description BLOOD LEFT ANTECUBITAL  Final   Special Requests   Final    BOTTLES DRAWN AEROBIC AND ANAEROBIC Blood Culture results may not be optimal due to an excessive volume of blood received in culture bottles   Culture   Final    NO GROWTH 5 DAYS Performed at [facility], [address], [place], [place] [address]    Report Status 01/01/2021 FINAL  Final  Blood culture (routine x 2)     Status: None   Collection Time: 12/27/20  8:58 PM   Specimen: BLOOD  Result Value Ref Range Status   Specimen Description BLOOD RIGHT ANTECUBITAL  Final   Special Requests   Final    BOTTLES DRAWN AEROBIC AND ANAEROBIC Blood Culture results may not be optimal due to an excessive volume of blood received in culture bottles   Culture   Final    NO GROWTH 5 DAYS Performed at [facility], [address], [place], [place] [address]    Report Status 01/01/2021 FINAL  Final  Resp Panel by RT-PCR (Flu A&B, Covid) Nasopharyngeal Swab     Status: None   Collection Time:  12/27/20  9:10 PM   Specimen: Nasopharyngeal Swab; Nasopharyngeal(NP) swabs in vial transport medium  Result Value Ref Range Status   SARS Coronavirus 2 by RT PCR NEGATIVE NEGATIVE Final     Comment: (NOTE) SARS-CoV-2 target nucleic acids are NOT DETECTED.  The SARS-CoV-2 RNA is generally detectable in upper respiratory specimens during the acute phase of infection. The lowest concentration of SARS-CoV-2 viral copies this assay can detect is 138 copies/mL. A negative result does not preclude SARS-Cov-2 infection and should not be used as the sole basis for treatment or other patient management decisions. A negative result may occur with  improper specimen collection/handling, submission of specimen other than nasopharyngeal swab, presence of viral mutation(s) within the areas targeted by this assay, and inadequate number of viral copies(<138 copies/mL). A negative result must be combined with clinical observations, patient history, and epidemiological information. The expected result is Negative.  Fact Sheet for Patients:  EntrepreneurPulse.com.au  Fact Sheet for Healthcare Providers:  IncredibleEmployment.be  This test is no t yet approved or cleared by the [place] FDA and  has been authorized for detection and/or diagnosis of SARS-CoV-2 by FDA under an Emergency Use Authorization (EUA). This EUA will remain  in effect (meaning this test can be used) for the duration of the COVID-19 declaration under Section 564(b)(1) of the Act, 21 U.S.C.section 360bbb-3(b)(1), unless the authorization is terminated  or revoked sooner.       Influenza A by PCR NEGATIVE NEGATIVE Final   Influenza B by PCR NEGATIVE NEGATIVE Final    Comment: (NOTE) The Xpert Xpress SARS-CoV-2/FLU/RSV plus assay is intended as an aid in the diagnosis of influenza from Nasopharyngeal swab specimens and should not be used as a sole basis for treatment. Nasal washings and aspirates are unacceptable for Xpert Xpress SARS-CoV-2/FLU/RSV testing.  Fact Sheet for Patients: EntrepreneurPulse.com.au  Fact Sheet for Healthcare  Providers: IncredibleEmployment.be  This test is not yet approved or cleared by the [place] FDA and has been authorized for detection and/or diagnosis of SARS-CoV-2 by FDA under an Emergency Use Authorization (EUA). This EUA will remain in effect (meaning this test can be used) for the duration of the COVID-19 declaration under Section 564(b)(1) of the Act, 21 U.S.C. section 360bbb-3(b)(1), unless the authorization is terminated or revoked.  Performed at [facility], [address], [place],  [address]   SARS CORONAVIRUS 2 (TAT 6-24 HRS) Nasopharyngeal Nasopharyngeal Swab     Status: None   Collection Time: 01/02/21  6:00 PM   Specimen: Nasopharyngeal Swab  Result Value Ref Range Status   SARS Coronavirus 2 NEGATIVE NEGATIVE Final    Comment: (NOTE) SARS-CoV-2 target nucleic acids are NOT DETECTED.  The SARS-CoV-2 RNA is generally detectable in upper and lower respiratory specimens during the acute phase of infection. Negative results do not preclude SARS-CoV-2 infection, do not rule out co-infections with other pathogens, and should not be used as the sole basis for treatment or other patient management decisions. Negative results must be combined with clinical observations, patient history, and epidemiological information. The expected result is Negative.  Fact Sheet for Patients: SugarRoll.be  Fact Sheet for Healthcare Providers: https://www.woods-mathews.com/  This test is not yet approved or cleared by the [place] FDA and  has been authorized for detection and/or diagnosis of SARS-CoV-2 by FDA under an Emergency Use Authorization (EUA). This EUA will remain  in effect (meaning this test can be used) for the duration of the COVID-19 declaration under Se ction 564(b)(1) of the  Act, 21 U.S.C. section 360bbb-3(b)(1), unless the authorization is terminated or revoked sooner.  Performed at [facility], [place] [address], [place], [place] [address]      Labs:   CBC: Recent Labs  Lab 12/30/20 0601 12/31/20 0808 01/01/21 0651 01/02/21 0600 01/03/21 0639  WBC 15.3* 18.7* 14.4* 13.0* 12.6*  HGB 9.2* 9.4* 8.2* 8.2* 8.5*  HCT 28.4* 28.8* 25.5* 25.6* 26.2*  MCV 91.3 90.3 92.7 91.8 91.9  PLT 730* 713* 646* 686* 830*   Basic Metabolic Panel: Recent Labs  Lab 12/30/20 0601 01/01/21 0611 01/02/21 0600 01/03/21 0639 01/04/21 0546  NA 136 137 138 139 141  K 3.8 3.5 3.5 3.5 3.5  CL 100 99 101 103 104  CO2 _0 GLUCOSE 121* 104* 106* 105* 103*  BUN 33* 27* _1 CREATININE 0.76 0.78 0.81 0.80 0.70  CALCIUM 8.4* 8.6* 8.5* 8.5* 8.4*   Liver Function Tests: No results for input(s): AST, ALT, ALKPHOS, BILITOT, PROT, ALBUMIN in the last 168 hours.  BNP (last 3 results) No results for input(s): BNP in the last 8760 hours. Cardiac Enzymes: No results for input(s): CKTOTAL, CKMB, CKMBINDEX, TROPONINI in the last 168 hours. CBG: Recent Labs  Lab 12/28/20 2222  GLUCAP 135*   Urinalysis    Component Value Date/Time   COLORURINE YELLOW 12/30/2020 2225   APPEARANCEUR CLOUDY (A) 12/30/2020 2225   LABSPEC 1.033 (H) 12/30/2020 2225   PHURINE 5.0 12/30/2020 2225   GLUCOSEU NEGATIVE 12/30/2020 2225   HGBUR SMALL (A) 12/30/2020 2225   BILIRUBINUR NEGATIVE 12/30/2020 2225   KETONESUR 5 (A) 12/30/2020 2225   PROTEINUR 30 (A) 12/30/2020 2225   UROBILINOGEN >8.0 (H) 11/27/2010 1021   NITRITE NEGATIVE 12/30/2020 2225   LEUKOCYTESUR NEGATIVE 12/30/2020 2225         Time coordinating discharge: Over 45 minutes  SIGNED: Deatra James, MD, FACP, [facility]. [facility],  Please use [email or web address] to Page If 7PM-7AM, please contact night-coverage [email or web address] Ota [facility] 01/04/2021, 11:28 AM

## 2021-01-04 NOTE — Progress Notes (Signed)
Palliative: Mrs. Pavelka is lying in bed in a dark room.  She has the covers over her head, but will speak to me.  She is alert, able to make her basic needs known.  There is no family at bedside at this time.   We talked about her acute health concerns and transferring to short-term rehab today.  She denies questions or concerns.  We talked about CODE STATUS.  She tells me that she would not want life support, even if it meant she were to pass away without it.  We talked about outpatient palliative services.  Initially, she accepts outpatient palliative services, but then later declines.  She would clearly benefit from further palliative/goals of care discussions.  [place] to continue goals of care discussions/palliative discussions.  Conference with attending, bedside nursing staff, transition of care team related to patient condition, needs, goals of care, disposition. [place]/DNR form completed and placed on chart.  Plan: Discharged to [facility] for short-term rehab.  Continue to treat the treatable but no CPR or intubation.  Would clearly benefit from outpatient palliative services.  25 minutes Quinn Axe, NP Palliative medicine team Team phone [phone] Greater than 50% of this time was spent counseling and coordinating care related to the above assessment and plan.

## 2021-01-05 DIAGNOSIS — D75839 Thrombocytosis, unspecified: Secondary | ICD-10-CM | POA: Diagnosis not present

## 2021-01-05 DIAGNOSIS — N3001 Acute cystitis with hematuria: Secondary | ICD-10-CM | POA: Diagnosis present

## 2021-01-05 DIAGNOSIS — I6930 Unspecified sequelae of cerebral infarction: Secondary | ICD-10-CM | POA: Diagnosis not present

## 2021-01-05 DIAGNOSIS — I10 Essential (primary) hypertension: Secondary | ICD-10-CM | POA: Diagnosis not present

## 2021-01-05 DIAGNOSIS — I359 Nonrheumatic aortic valve disorder, unspecified: Secondary | ICD-10-CM | POA: Diagnosis present

## 2021-01-05 DIAGNOSIS — K219 Gastro-esophageal reflux disease without esophagitis: Secondary | ICD-10-CM | POA: Diagnosis not present

## 2021-01-05 DIAGNOSIS — D45 Polycythemia vera: Secondary | ICD-10-CM | POA: Diagnosis present

## 2021-01-05 DIAGNOSIS — S279XXD Injury of unspecified intrathoracic organ, subsequent encounter: Secondary | ICD-10-CM | POA: Diagnosis not present

## 2021-01-05 DIAGNOSIS — Z952 Presence of prosthetic heart valve: Secondary | ICD-10-CM | POA: Diagnosis not present

## 2021-01-05 DIAGNOSIS — Z7901 Long term (current) use of anticoagulants: Secondary | ICD-10-CM | POA: Diagnosis not present

## 2021-01-05 DIAGNOSIS — D688 Other specified coagulation defects: Secondary | ICD-10-CM | POA: Diagnosis present

## 2021-01-05 DIAGNOSIS — R791 Abnormal coagulation profile: Secondary | ICD-10-CM | POA: Diagnosis not present

## 2021-01-05 DIAGNOSIS — Z531 Procedure and treatment not carried out because of patient's decision for reasons of belief and group pressure: Secondary | ICD-10-CM | POA: Diagnosis present

## 2021-01-05 DIAGNOSIS — B9629 Other Escherichia coli [E. coli] as the cause of diseases classified elsewhere: Secondary | ICD-10-CM | POA: Diagnosis present

## 2021-01-05 DIAGNOSIS — R5381 Other malaise: Secondary | ICD-10-CM | POA: Diagnosis not present

## 2021-01-05 DIAGNOSIS — R262 Difficulty in walking, not elsewhere classified: Secondary | ICD-10-CM | POA: Diagnosis present

## 2021-01-05 DIAGNOSIS — Z79899 Other long term (current) drug therapy: Secondary | ICD-10-CM | POA: Diagnosis not present

## 2021-01-05 DIAGNOSIS — Z8619 Personal history of other infectious and parasitic diseases: Secondary | ICD-10-CM | POA: Diagnosis not present

## 2021-01-05 DIAGNOSIS — D649 Anemia, unspecified: Secondary | ICD-10-CM | POA: Diagnosis not present

## 2021-01-05 DIAGNOSIS — R5382 Chronic fatigue, unspecified: Secondary | ICD-10-CM | POA: Diagnosis present

## 2021-01-05 DIAGNOSIS — I639 Cerebral infarction, unspecified: Secondary | ICD-10-CM | POA: Diagnosis not present

## 2021-01-05 DIAGNOSIS — G40909 Epilepsy, unspecified, not intractable, without status epilepticus: Secondary | ICD-10-CM | POA: Diagnosis present

## 2021-01-05 DIAGNOSIS — N390 Urinary tract infection, site not specified: Secondary | ICD-10-CM | POA: Diagnosis present

## 2021-01-05 DIAGNOSIS — T148XXD Other injury of unspecified body region, subsequent encounter: Secondary | ICD-10-CM | POA: Diagnosis present

## 2021-01-05 DIAGNOSIS — D599 Acquired hemolytic anemia, unspecified: Secondary | ICD-10-CM | POA: Diagnosis present

## 2021-01-05 DIAGNOSIS — H5440 Blindness, one eye, unspecified eye: Secondary | ICD-10-CM | POA: Diagnosis present

## 2021-01-05 DIAGNOSIS — M6281 Muscle weakness (generalized): Secondary | ICD-10-CM | POA: Diagnosis present

## 2021-01-05 DIAGNOSIS — E785 Hyperlipidemia, unspecified: Secondary | ICD-10-CM | POA: Diagnosis not present

## 2021-01-05 DIAGNOSIS — K589 Irritable bowel syndrome without diarrhea: Secondary | ICD-10-CM | POA: Diagnosis present

## 2021-01-05 LAB — BASIC METABOLIC PANEL
Anion gap: 5 (ref 5–15)
BUN: 16 mg/dL (ref 8–23)
CO2: 29 mmol/L (ref 22–32)
Calcium: 8.4 mg/dL — ABNORMAL LOW (ref 8.9–10.3)
Chloride: 104 mmol/L (ref 98–111)
Creatinine, Ser: 0.71 mg/dL (ref 0.44–1.00)
GFR, Estimated: >60 mL/min (ref >60)
Glucose, Bld: 106 mg/dL — ABNORMAL HIGH (ref 70–99)
Potassium: 3.7 mmol/L (ref 3.5–5.1)
Sodium: 138 mmol/L (ref 135–145)

## 2021-01-05 LAB — PROTIME-INR
INR: 2.3 — ABNORMAL HIGH (ref 0.8–1.2)
Prothrombin Time: 25.2 seconds — ABNORMAL HIGH (ref 11.4–15.2)

## 2021-01-05 NOTE — Progress Notes (Signed)
Patient very agitated when NT entered room to obtain VS. NT asked pt if she needed to use restroom and pt replied "no".

## 2021-01-06 DIAGNOSIS — N390 Urinary tract infection, site not specified: Secondary | ICD-10-CM | POA: Diagnosis not present

## 2021-01-06 DIAGNOSIS — K219 Gastro-esophageal reflux disease without esophagitis: Secondary | ICD-10-CM | POA: Diagnosis not present

## 2021-01-06 DIAGNOSIS — D649 Anemia, unspecified: Secondary | ICD-10-CM | POA: Diagnosis not present

## 2021-01-06 DIAGNOSIS — I10 Essential (primary) hypertension: Secondary | ICD-10-CM | POA: Diagnosis not present

## 2021-01-06 DIAGNOSIS — Z952 Presence of prosthetic heart valve: Secondary | ICD-10-CM | POA: Diagnosis not present

## 2021-01-06 DIAGNOSIS — R791 Abnormal coagulation profile: Secondary | ICD-10-CM | POA: Diagnosis not present

## 2021-01-06 DIAGNOSIS — R5381 Other malaise: Secondary | ICD-10-CM | POA: Diagnosis not present

## 2021-01-10 NOTE — Discharge Summary (Signed)
Physician Discharge Summary [facility]    Patient: Ana [name]                   Admit date: 12/27/2020   DOB: 09/15/1945             Discharge date:01/10/2021/11:09 AM OVZ:[id number]                          PCP: Celene Squibb, MD  Disposition: SNF  Recommendations for Outpatient Follow-up:   Follow up: With PCP within 1 week. -Note to PCP, lower dose of Coumadin daily if possible INR to be checked daily, with a goal to be close to 2.0 (high risk for CVA -On this admission H&H finally stabilized at 8.2, no further bleeding, (Jehovah's Witness no agreement to platelet or blood product transfusion) Severe debility-currently bedbound-not motivated to get out of bed or move out of bed   Discharge Condition: Stable   Code Status:   Code Status: Prior  Diet recommendation: Regular healthy diet   The patient was seen and examined this morning.  Stable cleared to be discharged to SNF No changes to this discharge summary -INR therapeutic today 2.4  No changes to  this discharge summary  Discussion - to SNF today   Discharge Diagnoses:    Principal Problem:   Supratherapeutic INR Active Problems:   Essential hypertension   Seizure disorder (HCC)   Thrombocytosis   H/o CVA (cerebral vascular accident) ([place])   Leukocytosis   Intrathoracic hematoma   Intramuscular hematoma   Blood transfusion declined because patient is Jehovah's Witness   History of Present Illness/ Hospital Course Ana [name] Summary:    Brief Summary:- 75 y.o. female with medical history significant for hypertension, history of CVA, mechanical aortic valve on warfarin, and refusal of blood products admitted with back pain and bruising and found to have elevated INR above 10 in the setting of chronic Coumadin therapy for mechanical valve   ----------------------------------------------------------------------------------------------------------------------------------------------     1)Supratherapeutic INR with hematomas--- -H&H and hematoma stable, INR 2.1   CT chest abdomen and pelvis shows left posterior chest just lateral to the spinous processes there are 2 large subacute hematomas and Posterior soft tissue hematomas to the left of the midline in the paraspinal soft tissues similar to that noted in the chest. -CT head without ICH -X-rays of the left femur, left tibia-fibula on the left foot without acute findings -INR remains greater than 10 despite 2 doses of oral vitamin K 9 hours apart -Discussed with pharmacy will give IV vitamin K   x 1 -Patient declines FFP as noted below in #3 -monitor H&H, q. Weekly,INR-to be checked daily for now -INR >10 on admission , now 1.4 >>> 1.2, 1.5 >>>2.1    2)Acute anemia - secondary to 1 above,  -Hemoglobin is down to 10.7 from 12.1 admission >> 9.9, 9.2>> 9.4 >> 8.2, 8.2       3)Social/Ethics--- patient is a full code, -Patient is a Jehovah witness and states categorically that she would Not accept FFP, PRBC or any other blood products even if she is bleeding out and even if she was at risk of dying -We will respect patient's religious views and honor her wishes   4)Persistent Leukocytosis and thrombocytosis- -- patient with longstanding history of polycythemia, outpatient follow-up with hematology as advised   5)Mechanical Valve- - S/p St Jude AVR placed in 1998 -Hold Coumadin due to # 1 above -INR  goal 2-3 -Resume Coumadin at lower dose with INR close to 2, INR 2.1 today  -The risk and benefit of anticoagulation was discussed with patient and her son in detail Discussed with pharmacy, reinitiated Coumadin but on hold today due to drop in hemoglobin   6) UTI--urine culture positive for E. coli, pansensitive, previously on Macrobid,  Continue p.o. antibiotics of ciprofloxacin Leukocytosis but afebrile WBC 21.8, 15.3 >> 18.7, 14.4 oday   7) hyperglycemia--- no prior history of diabetes, A1c: 5.9    8)HTN--amlodipine 5 mg daily   9) severe debility-due to previous strokes, 1 eye blindness -Per patient's son post previous stroke patient is now basically chair bound, bedbound Has trouble with ambulating, needs assist with most ADLs -Family providing as much as Possible at home -Recommended SNF,         Disposition: The patient is from: Home              Anticipated d/c is to: Skilled nursing facility   Code Status :  -  Code Status: Full Code          Discharge Instructions:   Discharge Instructions     Activity as tolerated - No restrictions   Complete by: As directed    Diet - low sodium heart healthy   Complete by: As directed    Discharge instructions   Complete by: As directed    Very close monitoring of INR, if possible check daily, current Coumadin dose 5 mg recommending INR to be close to 2.0... To avoid any further bleeding, and complication CBC within 5-7 days..  to be Reviewed by PCP   Increase activity slowly   Complete by: As directed         Medication List     STOP taking these medications    cephALEXin 500 MG capsule Commonly known as: KEFLEX   enoxaparin 30 MG/0.3ML injection Commonly known as: LOVENOX   ibuprofen 800 MG tablet Commonly known as: ADVIL       TAKE these medications    acetaminophen 500 MG tablet Commonly known as: TYLENOL Take 1 tablet (500 mg total) by mouth every 8 (eight) hours as needed for mild pain or moderate pain. What changed: Another medication with the same name was added. Make sure you understand how and when to take each.   acetaminophen 325 MG tablet Commonly known as: TYLENOL Take 2 tablets (650 mg total) by mouth every 6 (six) hours as needed for mild pain (or Fever >/= 101). What changed: You were already taking a medication with the same name, and this prescription was added. Make sure you understand how and when to take each.   amLODipine 10 MG tablet Commonly known as: NORVASC Take 10 mg  by mouth every evening.   B-12 Compliance Injection 1000 MCG/ML Kit Generic drug: Cyanocobalamin Inject 1 mL as directed See admin instructions. EVERY 4 DAYS   melatonin 3 MG Tabs tablet Take 1 tablet (3 mg total) by mouth at bedtime as needed.   pantoprazole 40 MG tablet Commonly known as: PROTONIX Take 1 tablet (40 mg total) by mouth daily.   warfarin 5 MG tablet Commonly known as: COUMADIN Take 1 tablet (5 mg total) by mouth one time only at 4 PM. What changed:  medication strength how much to take when to take this additional instructions   warfarin 2.5 MG tablet Commonly known as: COUMADIN Take 1 tablet (2.5 mg total) by mouth one time only at 4 PM. What changed:  medication strength how much to take when to take this additional instructions         Contact information for after-discharge care     [facility] .   Service: Skilled Nursing Contact information: [address] [address] [phone]                    Allergies  Allergen Reactions   Blood-Group Specific Substance     NO BLOOD PRODUCTS     Procedures /Studies:   DG Tibia/Fibula Left  Result Date: 12/27/2020 CLINICAL DATA:  Fall EXAM: LEFT TIBIA AND FIBULA - 2 VIEW COMPARISON:  None. FINDINGS: Mild femorotibial chondrocalcinosis. No acute fracture or dislocation. Joint spaces maintained. IMPRESSION: No acute fracture or dislocation of the left tibia or fibula. Electronically Signed   By: Ulyses Jarred M.D.   On: 12/27/2020 22:25   CT HEAD WO CONTRAST (5MM)  Result Date: 12/27/2020 CLINICAL DATA:  Pt. States they have been having back pain for awhile but can't say for how long. Pt. Has unexplained bruising Head trauma, coagulopathy (Age [age]y.). History of intra-ocular prosthesis insertion. EXAM: CT HEAD WITHOUT CONTRAST TECHNIQUE: Contiguous axial images were obtained from the base of the skull  through the vertex without intravenous contrast. COMPARISON:  CT head 03/04/2020 FINDINGS: Brain: Cerebral ventricle sizes are concordant with the degree of cerebral volume loss. Patchy and confluent areas of decreased attenuation are noted throughout the deep and periventricular white matter of the cerebral hemispheres bilaterally, compatible with chronic microvascular ischemic disease. Chronic right cerebellar infarction. No evidence of large-territorial acute infarction. No parenchymal hemorrhage. No mass lesion. No extra-axial collection. No mass effect or midline shift. No hydrocephalus. Basilar cisterns are patent. Vascular: No hyperdense vessel. Atherosclerotic calcifications are present within the cavernous internal carotid arteries. Skull: No acute fracture or focal lesion. Sinuses/Orbits: Paranasal sinuses and mastoid air cells are clear. Left orbital surgical changes with interval removal of a left orbital prosthesis. 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 Otherwise the right orbit are unremarkable. Other: None. IMPRESSION: No acute intracranial abnormality. Electronically Signed   By: Iven Finn M.D.   On: 12/27/2020 22:20   CT CHEST ABDOMEN PELVIS W CONTRAST  Result Date: 12/27/2020 CLINICAL DATA:  Generalized bruising without definitive traumatic history, initial encounter EXAM: CT CHEST, ABDOMEN, AND PELVIS WITH CONTRAST TECHNIQUE: Multidetector CT imaging of the chest, abdomen and pelvis was performed following the standard protocol during bolus administration of intravenous contrast. CONTRAST:  16m OMNIPAQUE IOHEXOL 350 MG/ML SOLN COMPARISON:  04/23/2017 FINDINGS: CT CHEST FINDINGS Cardiovascular: Thoracic aorta demonstrates atherosclerotic calcifications. Mild ectasia of the ascending aorta is noted without true aneurysmal dilatation. Coronary calcifications are seen. No dissection is noted. No cardiac enlargement is seen. The pulmonary artery as visualized is within  normal limits. Mediastinum/Nodes: Thoracic inlet is within normal limits. No sizable hilar or mediastinal adenopathy is noted. Small sliding-type hiatal hernia is noted. Lungs/Pleura: Mild dependent atelectatic changes are seen. Small left-sided pleural effusion is noted. No pneumothorax is seen. Musculoskeletal: No acute rib abnormality is noted. No compression deformity is noted. In the left posterior chest wall just lateral to the spinous processes there are multifocal soft tissue hematomas identified with hematocrit levels within. Overlying soft tissue swelling is noted consistent with the given clinical history of bruising. No area of active extravasation is identified to suggest acute hemorrhage. Given the hematocrit level these changes are likely subacute in nature.  The largest of these measures 4.9 by 3.0 cm in transverse and AP dimensions. It extends for 11 cm in a craniocaudad projection CT ABDOMEN PELVIS FINDINGS Hepatobiliary: No focal liver abnormality is seen. No gallstones, gallbladder wall thickening, or biliary dilatation. Pancreas: Unremarkable. No pancreatic ductal dilatation or surrounding inflammatory changes. Spleen: Spleen is within normal limits. A heavily calcified splenic artery aneurysm is noted which measures approximately 13 mm stable in appearance from the prior exam. Adrenals/Urinary Tract: Adrenal glands are within normal limits. Kidneys demonstrate a normal enhancement pattern. No renal calculi or obstructive changes are seen. Normal excretion of contrast is noted. The ureters are unremarkable. The bladder is partially distended. Stomach/Bowel: Scattered diverticular change of the colon is noted without evidence of diverticulitis. The appendix is well visualized and within normal limits. Small bowel and stomach are unremarkable with the exception of the previously mentioned hiatal hernia. Vascular/Lymphatic: Aortic atherosclerosis. No enlarged abdominal or pelvic lymph nodes.  Reproductive: Status post hysterectomy. No adnexal masses. Other: No abdominal wall hernia or abnormality. No abdominopelvic ascites. Musculoskeletal: Postsurgical changes in the proximal left femur are seen. Degenerative lumbar spine changes are noted. IMPRESSION: CT of the chest: No acute rib abnormality is noted. In the left posterior chest just lateral to the spinous processes there are 2 large hematomas identified with hematocrit levels indicating a subacute nature. No active extravasation of hemorrhage is noted. No associated bony abnormality is seen. This likely represents spontaneous hemorrhage from ovary anticoagulation given the elevated prothrombin time and INR. Small left pleural effusion with dependent atelectatic changes. 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 Diverticulosis without diverticulitis. Hiatal hernia. Posterior soft tissue hematomas to the left of the midline in the paraspinal soft tissues similar to that noted in the chest. Critical Value/emergent results were called by telephone at the time of interpretation on 12/27/2020 at 10:22 pm to [facility] , who verbally acknowledged these results. Electronically Signed   By: Inez Catalina M.D.   On: 12/27/2020 22:22   DG Foot Complete Left  Result Date: 12/27/2020 CLINICAL DATA:  Fall EXAM: LEFT FOOT - COMPLETE 3+ VIEW COMPARISON:  None. FINDINGS: There is no evidence of fracture or dislocation. There is no evidence of arthropathy or other focal bone abnormality. Soft tissues are unremarkable. IMPRESSION: Negative. Electronically Signed   By: Ulyses Jarred M.D.   On: 12/27/2020 22:26   DG Femur Min 2 Views Left  Result Date: 12/27/2020 CLINICAL DATA:  Fall EXAM: LEFT FEMUR 2 VIEWS COMPARISON:  None. FINDINGS: Femoral antegrade intramedullary nail with interlocking component. No fracture or dislocation. No perihardware lucency. IMPRESSION: No fracture or dislocation of the left  femur. Electronically Signed   By: Ulyses Jarred M.D.   On: 12/27/2020 22:24    Subjective:   Patient was seen and examined 01/10/2021, 11:09 AM Patient stable today. No acute distress.  No issues overnight Stable for discharge.  Discharge Exam:    Vitals:   01/04/21 0919 01/04/21 1100 01/04/21 1400 01/05/21 0626  BP: (!) 124/56 129/68 119/62 122/60  Pulse:  77 80 70  Resp:  18 16 18   Temp:  97.9 F (36.6 C) 97.9 F (36.6 C) 98.2 F (36.8 C)  TempSrc:  Oral Oral Oral  SpO2:  98% 97% 98%  Weight:      Height:          Physical Exam:   General:  Alert, oriented, cooperative, no distress;   HEENT:  Normocephalic,  PERRL, otherwise with in Normal limits   Neuro:  CNII-XII intact. , normal motor and sensation, reflexes intact   Lungs:   Clear to auscultation BL, Respirations unlabored, no wheezes / crackles  Cardio:    S1/S2, RRR, No murmure, No Rubs or Gallops   Abdomen:   Soft, non-tender, bowel sounds active all four quadrants,  no guarding or peritoneal signs.  Muscular skeletal:  Limited exam - in bed, able to move all 4 extremities, severe global generalized weaknesses, unsteady gait, need assist with ADLs 2+ pulses,  symmetric, No pitting edema  Skin:  Dry, warm to touch, negative for any Rashes,  Wounds: Please see nursing documentation           The results of significant diagnostics from this hospitalization (including imaging, microbiology, ancillary and laboratory) are listed below for reference.      Microbiology:   Recent Results (from the past 240 hour(s))  SARS CORONAVIRUS 2 (TAT 6-24 HRS) Nasopharyngeal Nasopharyngeal Swab     Status: None   Collection Time: 01/02/21  6:00 PM   Specimen: Nasopharyngeal Swab  Result Value Ref Range Status   SARS Coronavirus 2 NEGATIVE NEGATIVE Final    Comment: (NOTE) SARS-CoV-2 target nucleic acids are NOT DETECTED.  The SARS-CoV-2 RNA is generally detectable in upper and lower respiratory specimens during the  acute phase of infection. Negative results do not preclude SARS-CoV-2 infection, do not rule out co-infections with other pathogens, and should not be used as the sole basis for treatment or other patient management decisions. Negative results must be combined with clinical observations, patient history, and epidemiological information. The expected result is Negative.  Fact Sheet for Patients: SugarRoll.be  Fact Sheet for Healthcare Providers: https://www.woods-mathews.com/  This test is not yet approved or cleared by the [place] FDA and  has been authorized for detection and/or diagnosis of SARS-CoV-2 by FDA under an Emergency Use Authorization (EUA). This EUA will remain  in effect (meaning this test can be used) for the duration of the COVID-19 declaration under Se ction 564(b)(1) of the Act, 21 U.S.C. section 360bbb-3(b)(1), unless the authorization is terminated or revoked sooner.  Performed at [facility], [address], [place], [place] [address]      Labs:   CBC: No results for input(s): WBC, NEUTROABS, HGB, HCT, MCV, PLT in the last 168 hours.  Basic Metabolic Panel: Recent Labs  Lab 01/04/21 0546 01/05/21 0540  NA 141 138  K 3.5 3.7  CL 104 104  CO2 30 29  GLUCOSE 103* 106*  BUN 17 16  CREATININE 0.70 0.71  CALCIUM 8.4* 8.4*   Liver Function Tests: No results for input(s): AST, ALT, ALKPHOS, BILITOT, PROT, ALBUMIN in the last 168 hours.  BNP (last 3 results) No results for input(s): BNP in the last 8760 hours. Cardiac Enzymes: No results for input(s): CKTOTAL, CKMB, CKMBINDEX, TROPONINI in the last 168 hours. CBG: No results for input(s): GLUCAP in the last 168 hours.  Urinalysis    Component Value Date/Time   COLORURINE YELLOW 12/30/2020 2225   APPEARANCEUR CLOUDY (A) 12/30/2020 2225   LABSPEC 1.033 (H) 12/30/2020 2225   PHURINE 5.0 12/30/2020 2225   GLUCOSEU NEGATIVE 12/30/2020 2225    HGBUR SMALL (A) 12/30/2020 2225   BILIRUBINUR NEGATIVE 12/30/2020 2225   KETONESUR 5 (A) 12/30/2020 2225   PROTEINUR 30 (A) 12/30/2020 2225   UROBILINOGEN >8.0 (H) 11/27/2010 1021   NITRITE NEGATIVE 12/30/2020 2225   LEUKOCYTESUR NEGATIVE 12/30/2020 2225  Time coordinating discharge: Over 45 minutes  SIGNED: Deatra James, MD, FACP, [facility]. [facility],  Please use [email or web address] to Page If 7PM-7AM, please contact night-coverage [email or web address], Password [facility] 01/10/2021, 11:09 AM

## 2021-01-11 DIAGNOSIS — Z79899 Other long term (current) drug therapy: Secondary | ICD-10-CM | POA: Diagnosis not present

## 2021-01-14 DIAGNOSIS — E785 Hyperlipidemia, unspecified: Secondary | ICD-10-CM | POA: Diagnosis not present

## 2021-01-14 DIAGNOSIS — D649 Anemia, unspecified: Secondary | ICD-10-CM | POA: Diagnosis not present

## 2021-01-18 DIAGNOSIS — R791 Abnormal coagulation profile: Secondary | ICD-10-CM | POA: Diagnosis not present

## 2021-01-18 DIAGNOSIS — Z7901 Long term (current) use of anticoagulants: Secondary | ICD-10-CM | POA: Diagnosis not present

## 2021-01-18 DIAGNOSIS — E785 Hyperlipidemia, unspecified: Secondary | ICD-10-CM | POA: Diagnosis not present

## 2021-01-27 DIAGNOSIS — A6920 Lyme disease, unspecified: Secondary | ICD-10-CM | POA: Diagnosis not present

## 2021-01-27 DIAGNOSIS — Z970 Presence of artificial eye: Secondary | ICD-10-CM | POA: Diagnosis not present

## 2021-01-27 DIAGNOSIS — Z952 Presence of prosthetic heart valve: Secondary | ICD-10-CM | POA: Diagnosis not present

## 2021-01-27 DIAGNOSIS — I10 Essential (primary) hypertension: Secondary | ICD-10-CM | POA: Diagnosis not present

## 2021-01-27 DIAGNOSIS — Z7901 Long term (current) use of anticoagulants: Secondary | ICD-10-CM | POA: Diagnosis not present

## 2021-01-27 DIAGNOSIS — M150 Primary generalized (osteo)arthritis: Secondary | ICD-10-CM | POA: Diagnosis not present

## 2021-01-27 DIAGNOSIS — I69354 Hemiplegia and hemiparesis following cerebral infarction affecting left non-dominant side: Secondary | ICD-10-CM | POA: Diagnosis not present

## 2021-01-27 DIAGNOSIS — F32A Depression, unspecified: Secondary | ICD-10-CM | POA: Diagnosis not present

## 2021-01-27 DIAGNOSIS — Z9181 History of falling: Secondary | ICD-10-CM | POA: Diagnosis not present

## 2021-01-27 DIAGNOSIS — E785 Hyperlipidemia, unspecified: Secondary | ICD-10-CM | POA: Diagnosis not present

## 2021-01-27 DIAGNOSIS — D649 Anemia, unspecified: Secondary | ICD-10-CM | POA: Diagnosis not present

## 2021-01-27 DIAGNOSIS — G4089 Other seizures: Secondary | ICD-10-CM | POA: Diagnosis not present

## 2021-01-27 DIAGNOSIS — M549 Dorsalgia, unspecified: Secondary | ICD-10-CM | POA: Diagnosis not present

## 2021-02-02 DIAGNOSIS — M549 Dorsalgia, unspecified: Secondary | ICD-10-CM | POA: Diagnosis not present

## 2021-02-02 DIAGNOSIS — I10 Essential (primary) hypertension: Secondary | ICD-10-CM | POA: Diagnosis not present

## 2021-02-02 DIAGNOSIS — M150 Primary generalized (osteo)arthritis: Secondary | ICD-10-CM | POA: Diagnosis not present

## 2021-02-02 DIAGNOSIS — G4089 Other seizures: Secondary | ICD-10-CM | POA: Diagnosis not present

## 2021-02-02 DIAGNOSIS — I69354 Hemiplegia and hemiparesis following cerebral infarction affecting left non-dominant side: Secondary | ICD-10-CM | POA: Diagnosis not present

## 2021-02-02 DIAGNOSIS — E785 Hyperlipidemia, unspecified: Secondary | ICD-10-CM | POA: Diagnosis not present

## 2021-02-04 DIAGNOSIS — M150 Primary generalized (osteo)arthritis: Secondary | ICD-10-CM | POA: Diagnosis not present

## 2021-02-04 DIAGNOSIS — E785 Hyperlipidemia, unspecified: Secondary | ICD-10-CM | POA: Diagnosis not present

## 2021-02-04 DIAGNOSIS — I10 Essential (primary) hypertension: Secondary | ICD-10-CM | POA: Diagnosis not present

## 2021-02-04 DIAGNOSIS — G4089 Other seizures: Secondary | ICD-10-CM | POA: Diagnosis not present

## 2021-02-04 DIAGNOSIS — M549 Dorsalgia, unspecified: Secondary | ICD-10-CM | POA: Diagnosis not present

## 2021-02-04 DIAGNOSIS — I69354 Hemiplegia and hemiparesis following cerebral infarction affecting left non-dominant side: Secondary | ICD-10-CM | POA: Diagnosis not present

## 2021-02-14 DIAGNOSIS — I10 Essential (primary) hypertension: Secondary | ICD-10-CM | POA: Diagnosis not present

## 2021-02-14 DIAGNOSIS — I69354 Hemiplegia and hemiparesis following cerebral infarction affecting left non-dominant side: Secondary | ICD-10-CM | POA: Diagnosis not present

## 2021-02-14 DIAGNOSIS — M549 Dorsalgia, unspecified: Secondary | ICD-10-CM | POA: Diagnosis not present

## 2021-02-14 DIAGNOSIS — E785 Hyperlipidemia, unspecified: Secondary | ICD-10-CM | POA: Diagnosis not present

## 2021-02-14 DIAGNOSIS — M150 Primary generalized (osteo)arthritis: Secondary | ICD-10-CM | POA: Diagnosis not present

## 2021-02-14 DIAGNOSIS — G4089 Other seizures: Secondary | ICD-10-CM | POA: Diagnosis not present

## 2021-02-15 DIAGNOSIS — R269 Unspecified abnormalities of gait and mobility: Secondary | ICD-10-CM | POA: Diagnosis not present

## 2021-02-15 DIAGNOSIS — H547 Unspecified visual loss: Secondary | ICD-10-CM | POA: Diagnosis not present

## 2021-02-15 DIAGNOSIS — I10 Essential (primary) hypertension: Secondary | ICD-10-CM | POA: Diagnosis not present

## 2021-02-18 DIAGNOSIS — I10 Essential (primary) hypertension: Secondary | ICD-10-CM | POA: Diagnosis not present

## 2021-02-18 DIAGNOSIS — G4089 Other seizures: Secondary | ICD-10-CM | POA: Diagnosis not present

## 2021-02-18 DIAGNOSIS — I69354 Hemiplegia and hemiparesis following cerebral infarction affecting left non-dominant side: Secondary | ICD-10-CM | POA: Diagnosis not present

## 2021-02-18 DIAGNOSIS — M150 Primary generalized (osteo)arthritis: Secondary | ICD-10-CM | POA: Diagnosis not present

## 2021-02-18 DIAGNOSIS — M549 Dorsalgia, unspecified: Secondary | ICD-10-CM | POA: Diagnosis not present

## 2021-02-18 DIAGNOSIS — E785 Hyperlipidemia, unspecified: Secondary | ICD-10-CM | POA: Diagnosis not present

## 2021-02-21 DIAGNOSIS — M150 Primary generalized (osteo)arthritis: Secondary | ICD-10-CM | POA: Diagnosis not present

## 2021-02-21 DIAGNOSIS — G4089 Other seizures: Secondary | ICD-10-CM | POA: Diagnosis not present

## 2021-02-21 DIAGNOSIS — I69354 Hemiplegia and hemiparesis following cerebral infarction affecting left non-dominant side: Secondary | ICD-10-CM | POA: Diagnosis not present

## 2021-02-21 DIAGNOSIS — I10 Essential (primary) hypertension: Secondary | ICD-10-CM | POA: Diagnosis not present

## 2021-02-21 DIAGNOSIS — M549 Dorsalgia, unspecified: Secondary | ICD-10-CM | POA: Diagnosis not present

## 2021-02-21 DIAGNOSIS — E785 Hyperlipidemia, unspecified: Secondary | ICD-10-CM | POA: Diagnosis not present

## 2021-02-22 DIAGNOSIS — E785 Hyperlipidemia, unspecified: Secondary | ICD-10-CM | POA: Diagnosis not present

## 2021-02-22 DIAGNOSIS — I10 Essential (primary) hypertension: Secondary | ICD-10-CM | POA: Diagnosis not present

## 2021-02-22 DIAGNOSIS — M150 Primary generalized (osteo)arthritis: Secondary | ICD-10-CM | POA: Diagnosis not present

## 2021-02-22 DIAGNOSIS — M549 Dorsalgia, unspecified: Secondary | ICD-10-CM | POA: Diagnosis not present

## 2021-02-22 DIAGNOSIS — I69354 Hemiplegia and hemiparesis following cerebral infarction affecting left non-dominant side: Secondary | ICD-10-CM | POA: Diagnosis not present

## 2021-02-22 DIAGNOSIS — G4089 Other seizures: Secondary | ICD-10-CM | POA: Diagnosis not present

## 2021-02-24 DIAGNOSIS — M150 Primary generalized (osteo)arthritis: Secondary | ICD-10-CM | POA: Diagnosis not present

## 2021-02-24 DIAGNOSIS — E785 Hyperlipidemia, unspecified: Secondary | ICD-10-CM | POA: Diagnosis not present

## 2021-02-24 DIAGNOSIS — M549 Dorsalgia, unspecified: Secondary | ICD-10-CM | POA: Diagnosis not present

## 2021-02-24 DIAGNOSIS — I69354 Hemiplegia and hemiparesis following cerebral infarction affecting left non-dominant side: Secondary | ICD-10-CM | POA: Diagnosis not present

## 2021-02-24 DIAGNOSIS — G4089 Other seizures: Secondary | ICD-10-CM | POA: Diagnosis not present

## 2021-02-24 DIAGNOSIS — I10 Essential (primary) hypertension: Secondary | ICD-10-CM | POA: Diagnosis not present

## 2021-02-26 DIAGNOSIS — E785 Hyperlipidemia, unspecified: Secondary | ICD-10-CM | POA: Diagnosis not present

## 2021-02-26 DIAGNOSIS — I10 Essential (primary) hypertension: Secondary | ICD-10-CM | POA: Diagnosis not present

## 2021-02-26 DIAGNOSIS — A6920 Lyme disease, unspecified: Secondary | ICD-10-CM | POA: Diagnosis not present

## 2021-02-26 DIAGNOSIS — F32A Depression, unspecified: Secondary | ICD-10-CM | POA: Diagnosis not present

## 2021-02-26 DIAGNOSIS — Z952 Presence of prosthetic heart valve: Secondary | ICD-10-CM | POA: Diagnosis not present

## 2021-02-26 DIAGNOSIS — I69354 Hemiplegia and hemiparesis following cerebral infarction affecting left non-dominant side: Secondary | ICD-10-CM | POA: Diagnosis not present

## 2021-02-26 DIAGNOSIS — Z970 Presence of artificial eye: Secondary | ICD-10-CM | POA: Diagnosis not present

## 2021-02-26 DIAGNOSIS — M549 Dorsalgia, unspecified: Secondary | ICD-10-CM | POA: Diagnosis not present

## 2021-02-26 DIAGNOSIS — G4089 Other seizures: Secondary | ICD-10-CM | POA: Diagnosis not present

## 2021-02-26 DIAGNOSIS — D649 Anemia, unspecified: Secondary | ICD-10-CM | POA: Diagnosis not present

## 2021-02-26 DIAGNOSIS — Z7901 Long term (current) use of anticoagulants: Secondary | ICD-10-CM | POA: Diagnosis not present

## 2021-02-26 DIAGNOSIS — M150 Primary generalized (osteo)arthritis: Secondary | ICD-10-CM | POA: Diagnosis not present

## 2021-02-26 DIAGNOSIS — Z9181 History of falling: Secondary | ICD-10-CM | POA: Diagnosis not present

## 2021-02-28 DIAGNOSIS — M150 Primary generalized (osteo)arthritis: Secondary | ICD-10-CM | POA: Diagnosis not present

## 2021-02-28 DIAGNOSIS — E785 Hyperlipidemia, unspecified: Secondary | ICD-10-CM | POA: Diagnosis not present

## 2021-02-28 DIAGNOSIS — M549 Dorsalgia, unspecified: Secondary | ICD-10-CM | POA: Diagnosis not present

## 2021-02-28 DIAGNOSIS — G4089 Other seizures: Secondary | ICD-10-CM | POA: Diagnosis not present

## 2021-02-28 DIAGNOSIS — I10 Essential (primary) hypertension: Secondary | ICD-10-CM | POA: Diagnosis not present

## 2021-02-28 DIAGNOSIS — I69354 Hemiplegia and hemiparesis following cerebral infarction affecting left non-dominant side: Secondary | ICD-10-CM | POA: Diagnosis not present

## 2021-03-07 DIAGNOSIS — M150 Primary generalized (osteo)arthritis: Secondary | ICD-10-CM | POA: Diagnosis not present

## 2021-03-07 DIAGNOSIS — I10 Essential (primary) hypertension: Secondary | ICD-10-CM | POA: Diagnosis not present

## 2021-03-07 DIAGNOSIS — I69354 Hemiplegia and hemiparesis following cerebral infarction affecting left non-dominant side: Secondary | ICD-10-CM | POA: Diagnosis not present

## 2021-03-07 DIAGNOSIS — M549 Dorsalgia, unspecified: Secondary | ICD-10-CM | POA: Diagnosis not present

## 2021-03-07 DIAGNOSIS — E785 Hyperlipidemia, unspecified: Secondary | ICD-10-CM | POA: Diagnosis not present

## 2021-03-07 DIAGNOSIS — G4089 Other seizures: Secondary | ICD-10-CM | POA: Diagnosis not present

## 2021-03-25 DIAGNOSIS — E785 Hyperlipidemia, unspecified: Secondary | ICD-10-CM | POA: Diagnosis not present

## 2021-03-25 DIAGNOSIS — I69354 Hemiplegia and hemiparesis following cerebral infarction affecting left non-dominant side: Secondary | ICD-10-CM | POA: Diagnosis not present

## 2021-03-25 DIAGNOSIS — G4089 Other seizures: Secondary | ICD-10-CM | POA: Diagnosis not present

## 2021-03-25 DIAGNOSIS — M150 Primary generalized (osteo)arthritis: Secondary | ICD-10-CM | POA: Diagnosis not present

## 2021-03-25 DIAGNOSIS — I10 Essential (primary) hypertension: Secondary | ICD-10-CM | POA: Diagnosis not present

## 2021-03-25 DIAGNOSIS — M549 Dorsalgia, unspecified: Secondary | ICD-10-CM | POA: Diagnosis not present

## 2021-04-18 DIAGNOSIS — H5440 Blindness, one eye, unspecified eye: Secondary | ICD-10-CM | POA: Diagnosis not present

## 2021-04-18 DIAGNOSIS — I10 Essential (primary) hypertension: Secondary | ICD-10-CM | POA: Diagnosis not present

## 2021-04-18 DIAGNOSIS — K219 Gastro-esophageal reflux disease without esophagitis: Secondary | ICD-10-CM | POA: Diagnosis not present

## 2021-04-18 DIAGNOSIS — R791 Abnormal coagulation profile: Secondary | ICD-10-CM | POA: Diagnosis not present

## 2021-04-18 DIAGNOSIS — Z5181 Encounter for therapeutic drug level monitoring: Secondary | ICD-10-CM | POA: Diagnosis not present

## 2021-04-18 DIAGNOSIS — Z954 Presence of other heart-valve replacement: Secondary | ICD-10-CM | POA: Diagnosis not present

## 2021-04-18 DIAGNOSIS — Z79899 Other long term (current) drug therapy: Secondary | ICD-10-CM | POA: Diagnosis not present

## 2021-04-18 DIAGNOSIS — I359 Nonrheumatic aortic valve disorder, unspecified: Secondary | ICD-10-CM | POA: Diagnosis not present

## 2021-04-18 DIAGNOSIS — E782 Mixed hyperlipidemia: Secondary | ICD-10-CM | POA: Diagnosis not present

## 2021-04-18 DIAGNOSIS — D72829 Elevated white blood cell count, unspecified: Secondary | ICD-10-CM | POA: Diagnosis not present

## 2021-04-18 DIAGNOSIS — I69354 Hemiplegia and hemiparesis following cerebral infarction affecting left non-dominant side: Secondary | ICD-10-CM | POA: Diagnosis not present

## 2021-04-18 DIAGNOSIS — D519 Vitamin B12 deficiency anemia, unspecified: Secondary | ICD-10-CM | POA: Diagnosis not present

## 2021-04-18 DIAGNOSIS — M6281 Muscle weakness (generalized): Secondary | ICD-10-CM | POA: Diagnosis not present

## 2021-04-18 DIAGNOSIS — E441 Mild protein-calorie malnutrition: Secondary | ICD-10-CM | POA: Diagnosis not present

## 2021-12-22 DIAGNOSIS — R791 Abnormal coagulation profile: Secondary | ICD-10-CM | POA: Diagnosis not present

## 2021-12-22 DIAGNOSIS — Z7901 Long term (current) use of anticoagulants: Secondary | ICD-10-CM | POA: Diagnosis not present

## 2021-12-22 DIAGNOSIS — M6281 Muscle weakness (generalized): Secondary | ICD-10-CM | POA: Diagnosis not present

## 2021-12-22 DIAGNOSIS — I69354 Hemiplegia and hemiparesis following cerebral infarction affecting left non-dominant side: Secondary | ICD-10-CM | POA: Diagnosis not present

## 2021-12-22 DIAGNOSIS — D519 Vitamin B12 deficiency anemia, unspecified: Secondary | ICD-10-CM | POA: Diagnosis not present

## 2021-12-22 DIAGNOSIS — K219 Gastro-esophageal reflux disease without esophagitis: Secondary | ICD-10-CM | POA: Diagnosis not present

## 2021-12-22 DIAGNOSIS — H5440 Blindness, one eye, unspecified eye: Secondary | ICD-10-CM | POA: Diagnosis not present

## 2021-12-22 DIAGNOSIS — R7303 Prediabetes: Secondary | ICD-10-CM | POA: Diagnosis not present

## 2021-12-22 DIAGNOSIS — Z954 Presence of other heart-valve replacement: Secondary | ICD-10-CM | POA: Diagnosis not present

## 2021-12-22 DIAGNOSIS — E782 Mixed hyperlipidemia: Secondary | ICD-10-CM | POA: Diagnosis not present

## 2021-12-22 DIAGNOSIS — E441 Mild protein-calorie malnutrition: Secondary | ICD-10-CM | POA: Diagnosis not present

## 2021-12-22 DIAGNOSIS — I10 Essential (primary) hypertension: Secondary | ICD-10-CM | POA: Diagnosis not present

## 2021-12-22 DIAGNOSIS — I359 Nonrheumatic aortic valve disorder, unspecified: Secondary | ICD-10-CM | POA: Diagnosis not present

## 2022-01-26 ENCOUNTER — Encounter: Payer: Self-pay | Admitting: Physician Assistant

## 2022-01-26 ENCOUNTER — Encounter (HOSPITAL_COMMUNITY): Payer: Self-pay | Admitting: Adult Health

## 2022-01-26 ENCOUNTER — Inpatient Hospital Stay: Payer: Medicare Other

## 2022-01-26 ENCOUNTER — Inpatient Hospital Stay: Payer: Medicare Other | Attending: Physician Assistant | Admitting: Physician Assistant

## 2022-01-26 VITALS — BP 175/79 | HR 89 | Temp 98.5°F | Resp 18 | Wt 116.0 lb

## 2022-01-26 DIAGNOSIS — Z79899 Other long term (current) drug therapy: Secondary | ICD-10-CM | POA: Insufficient documentation

## 2022-01-26 DIAGNOSIS — D75839 Thrombocytosis, unspecified: Secondary | ICD-10-CM

## 2022-01-26 DIAGNOSIS — Z8639 Personal history of other endocrine, nutritional and metabolic disease: Secondary | ICD-10-CM | POA: Insufficient documentation

## 2022-01-26 DIAGNOSIS — Z8673 Personal history of transient ischemic attack (TIA), and cerebral infarction without residual deficits: Secondary | ICD-10-CM | POA: Diagnosis not present

## 2022-01-26 DIAGNOSIS — R300 Dysuria: Secondary | ICD-10-CM | POA: Insufficient documentation

## 2022-01-26 DIAGNOSIS — D72829 Elevated white blood cell count, unspecified: Secondary | ICD-10-CM

## 2022-01-26 DIAGNOSIS — Z7901 Long term (current) use of anticoagulants: Secondary | ICD-10-CM | POA: Insufficient documentation

## 2022-01-26 LAB — COMPREHENSIVE METABOLIC PANEL
ALT: 13 U/L (ref 0–44)
AST: 18 U/L (ref 15–41)
Albumin: 4.2 g/dL (ref 3.5–5.0)
Alkaline Phosphatase: 52 U/L (ref 38–126)
Anion gap: 8 (ref 5–15)
BUN: 32 mg/dL — ABNORMAL HIGH (ref 8–23)
CO2: 27 mmol/L (ref 22–32)
Calcium: 9.1 mg/dL (ref 8.9–10.3)
Chloride: 104 mmol/L (ref 98–111)
Creatinine, Ser: 0.89 mg/dL (ref 0.44–1.00)
GFR, Estimated: >60 mL/min (ref >60)
Glucose, Bld: 129 mg/dL — ABNORMAL HIGH (ref 70–99)
Potassium: 3.5 mmol/L (ref 3.5–5.1)
Sodium: 139 mmol/L (ref 135–145)
Total Bilirubin: 0.6 mg/dL (ref 0.3–1.2)
Total Protein: 7.9 g/dL (ref 6.5–8.1)

## 2022-01-26 LAB — CBC WITH DIFFERENTIAL/PLATELET
Abs Immature Granulocytes: 0.1 10*3/uL — ABNORMAL HIGH (ref 0.00–0.07)
Basophils Absolute: 0.2 10*3/uL — ABNORMAL HIGH (ref 0.0–0.1)
Basophils Relative: 1 %
Eosinophils Absolute: 0.2 10*3/uL (ref 0.0–0.5)
Eosinophils Relative: 1 %
HCT: 43.2 % (ref 36.0–46.0)
Hemoglobin: 13.9 g/dL (ref 12.0–15.0)
Immature Granulocytes: 1 %
Lymphocytes Relative: 10 %
Lymphs Abs: 1.6 10*3/uL (ref 0.7–4.0)
MCH: 29.5 pg (ref 26.0–34.0)
MCHC: 32.2 g/dL (ref 30.0–36.0)
MCV: 91.7 fL (ref 80.0–100.0)
Monocytes Absolute: 1.4 10*3/uL — ABNORMAL HIGH (ref 0.1–1.0)
Monocytes Relative: 8 %
Neutro Abs: 13.6 10*3/uL — ABNORMAL HIGH (ref 1.7–7.7)
Neutrophils Relative %: 79 %
Platelets: 1270 10*3/uL (ref 150–400)
RBC: 4.71 MIL/uL (ref 3.87–5.11)
RDW: 14.8 % (ref 11.5–15.5)
Smear Review: INCREASED
WBC: 17.2 10*3/uL — ABNORMAL HIGH (ref 4.0–10.5)
nRBC: 0 % (ref 0.0–0.2)

## 2022-01-26 LAB — IRON AND TIBC
Iron: 47 ug/dL (ref 28–170)
Saturation Ratios: 13 % (ref 10.4–31.8)
TIBC: 371 ug/dL (ref 250–450)
UIBC: 324 ug/dL

## 2022-01-26 LAB — C-REACTIVE PROTEIN: CRP: 0.6 mg/dL (ref <1.0)

## 2022-01-26 LAB — VITAMIN B12: Vitamin B-12: 402 pg/mL (ref 180–914)

## 2022-01-26 LAB — SEDIMENTATION RATE: Sed Rate: 8 mm/hr (ref 0–22)

## 2022-01-26 LAB — FERRITIN: Ferritin: 41 ng/mL (ref 11–307)
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
## 2022-01-27 ENCOUNTER — Encounter (HOSPITAL_COMMUNITY): Payer: Self-pay | Admitting: Adult Health

## 2022-01-27 LAB — URINE CULTURE: Culture: <10000 — AB

## 2022-01-30 ENCOUNTER — Other Ambulatory Visit: Payer: Self-pay

## 2022-01-30 LAB — METHYLMALONIC ACID, SERUM: Methylmalonic Acid, Quantitative: 272 nmol/L (ref 0–378)

## 2022-01-31 LAB — BCR-ABL1 FISH
Cells Analyzed: 200
Cells Counted: 200

## 2022-02-06 LAB — MISC LABCORP TEST (SEND OUT): Labcorp test code: 489615

## 2022-02-06 LAB — JAK2 (INCLUDING V617F AND EXON 12), MPL,& CALR W/RFL MPN PANEL (NGS)

## 2022-02-17 ENCOUNTER — Inpatient Hospital Stay: Payer: Medicare Other | Attending: Physician Assistant | Admitting: Physician Assistant

## 2022-02-17 ENCOUNTER — Ambulatory Visit: Payer: 59 | Admitting: Physician Assistant

## 2022-02-17 DIAGNOSIS — Z8639 Personal history of other endocrine, nutritional and metabolic disease: Secondary | ICD-10-CM

## 2022-02-17 DIAGNOSIS — D473 Essential (hemorrhagic) thrombocythemia: Secondary | ICD-10-CM | POA: Diagnosis not present

## 2022-02-17 MED ORDER — HYDROXYUREA 500 MG PO CAPS: 500.0000 mg | ORAL_CAPSULE | Freq: Every day | ORAL | 1 refills | Status: DC

## 2022-02-17 MED ORDER — FERROUS BISGLYCINATE CHELATE 28 MG PO CAPS: 1.0000 | ORAL_CAPSULE | Freq: Every day | ORAL | 0 refills | Status: DC

## 2022-02-17 NOTE — Progress Notes (Signed)
//  Virtual Visit via Telephone Note [facility]  I connected with Ana [name]  on 02/17/2022 at 2:00 PM by telephone and verified that I am speaking with the correct person using two identifiers.  Patient's son Ana [name] is also present and participating in virtual visit along with patient.  Location: Patient: Home Provider: [facility]   I discussed the limitations, risks, security and privacy concerns of performing an evaluation and management service by telephone and the availability of in person appointments. I also discussed with the patient that there may be a patient responsible charge related to this service. The patient expressed understanding and agreed to proceed.   REASON FOR VISIT: MPL+ essential thrombocytosis  PRIOR THERAPY: None  CURRENT THERAPY: Under work-up  INTERVAL HISTORY: Ana [name] is contacted today for follow-up of her thrombocytosis.  She was seen for initial visit by Dede Query, PA-C on 01/26/2022.    No history of DVT or PE, patient has had approximately 6 CVAs.  She denies any aquagenic pruritus, Raynaud's, erythromelalgia.  She has dizziness.  She has had night sweats for the past 2 months.  Reports nausea, early satiety, decreased appetite, and fatigue.  She takes Coumadin for history of AVR.  Denies any prior blood per rectum or melena.  She has headaches.  No chest pain, dyspnea on exertion, lightheadedness, or passing out.  She is not currently taking any iron supplementation.  Energy reported 20% with 60-70% appetite.    OBSERVATIONS/OBJECTIVE: Review of Systems  Constitutional:  Positive for diaphoresis and malaise/fatigue. Negative for chills, fever and weight loss.  Respiratory:  Negative for cough and shortness of breath.   Cardiovascular:  Negative for chest pain and palpitations.  Gastrointestinal:  Positive for nausea. Negative for abdominal pain, blood in stool, melena and vomiting.  Neurological:  Positive  for dizziness and headaches.     PHYSICAL EXAM (per limitations of virtual telephone visit): The patient is alert and oriented x 3, exhibiting adequate mentation, good mood, and ability to speak in full sentences and execute sound judgement.   ASSESSMENT & PLAN: 1.  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 - ESR and CRP normal - History of multiple CVA.  No MI, DVT, or PE. - Most recent CBC (01/26/2022): WBC 17.2, platelets 1270.  Differential shows ANC 13.6, monocytes 1.4, basophil 0.2 - Reports night sweats for the past 2 months.  Reports nausea, early satiety, decreased appetite, and fatigue. - Discussed diagnosis of myeloproliferative neoplasm, with differential diagnosis also including prefibrotic primary myelofibrosis (especially considering that the majority of routinely-assigned MPL-mutated ET probably represents prefibrotic PMF) - Patient would benefit from cytoreductive therapy (first-line Hydrea) given age and history of multiple CVAs - She is on warfarin therapy due to mechanical AVR - PLAN: Start patient on Hydrea 500 mg daily - Labs in 4 weeks (CBC, CMP, LDH, uric acid) with same-day office visit - Deferred discussion of risks/benefits of bone marrow biopsy until in person visit in 4 weeks.  We will also consider checking abdominal imaging to evaluate for splenomegaly or lymphadenopathy.  2.  Iron deficiency - Labs from 01/26/2022 show borderline ferritin 41 with iron saturation 13% - Denies any bright red blood per rectum or melena - Was previously taking  iron supplementation - PLAN: Recommend oral iron supplementation with ferrous bisglycinate once daily (patient has underlying  constipation/diarrhea) - We will recheck levels in about 3 months (January 2024) - We will check stool cards at in-person visit next month  3.  History of vitamin B12 deficiency - Most recent labs with normal vitamin B12 402 and normal methylmalonic acid    4.  Other history - Patient is chronically bed bound secondary to multiple strokes.  She alternates staying with her son and daughter, who share care giving roles for her. 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 The patient was provided an opportunity to ask questions and all were answered. The patient agreed with the plan and demonstrated an understanding of the instructions.   The patient was advised to call back or seek an in-person evaluation if the symptoms worsen or if the condition fails to improve as anticipated.  I provided 35 minutes of non-face-to-face time during this encounter.   Harriett Rush, PA-C 02/18/2022 12:29 AM

## 2022-02-17 NOTE — Progress Notes (Incomplete)
//    Virtual Visit via Telephone Note [facility]  I connected with [name]  on 02/17/2022 at 2:00 PM by telephone and verified that I am speaking with the correct person using two identifiers.  Patient's son Ana [name] is also present and participating in virtual visit along with patient.  Location: Patient: Home Provider: [facility]   I discussed the limitations, risks, security and privacy concerns of performing an evaluation and management service by telephone and the availability of in person appointments. I also discussed with the patient that there may be a patient responsible charge related to this service. The patient expressed understanding and agreed to proceed.   REASON FOR VISIT: MPL+ essential thrombocytosis  PRIOR THERAPY: None  CURRENT THERAPY: Under work-up  INTERVAL HISTORY: [name] is contacted today for follow-up of her thrombocytosis.  She was seen for initial visit by Dede Query, PA-C on 01/26/2022.    at today's visit, she reports feeling ***.  *** CURRENT RESIDENCE --> with family  *** Blood clots - STROKE 1 YEARS AGO (total of about 6 strokes) - legally blind *** Aquagenic pruritus, Raynaud's, erythromelalgia (itching/burning pain) *** worsening mental status, decreased memory comes and goes *** Vasomotor symptoms(dizziness, tinnitus, blurry vision, strokelike symptoms, neuropathy) *** B symptoms - night sweats x 2 month *** Abdominal pain (LUQ), nausea, early satiety, decreased appetite  *** Bleeding - COUMADIN *** Fatigue *** Pica, RLS, headaches *** CP, DOE, LH, syncope  *** Iron tablets?? - NO    OBSERVATIONS/OBJECTIVE: ROS ***  PHYSICAL EXAM (per limitations of virtual telephone visit): The patient is alert and oriented x 3, exhibiting adequate mentation, good mood, and ability to speak in full sentences and execute sound judgement.   ASSESSMENT & PLAN: 1.  MPL positive essential thrombocytosis - Chronic  intermittent thrombocytosis and leukocytosis with negative MPN work-up in 2018 - Reestablished care with hematology clinic in 2023 due to significant increase in baseline platelet counts from 600-700K to > 1200K/uL - Hematology work-up (01/26/2022) revealed positive MPL mutation. 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  Iron deficiency - *** - PLAN: ***  ***.  *** - *** - PLAN: ***    3.  Other history - Patient is chronically bed bound secondary to multiple strokes.  She alternates staying with her son and daughter, who share care giving roles for her. - PLAN: ***   FOLLOW UP INSTRUCTIONS: ***    I discussed the assessment and treatment plan with the patient. The patient was provided an opportunity to ask questions and all were answered. The patient agreed with the plan and demonstrated an understanding of the instructions.   The patient was advised to call back or seek an in-person evaluation if the symptoms worsen or if the condition fails to improve as anticipated.  I provided *** minutes of non-face-to-face time during this encounter.   Harriett Rush, PA-C ***

## 2022-02-18 ENCOUNTER — Encounter (HOSPITAL_COMMUNITY): Payer: Self-pay | Admitting: Adult Health

## 2022-02-18 ENCOUNTER — Other Ambulatory Visit: Payer: Self-pay | Admitting: Physician Assistant

## 2022-02-18 DIAGNOSIS — D473 Essential (hemorrhagic) thrombocythemia: Secondary | ICD-10-CM

## 2022-02-18 NOTE — Patient Instructions (Signed)
[facility] **   You were evaluated (via telemedicine visit) today by Tarri Abernethy PA-C for your follow-up visit.    ELEVATED PLATELETS & WHITE BLOOD CELLS Your platelets and white blood cells are elevated due to a mutation in your DNA (MPL mutation) that causes your bone marrow to make too many blood cells. This is a type of low-grade blood cancer.  It has the potential to progress into other more serious blood diseases (including myelofibrosis). I recommend treatment with a medication called hydroxyurea (Hydrea) to decrease the number of platelets and white blood cells. Please see the attached handout for important information regarding the side effects of Hydrea.  IRON DEFICIENCY Start taking iron tablet once daily. I recommend "FERROUS BISGLYCINATE," since this is a gentler and more easily tolerated type of iron.  LABS: Return in 4 weeks for labs  FOLLOW-UP APPOINTMENT: Office visit in 4 weeks, same day as labs  ** Thank you for trusting me with your healthcare!  I strive to provide all of my patients with quality care at each visit.  If you receive a survey for this visit, I would be so grateful to you for taking the time to provide feedback.  Thank you in advance!  ~ Naia Ruff                   Dr. Derek Jack   &   Tarri Abernethy, PA-C   - - - - - - - - - - - - - - - - - -    Thank you for choosing [facility] to provide your oncology and hematology care.  To afford each patient quality time with our provider, please arrive at least 15 minutes before your scheduled appointment time.   If you have a lab appointment with the [facility] please come in thru the Main Entrance and check in at the main information desk.  You need to re-schedule your appointment should you arrive 10 or more minutes late.  We strive to give you quality time with our  providers, and arriving late affects you and other patients whose appointments are after yours.  Also, if you no show three or more times for appointments you may be dismissed from the clinic at the providers discretion.     Again, thank you for choosing [facility].  Our hope is that these requests will decrease the amount of time that you wait before being seen by our physicians.       _____________________________________________________________  Should you have questions after your visit to [facility], please contact our office at [phone] and follow the prompts.  Our office hours are 8:00 a.m. and 4:30 p.m. Monday - Friday.  Please note that voicemails left after 4:00 p.m. may not be returned until the following business day.  We are closed weekends and major holidays.  You do have access to a nurse 24-7, just call the main number to the clinic [phone] and do not press any options, hold on the line and a nurse will answer the phone.    For prescription refill requests, have your pharmacy contact our office and allow 72 hours.

## 2022-02-22 ENCOUNTER — Telehealth: Payer: Self-pay | Admitting: Physician Assistant

## 2022-02-22 NOTE — Telephone Encounter (Signed)
Attempted to call patient's son to discuss possible additional testing for the patient.  No answer, left nondetailed voice message requesting callback from patient's son [name]).
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
## 2022-03-20 ENCOUNTER — Inpatient Hospital Stay: Payer: Medicare Other

## 2022-03-20 ENCOUNTER — Ambulatory Visit: Payer: 59 | Admitting: Physician Assistant

## 2022-04-21 ENCOUNTER — Encounter (HOSPITAL_COMMUNITY): Payer: Self-pay | Admitting: Adult Health

## 2022-06-22 ENCOUNTER — Encounter (HOSPITAL_COMMUNITY): Payer: Self-pay | Admitting: Adult Health

## 2022-12-29 ENCOUNTER — Other Ambulatory Visit: Payer: Self-pay

## 2022-12-29 ENCOUNTER — Emergency Department (HOSPITAL_COMMUNITY)
Admission: EM | Admit: 2022-12-29 | Discharge: 2023-01-14 | Disposition: E | Payer: Medicare Other | Source: Home / Self Care | Attending: Emergency Medicine | Admitting: Emergency Medicine

## 2022-12-29 DIAGNOSIS — I469 Cardiac arrest, cause unspecified: Secondary | ICD-10-CM | POA: Diagnosis not present

## 2022-12-29 DIAGNOSIS — Z7901 Long term (current) use of anticoagulants: Secondary | ICD-10-CM | POA: Diagnosis not present

## 2022-12-29 DIAGNOSIS — R092 Respiratory arrest: Secondary | ICD-10-CM | POA: Diagnosis not present

## 2022-12-29 MED ORDER — EPINEPHRINE 1 MG/10ML IJ SOSY
1.0000 mg | PREFILLED_SYRINGE | Freq: Once | INTRAMUSCULAR | Status: AC
  Administered 2022-12-29: 1 mg via INTRAVENOUS

## 2022-12-29 MED ORDER — SODIUM BICARBONATE 8.4 % IV SOLN
50.0000 meq | Freq: Once | INTRAVENOUS | Status: AC
  Administered 2022-12-29: 50 meq via INTRAVENOUS

## 2022-12-29 MED ORDER — EPINEPHRINE 1 MG/10ML IJ SOSY: 1.0000 mg | PREFILLED_SYRINGE | Freq: Once | INTRAMUSCULAR | Status: DC

## 2022-12-29 MED ORDER — SODIUM BICARBONATE 8.4 % IV SOLN: 50.0000 meq | Freq: Once | INTRAVENOUS | Status: DC

## 2023-01-14 NOTE — ED Notes (Signed)
Around 0049 CPR was initiated on pt by NT upon arriving in ED. Loma Sender, NT

## 2023-01-14 NOTE — ED Notes (Signed)
Pt taken to the morgue by security.

## 2023-01-14 NOTE — ED Notes (Signed)
[employer] officers present inquiring about pt's death

## 2023-01-14 NOTE — ED Notes (Signed)
Around 22 this RN got a phone call from registration stating that there was an unconscious person that needed help getting out of the car. This RN responded and brought Melissa RN up front to assess scene and pt. Upon arriving to front doors, pt was in wheelchair and appeared to be deceased. This RN and Engineer, water took over and observed pt was not breathing, cold to the touch and no pulse was felt. Pt was then rolled into ED where she was placed on bed and CPR was started immediately.

## 2023-01-14 NOTE — ED Triage Notes (Signed)
Pt arrived  by POV 0050. Pt was unresponsive, apneic, pulseless, and cyanotic. CPR immediately initiated due to lack of information. Ana [name]

## 2023-01-14 NOTE — ED Notes (Signed)
[facility] contacted this RN, Koleen Nimrod, regarding pt. Koleen Nimrod notified this RN that he needed to contact his supervisor and get back to this RN

## 2023-01-14 NOTE — ED Notes (Signed)
Detectives at bedside. This RN Grover Canavan, and this writer accompanied them. Ana [name]

## 2023-01-14 NOTE — ED Notes (Addendum)
After TOD this RN spoke with son and daughter  about events leading up to patient's arrival to ED. Per son and daughter, patient had been short of breath today and having lots of diarrhea. He reports that they have gotten her a new apartment in [place] that would be more comfortable for her and tonight they were taking her there tonight. They realized she had worsening breathing and brought her to [facility]. This RN inquired about if patient didn't want them to call EMS and they reported they didn't know if they had to wait on them (EMS) and that it would be faster for them to get her here in the car. PT has a large foul smelling wound to mid back and sacral wound. Per son, the wounds have only been there 2 weeks. He reports that patient's PCP is Dwana Melena and son reports that him and his sister care for patient at home. Kellogg RN

## 2023-01-14 NOTE — ED Notes (Signed)
[facility] called regarding pt. Awaiting a call back

## 2023-01-14 NOTE — ED Provider Notes (Signed)
[facility] AT [facility] Provider Note   CSN: [id number] Arrival date & time: 01/10/2023  4098     History  No chief complaint on file.   Ana [name] is a 77 y.o. female.  Patient brought in by family secondary to decreased responsiveness.  Apparently patient has multiple chronic illnesses and has stated that she is ready to die and has not received any treatment for her illnesses recently.   The daughter and son live with her And they were  planning on moving her.  Sounds like earlier today she was having some abnormal breathing and decreased responsiveness.  They cleaned her up and were doing a couple other things but then tonight the notes that she was not breathing and was not responsive so they brought her here.  On arrival here patient was pulseless and apneic.        Home Medications Prior to Admission medications   Medication Sig Start Date End Date Taking? Authorizing Provider  acetaminophen (TYLENOL) 500 MG tablet Take 1 tablet (500 mg total) by mouth every 8 (eight) hours as needed for mild pain or moderate pain. 03/08/20   Noralee Stain, DO  amLODipine (NORVASC) 10 MG tablet Take 10 mg by mouth every evening.     [provider]  Ferrous Bisglycinate Chelate 28 MG CAPS Take 1 capsule by mouth daily with breakfast. 02/17/22   Pennington, Lupe Carney M, PA-C  hydroxyurea (HYDREA) 500 MG capsule TAKE 1 CAPSULE(500 MG) BY MOUTH DAILY. MAY TAKE WITH FOOD TO MINIMIZE GI SIDE EFFECTS 06/22/22   Rojelio Brenner M, PA-C  pantoprazole (PROTONIX) 40 MG tablet Take 1 tablet (40 mg total) by mouth daily. 01/03/21 02/02/21  Kendell Bane, MD  warfarin (COUMADIN) 2.5 MG tablet Take 1 tablet (2.5 mg total) by mouth one time only at 4 PM. 01/04/21   Kendell Bane, MD  warfarin (COUMADIN) 5 MG tablet Take 1 tablet (5 mg total) by mouth one time only at 4 PM. 01/02/21 02/01/21  Kendell Bane, MD      Allergies    Blood-group specific  substance    Review of Systems   Review of Systems  Physical Exam Updated Vital Signs There were no vitals taken for this visit. Physical Exam Constitutional:      Comments: Cachectic and chronically ill-appearing  Eyes:     Comments: Left eye enucleation right eye fixed and dilated at approximately 7 mm  Cardiovascular:     Comments: Pulseless Pulmonary:     Comments: Apneic Abdominal:     Comments: Cachectic  Skin:    Coloration: Skin is pale.     Comments: Vito appearing rash on right and left pelvis, skin ulcers in the sacral area.     ED Results / Procedures / Treatments   Labs (all labs ordered are listed, but only abnormal results are displayed) Labs Reviewed - No data to display  EKG None  Radiology No results found.  Procedures .Critical Care  Performed by: Marily Memos, MD Authorized by: Marily Memos, MD   Critical care provider statement:    Critical care time (minutes):  30   Critical care was necessary to treat or prevent imminent or life-threatening deterioration of the following conditions:  Cardiac failure, respiratory failure, CNS failure or compromise and dehydration   Critical care was time spent personally by me on the following activities:  Development of treatment plan with patient or surrogate, discussions with consultants, evaluation of patient's response  to treatment, examination of patient, ordering and review of laboratory studies, ordering and review of radiographic studies, ordering and performing treatments and interventions, pulse oximetry, re-evaluation of patient's condition and review of old charts CPR  Date/Time: 12/30/2022 1:19 AM  Performed by: Marily Memos, MD Authorized by: Marily Memos, MD  CPR Procedure Details:    CPR/ACLS performed in the ED: Yes     Duration of CPR (minutes):  35   Outcome: Pt declared dead    CPR performed via ACLS guidelines under my direct supervision.  See RN documentation for details including  defibrillator use, medications, doses and timing. Procedure Name: Intubation Date/Time: 12/30/2022 1:19 AM  Performed by: Marily Memos, MDPre-anesthesia Checklist: Patient identified, Patient being monitored, Emergency Drugs available, Timeout performed and Suction available Oxygen Delivery Method: Ambu bag Preoxygenation: Pre-oxygenation with 100% oxygen Induction Type: Rapid sequence Ventilation: Mask ventilation without difficulty Laryngoscope Size: Glidescope Tube size: 7.0 mm Number of attempts: 1 Placement Confirmation: ETT inserted through vocal cords under direct vision, CO2 detector and Breath sounds checked- equal and bilateral Secured at: 22 cm Tube secured with: ETT holder Dental Injury: Teeth and Oropharynx as per pre-operative assessment         Medications Ordered in ED Medications - No data to display  ED Course/ Medical Decision Making/ A&P                                 Medical Decision Making  When intubating there was purulence in the airway.  With her decline throughout the day and respiratory abnormality earlier in the day I suspect she probably had pneumonia that led to this although I cannot be sure.  Patient received CPR immediately upon arrival however am not sure how long she was pulseless prior to getting here which likely limited our resuscitation efforts.  She was intubated and IVs obtained very quickly on entering the resuscitation room.  Epi and bicarb and fluids were given at appropriate intervals.  Multiple pulse checks.  Performed CPR for 30 to 35 minutes, about all of that time was with her airway in place and was never able to obtain ROSC.  She would always be in PEA.  Multiple talks with the family and ultimately made the decision that it was futile to continue resuscitative efforts and patient was pronounced dead at 0113. Patient will not be a ME case.   Final Clinical Impression(s) / ED Diagnoses Final diagnoses:  Respiratory arrest [facility])     Rx / DC Orders ED Discharge Orders     None         Tevis Dunavan, Barbara Cower, MD 12/27/2022 [phone]

## 2023-01-14 NOTE — ED Provider Notes (Signed)
Epinephrine and bicarb orders were not entered close enough to the time they were administered so the times on them are incorrect but they are the doses given during the code/resuscitation as per nursing note.   Abel Ra, Barbara Cower, MD 01/06/2023 [phone]

## 2023-01-14 NOTE — ED Notes (Signed)
Koleen Nimrod called this RN back to notify this RN that since pt was deceased that pt's situation would fall into the hands of law enforcement

## 2023-01-14 NOTE — ED Notes (Addendum)
0050 CPR initiated.  Pt was brought to room ED1 and placed on Zoll and CPR resumed and patient was bagged.  Respiratory and EDP, and nursing staff at bedside.  IV established 18 R AC.  0052:  1 of epi given  0053: Bicarb given  0055: Intubated  0056: pulse check  and is in asystole  CPR resumed  0057: 1 of epi given  0100: pulse check - no pulse    CPR resumed  Family at bedside and EDP Mesner is explaining situation to them.  0101: Epi given 0102 Bicarb given  0105 pulse\/rhythm check: in  Asystole  CPR resumed  0107 another epi given  0110: pulse/ rhythm check and in asystole.  Time of death is 0113 date 2023/01/12.   Kellogg RN

## 2023-01-14 DEATH — deceased
